# Patient Record
Sex: Female | Born: 1935 | Race: Black or African American | Hispanic: No | Marital: Single | Smoking: Never smoker
Health system: Southern US, Community
[De-identification: ages and names within clinical notes are randomized; demographics above are authoritative.]

## PROBLEM LIST (undated history)

## (undated) DIAGNOSIS — E7800 Pure hypercholesterolemia, unspecified: Secondary | ICD-10-CM

## (undated) DIAGNOSIS — N189 Chronic kidney disease, unspecified: Secondary | ICD-10-CM

## (undated) DIAGNOSIS — E119 Type 2 diabetes mellitus without complications: Secondary | ICD-10-CM

## (undated) DIAGNOSIS — I10 Essential (primary) hypertension: Secondary | ICD-10-CM

## (undated) DIAGNOSIS — M109 Gout, unspecified: Secondary | ICD-10-CM

## (undated) HISTORY — DX: Gout, unspecified: M10.9

## (undated) HISTORY — DX: Chronic kidney disease, unspecified: N18.9

## (undated) HISTORY — PX: TOTAL KNEE ARTHROPLASTY: SHX125

## (undated) HISTORY — PX: KNEE SURGERY: SHX244

---

## 2001-09-02 ENCOUNTER — Ambulatory Visit (HOSPITAL_COMMUNITY): Admission: RE | Admit: 2001-09-02 | Discharge: 2001-09-02 | Payer: Self-pay | Admitting: Ophthalmology

## 2003-10-15 ENCOUNTER — Inpatient Hospital Stay (HOSPITAL_COMMUNITY): Admission: RE | Admit: 2003-10-15 | Discharge: 2003-10-19 | Payer: Self-pay | Admitting: Orthopedic Surgery

## 2003-10-15 IMAGING — CR DG KNEE 1-2V PORT*L*
2 series · 2 of 2 positions shown · non-contrast
Comparison: none

CLINICAL DATA: Osteoarthritis of the left knee. Total knee replacement.

AP AND LATERAL PORTABLE VIEWS OF THE LEFT KNEE [DATE]

[view not recorded (1 of 2)]
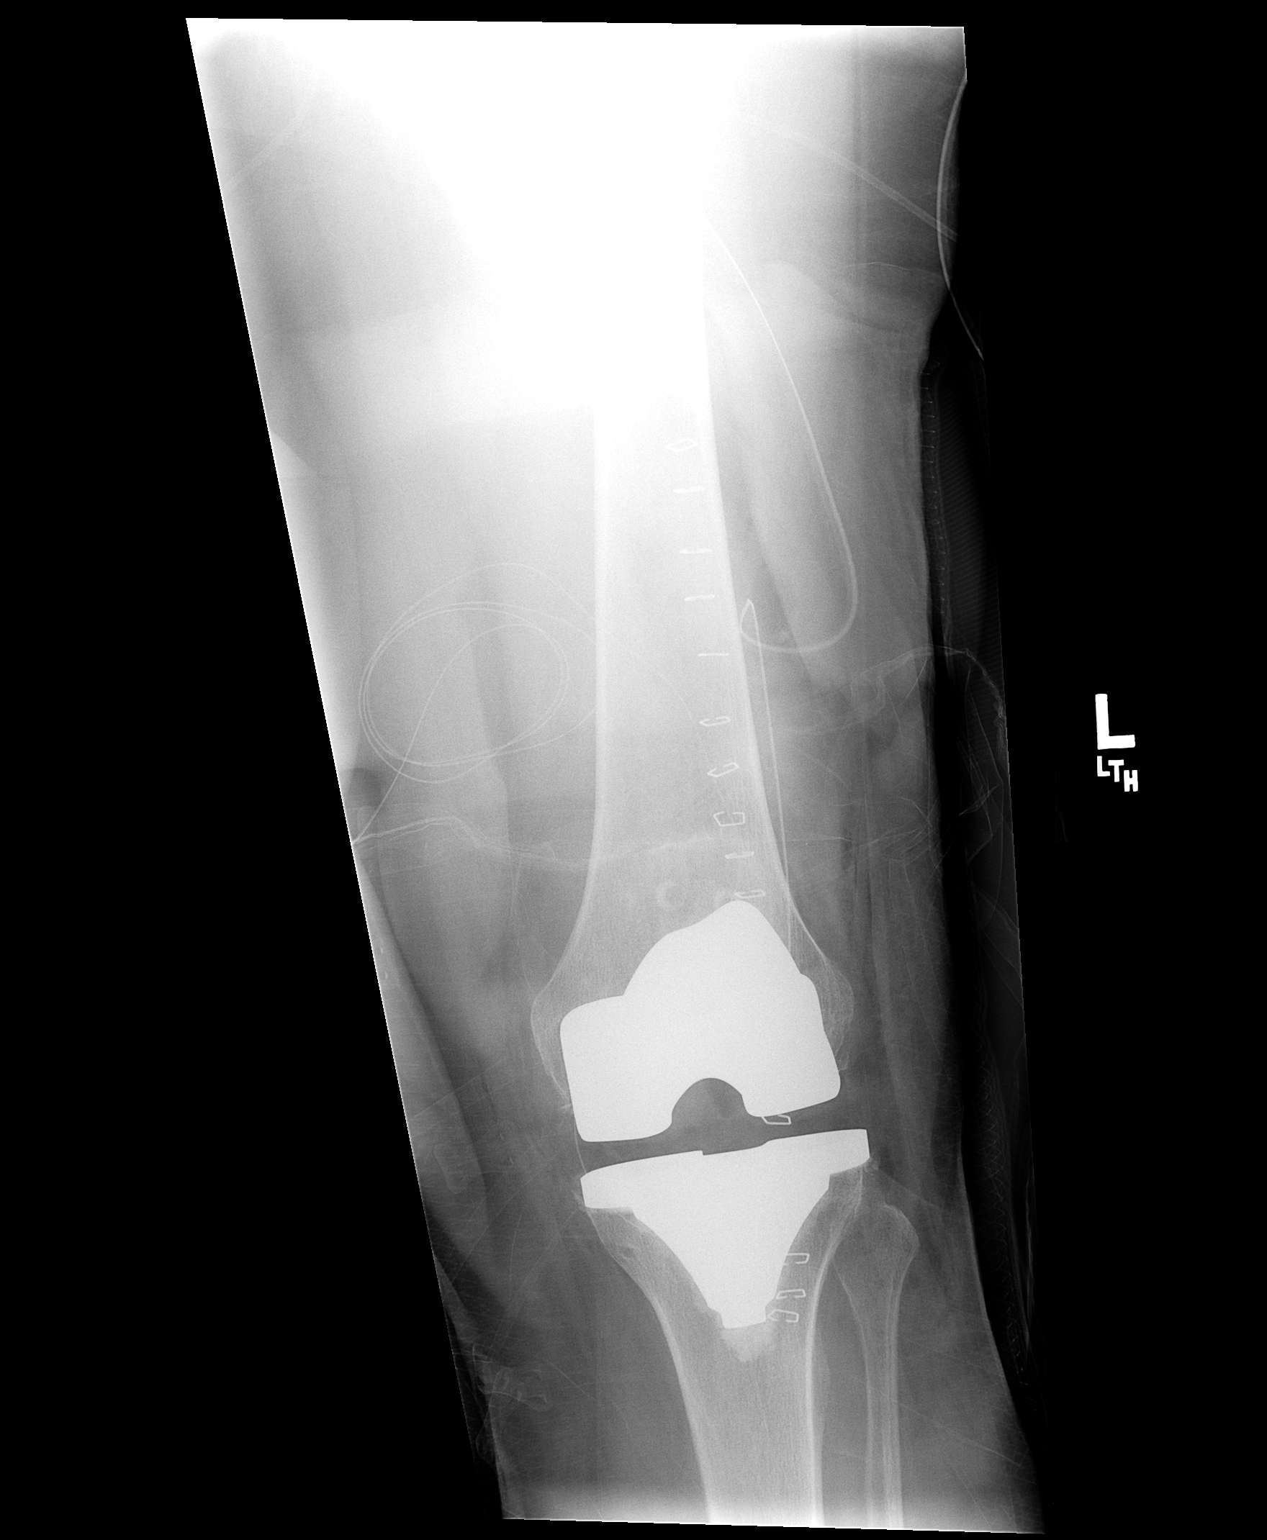

[view not recorded (2 of 2)]
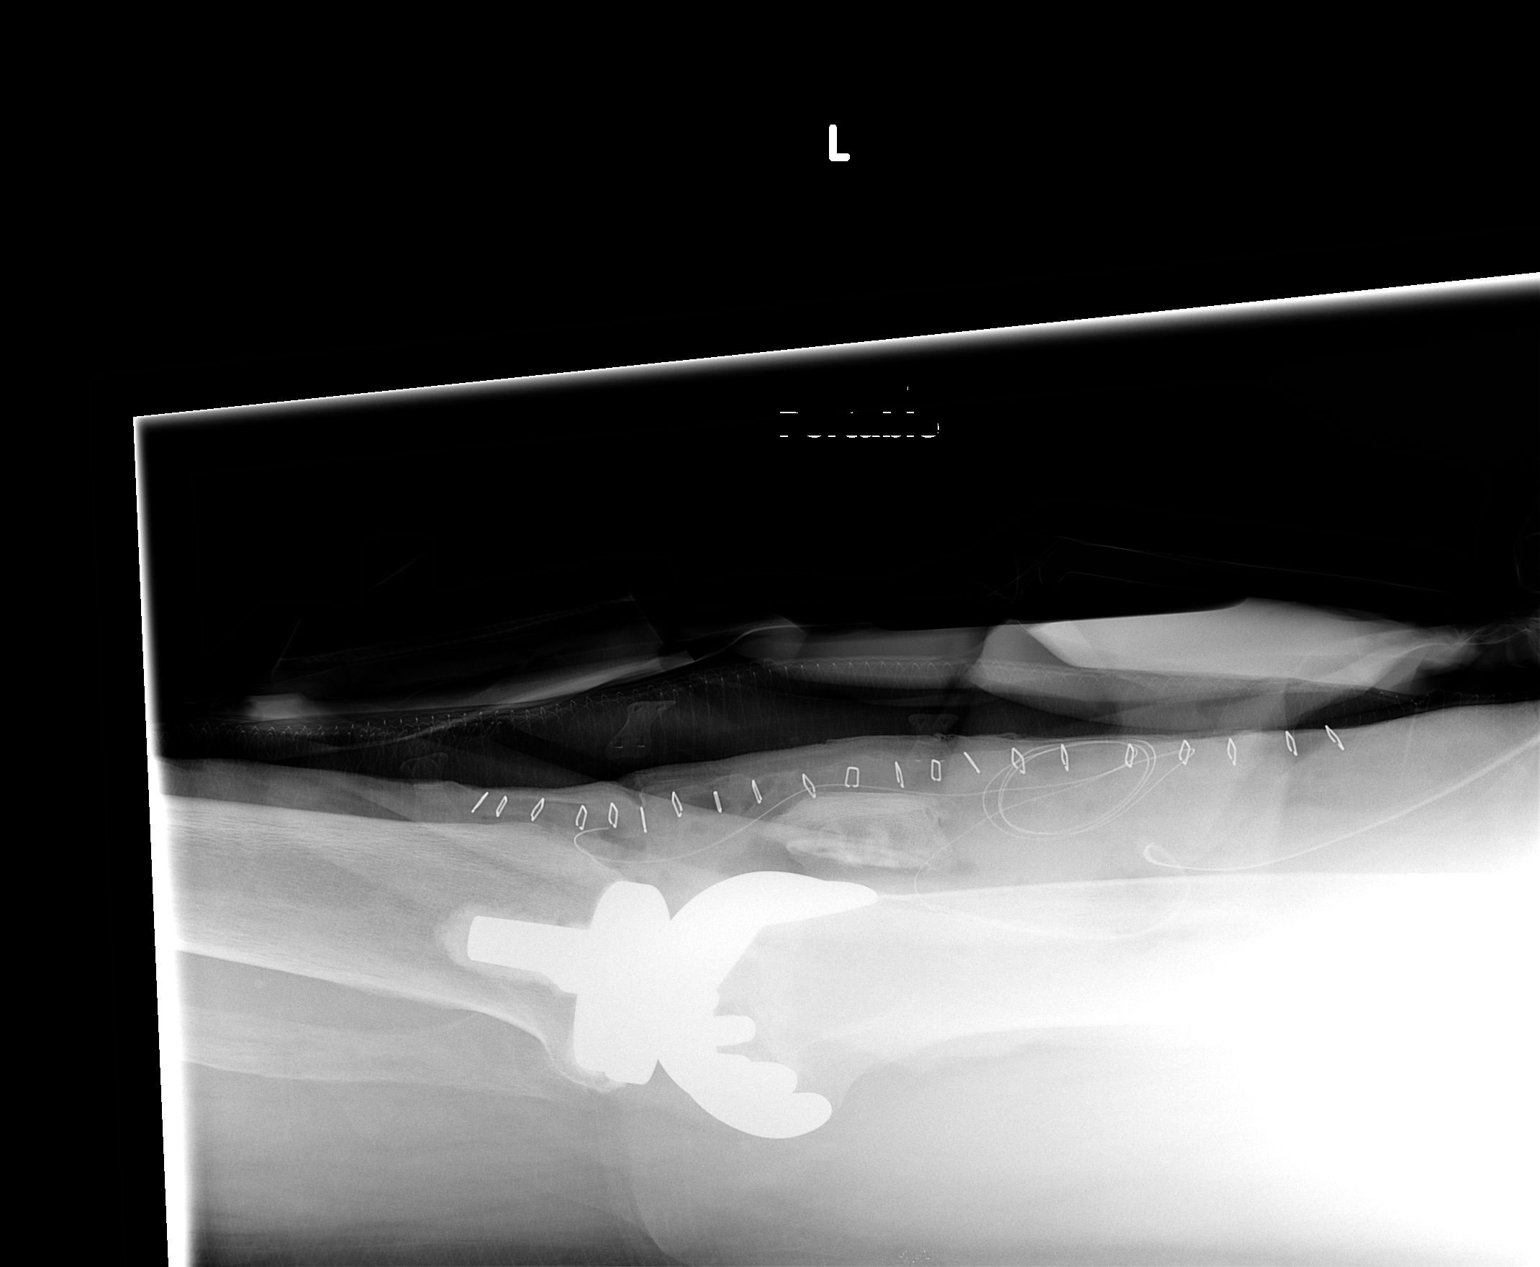

[2 of 2 positions shown; findings below may reference images not displayed]

FINDINGS: The components of the total knee replacement appear in good position with anatomic
alignment. Drains are in place.

IMPRESSION

Satisfactory postoperative appearance of the left knee after total knee replacement.

## 2003-11-25 ENCOUNTER — Ambulatory Visit: Payer: Self-pay | Admitting: Orthopedic Surgery

## 2004-01-06 ENCOUNTER — Ambulatory Visit: Payer: Self-pay | Admitting: Orthopedic Surgery

## 2004-03-30 ENCOUNTER — Ambulatory Visit: Payer: Self-pay | Admitting: Orthopedic Surgery

## 2004-05-29 ENCOUNTER — Ambulatory Visit (HOSPITAL_COMMUNITY): Admission: RE | Admit: 2004-05-29 | Discharge: 2004-05-29 | Payer: Self-pay | Admitting: Internal Medicine

## 2004-09-25 ENCOUNTER — Ambulatory Visit: Payer: Self-pay | Admitting: Orthopedic Surgery

## 2005-06-26 ENCOUNTER — Ambulatory Visit (HOSPITAL_COMMUNITY): Admission: RE | Admit: 2005-06-26 | Discharge: 2005-06-26 | Payer: Self-pay | Admitting: Ophthalmology

## 2014-01-27 DIAGNOSIS — E119 Type 2 diabetes mellitus without complications: Secondary | ICD-10-CM | POA: Diagnosis not present

## 2014-01-27 DIAGNOSIS — Z79899 Other long term (current) drug therapy: Secondary | ICD-10-CM | POA: Diagnosis not present

## 2014-02-02 DIAGNOSIS — H4011X1 Primary open-angle glaucoma, mild stage: Secondary | ICD-10-CM | POA: Diagnosis not present

## 2014-02-03 DIAGNOSIS — I10 Essential (primary) hypertension: Secondary | ICD-10-CM | POA: Diagnosis not present

## 2014-02-03 DIAGNOSIS — N183 Chronic kidney disease, stage 3 (moderate): Secondary | ICD-10-CM | POA: Diagnosis not present

## 2014-02-03 DIAGNOSIS — E1129 Type 2 diabetes mellitus with other diabetic kidney complication: Secondary | ICD-10-CM | POA: Diagnosis not present

## 2014-02-19 DIAGNOSIS — E1142 Type 2 diabetes mellitus with diabetic polyneuropathy: Secondary | ICD-10-CM | POA: Diagnosis not present

## 2014-02-19 DIAGNOSIS — B351 Tinea unguium: Secondary | ICD-10-CM | POA: Diagnosis not present

## 2014-02-19 DIAGNOSIS — L851 Acquired keratosis [keratoderma] palmaris et plantaris: Secondary | ICD-10-CM | POA: Diagnosis not present

## 2014-04-28 DIAGNOSIS — N189 Chronic kidney disease, unspecified: Secondary | ICD-10-CM | POA: Diagnosis not present

## 2014-04-28 DIAGNOSIS — I10 Essential (primary) hypertension: Secondary | ICD-10-CM | POA: Diagnosis not present

## 2014-04-28 DIAGNOSIS — Z79899 Other long term (current) drug therapy: Secondary | ICD-10-CM | POA: Diagnosis not present

## 2014-04-28 DIAGNOSIS — E119 Type 2 diabetes mellitus without complications: Secondary | ICD-10-CM | POA: Diagnosis not present

## 2014-04-28 DIAGNOSIS — M109 Gout, unspecified: Secondary | ICD-10-CM | POA: Diagnosis not present

## 2014-05-04 DIAGNOSIS — H4011X1 Primary open-angle glaucoma, mild stage: Secondary | ICD-10-CM | POA: Diagnosis not present

## 2014-05-07 ENCOUNTER — Other Ambulatory Visit (HOSPITAL_COMMUNITY): Payer: Self-pay | Admitting: Podiatry

## 2014-05-07 DIAGNOSIS — E1142 Type 2 diabetes mellitus with diabetic polyneuropathy: Secondary | ICD-10-CM | POA: Diagnosis not present

## 2014-05-07 DIAGNOSIS — R0989 Other specified symptoms and signs involving the circulatory and respiratory systems: Secondary | ICD-10-CM

## 2014-05-07 DIAGNOSIS — L851 Acquired keratosis [keratoderma] palmaris et plantaris: Secondary | ICD-10-CM | POA: Diagnosis not present

## 2014-05-07 DIAGNOSIS — B351 Tinea unguium: Secondary | ICD-10-CM | POA: Diagnosis not present

## 2014-05-07 DIAGNOSIS — B353 Tinea pedis: Secondary | ICD-10-CM | POA: Diagnosis not present

## 2014-05-11 ENCOUNTER — Ambulatory Visit (HOSPITAL_COMMUNITY)
Admission: RE | Admit: 2014-05-11 | Discharge: 2014-05-11 | Disposition: A | Discharge status: Home / Self Care | Payer: Medicare Other | Source: Ambulatory Visit | Attending: Podiatry | Admitting: Podiatry

## 2014-05-11 DIAGNOSIS — M79672 Pain in left foot: Secondary | ICD-10-CM | POA: Diagnosis not present

## 2014-05-11 DIAGNOSIS — M79604 Pain in right leg: Secondary | ICD-10-CM | POA: Diagnosis not present

## 2014-05-11 DIAGNOSIS — R0989 Other specified symptoms and signs involving the circulatory and respiratory systems: Secondary | ICD-10-CM | POA: Diagnosis not present

## 2014-05-11 DIAGNOSIS — E119 Type 2 diabetes mellitus without complications: Secondary | ICD-10-CM | POA: Diagnosis not present

## 2014-05-11 DIAGNOSIS — M79661 Pain in right lower leg: Secondary | ICD-10-CM | POA: Diagnosis not present

## 2014-05-11 DIAGNOSIS — M79605 Pain in left leg: Secondary | ICD-10-CM | POA: Diagnosis not present

## 2014-05-11 DIAGNOSIS — I10 Essential (primary) hypertension: Secondary | ICD-10-CM | POA: Insufficient documentation

## 2014-05-11 DIAGNOSIS — M79671 Pain in right foot: Secondary | ICD-10-CM | POA: Diagnosis not present

## 2014-05-11 DIAGNOSIS — M25561 Pain in right knee: Secondary | ICD-10-CM | POA: Diagnosis not present

## 2014-05-12 DIAGNOSIS — N183 Chronic kidney disease, stage 3 (moderate): Secondary | ICD-10-CM | POA: Diagnosis not present

## 2014-05-12 DIAGNOSIS — I10 Essential (primary) hypertension: Secondary | ICD-10-CM | POA: Diagnosis not present

## 2014-05-12 DIAGNOSIS — E1129 Type 2 diabetes mellitus with other diabetic kidney complication: Secondary | ICD-10-CM | POA: Diagnosis not present

## 2014-07-16 DIAGNOSIS — L851 Acquired keratosis [keratoderma] palmaris et plantaris: Secondary | ICD-10-CM | POA: Diagnosis not present

## 2014-07-16 DIAGNOSIS — B351 Tinea unguium: Secondary | ICD-10-CM | POA: Diagnosis not present

## 2014-07-16 DIAGNOSIS — E1142 Type 2 diabetes mellitus with diabetic polyneuropathy: Secondary | ICD-10-CM | POA: Diagnosis not present

## 2014-08-03 DIAGNOSIS — H4011X1 Primary open-angle glaucoma, mild stage: Secondary | ICD-10-CM | POA: Diagnosis not present

## 2014-08-03 DIAGNOSIS — E119 Type 2 diabetes mellitus without complications: Secondary | ICD-10-CM | POA: Diagnosis not present

## 2014-08-11 DIAGNOSIS — E119 Type 2 diabetes mellitus without complications: Secondary | ICD-10-CM | POA: Diagnosis not present

## 2014-08-11 DIAGNOSIS — E785 Hyperlipidemia, unspecified: Secondary | ICD-10-CM | POA: Diagnosis not present

## 2014-08-11 DIAGNOSIS — I10 Essential (primary) hypertension: Secondary | ICD-10-CM | POA: Diagnosis not present

## 2014-08-11 DIAGNOSIS — N183 Chronic kidney disease, stage 3 (moderate): Secondary | ICD-10-CM | POA: Diagnosis not present

## 2014-08-18 DIAGNOSIS — N183 Chronic kidney disease, stage 3 (moderate): Secondary | ICD-10-CM | POA: Diagnosis not present

## 2014-08-18 DIAGNOSIS — I10 Essential (primary) hypertension: Secondary | ICD-10-CM | POA: Diagnosis not present

## 2014-08-18 DIAGNOSIS — Z6830 Body mass index (BMI) 30.0-30.9, adult: Secondary | ICD-10-CM | POA: Diagnosis not present

## 2014-08-18 DIAGNOSIS — E119 Type 2 diabetes mellitus without complications: Secondary | ICD-10-CM | POA: Diagnosis not present

## 2014-09-24 DIAGNOSIS — L851 Acquired keratosis [keratoderma] palmaris et plantaris: Secondary | ICD-10-CM | POA: Diagnosis not present

## 2014-09-24 DIAGNOSIS — E1142 Type 2 diabetes mellitus with diabetic polyneuropathy: Secondary | ICD-10-CM | POA: Diagnosis not present

## 2014-09-24 DIAGNOSIS — B351 Tinea unguium: Secondary | ICD-10-CM | POA: Diagnosis not present

## 2014-11-02 DIAGNOSIS — H401131 Primary open-angle glaucoma, bilateral, mild stage: Secondary | ICD-10-CM | POA: Diagnosis not present

## 2014-11-17 DIAGNOSIS — Z79899 Other long term (current) drug therapy: Secondary | ICD-10-CM | POA: Diagnosis not present

## 2014-11-17 DIAGNOSIS — M109 Gout, unspecified: Secondary | ICD-10-CM | POA: Diagnosis not present

## 2014-11-17 DIAGNOSIS — E119 Type 2 diabetes mellitus without complications: Secondary | ICD-10-CM | POA: Diagnosis not present

## 2014-11-24 DIAGNOSIS — N183 Chronic kidney disease, stage 3 (moderate): Secondary | ICD-10-CM | POA: Diagnosis not present

## 2014-11-24 DIAGNOSIS — E1129 Type 2 diabetes mellitus with other diabetic kidney complication: Secondary | ICD-10-CM | POA: Diagnosis not present

## 2014-11-24 DIAGNOSIS — M1000 Idiopathic gout, unspecified site: Secondary | ICD-10-CM | POA: Diagnosis not present

## 2014-11-24 DIAGNOSIS — Z6832 Body mass index (BMI) 32.0-32.9, adult: Secondary | ICD-10-CM | POA: Diagnosis not present

## 2014-12-14 DIAGNOSIS — L851 Acquired keratosis [keratoderma] palmaris et plantaris: Secondary | ICD-10-CM | POA: Diagnosis not present

## 2014-12-14 DIAGNOSIS — E1142 Type 2 diabetes mellitus with diabetic polyneuropathy: Secondary | ICD-10-CM | POA: Diagnosis not present

## 2014-12-14 DIAGNOSIS — B351 Tinea unguium: Secondary | ICD-10-CM | POA: Diagnosis not present

## 2015-02-08 DIAGNOSIS — H401131 Primary open-angle glaucoma, bilateral, mild stage: Secondary | ICD-10-CM | POA: Diagnosis not present

## 2015-02-21 DIAGNOSIS — E119 Type 2 diabetes mellitus without complications: Secondary | ICD-10-CM | POA: Diagnosis not present

## 2015-02-21 DIAGNOSIS — Z79899 Other long term (current) drug therapy: Secondary | ICD-10-CM | POA: Diagnosis not present

## 2015-03-01 DIAGNOSIS — N183 Chronic kidney disease, stage 3 (moderate): Secondary | ICD-10-CM | POA: Diagnosis not present

## 2015-03-01 DIAGNOSIS — E119 Type 2 diabetes mellitus without complications: Secondary | ICD-10-CM | POA: Diagnosis not present

## 2015-03-01 DIAGNOSIS — I10 Essential (primary) hypertension: Secondary | ICD-10-CM | POA: Diagnosis not present

## 2015-03-04 DIAGNOSIS — L851 Acquired keratosis [keratoderma] palmaris et plantaris: Secondary | ICD-10-CM | POA: Diagnosis not present

## 2015-03-04 DIAGNOSIS — E1142 Type 2 diabetes mellitus with diabetic polyneuropathy: Secondary | ICD-10-CM | POA: Diagnosis not present

## 2015-03-04 DIAGNOSIS — B351 Tinea unguium: Secondary | ICD-10-CM | POA: Diagnosis not present

## 2015-04-27 ENCOUNTER — Emergency Department (HOSPITAL_COMMUNITY)
Admission: EM | Admit: 2015-04-27 | Discharge: 2015-04-27 | Disposition: A | Discharge status: Home / Self Care | Payer: Medicare Other | Attending: Emergency Medicine | Admitting: Emergency Medicine

## 2015-04-27 ENCOUNTER — Encounter (HOSPITAL_COMMUNITY): Payer: Self-pay | Admitting: Emergency Medicine

## 2015-04-27 ENCOUNTER — Emergency Department (HOSPITAL_COMMUNITY): Payer: Medicare Other

## 2015-04-27 DIAGNOSIS — Z7982 Long term (current) use of aspirin: Secondary | ICD-10-CM | POA: Insufficient documentation

## 2015-04-27 DIAGNOSIS — Z7984 Long term (current) use of oral hypoglycemic drugs: Secondary | ICD-10-CM | POA: Insufficient documentation

## 2015-04-27 DIAGNOSIS — Z79899 Other long term (current) drug therapy: Secondary | ICD-10-CM | POA: Diagnosis not present

## 2015-04-27 DIAGNOSIS — R531 Weakness: Secondary | ICD-10-CM | POA: Diagnosis not present

## 2015-04-27 DIAGNOSIS — E119 Type 2 diabetes mellitus without complications: Secondary | ICD-10-CM | POA: Insufficient documentation

## 2015-04-27 DIAGNOSIS — N289 Disorder of kidney and ureter, unspecified: Secondary | ICD-10-CM | POA: Insufficient documentation

## 2015-04-27 HISTORY — DX: Type 2 diabetes mellitus without complications: E11.9

## 2015-04-27 LAB — URINE MICROSCOPIC-ADD ON: RBC / HPF: NONE SEEN RBC/hpf (ref 0–5)

## 2015-04-27 LAB — BASIC METABOLIC PANEL
Anion gap: 10 (ref 5–15)
BUN: 24 mg/dL — AB (ref 6–20)
CHLORIDE: 102 mmol/L (ref 101–111)
CO2: 25 mmol/L (ref 22–32)
CREATININE: 2.34 mg/dL — AB (ref 0.44–1.00)
Calcium: 8 mg/dL — ABNORMAL LOW (ref 8.9–10.3)
GFR calc Af Amer: 21 mL/min — ABNORMAL LOW (ref >60)
GFR calc non Af Amer: 18 mL/min — ABNORMAL LOW (ref >60)
GLUCOSE: 112 mg/dL — AB (ref 65–99)
Potassium: 3.7 mmol/L (ref 3.5–5.1)
SODIUM: 137 mmol/L (ref 135–145)

## 2015-04-27 LAB — CBC
HEMATOCRIT: 31.2 % — AB (ref 36.0–46.0)
Hemoglobin: 10.4 g/dL — ABNORMAL LOW (ref 12.0–15.0)
MCH: 29.7 pg (ref 26.0–34.0)
MCHC: 33.3 g/dL (ref 30.0–36.0)
MCV: 89.1 fL (ref 78.0–100.0)
PLATELETS: 382 10*3/uL (ref 150–400)
RBC: 3.5 MIL/uL — ABNORMAL LOW (ref 3.87–5.11)
RDW: 14 % (ref 11.5–15.5)
WBC: 7 10*3/uL (ref 4.0–10.5)

## 2015-04-27 LAB — URINALYSIS, ROUTINE W REFLEX MICROSCOPIC
BILIRUBIN URINE: NEGATIVE
GLUCOSE, UA: NEGATIVE mg/dL
HGB URINE DIPSTICK: NEGATIVE
KETONES UR: NEGATIVE mg/dL
Nitrite: NEGATIVE
PH: 7 (ref 5.0–8.0)
PROTEIN: NEGATIVE mg/dL
Specific Gravity, Urine: <1.005 — ABNORMAL LOW (ref 1.005–1.030)

## 2015-04-27 LAB — CBG MONITORING, ED: Glucose-Capillary: 96 mg/dL (ref 65–99)

## 2015-04-27 IMAGING — DX DG CHEST 2V
2 series · 2 of 2 positions shown · non-contrast
Comparison: None.

CLINICAL DATA: Weakness, chills, and hot spells

EXAM:
CHEST  2 VIEW

[chest pa]
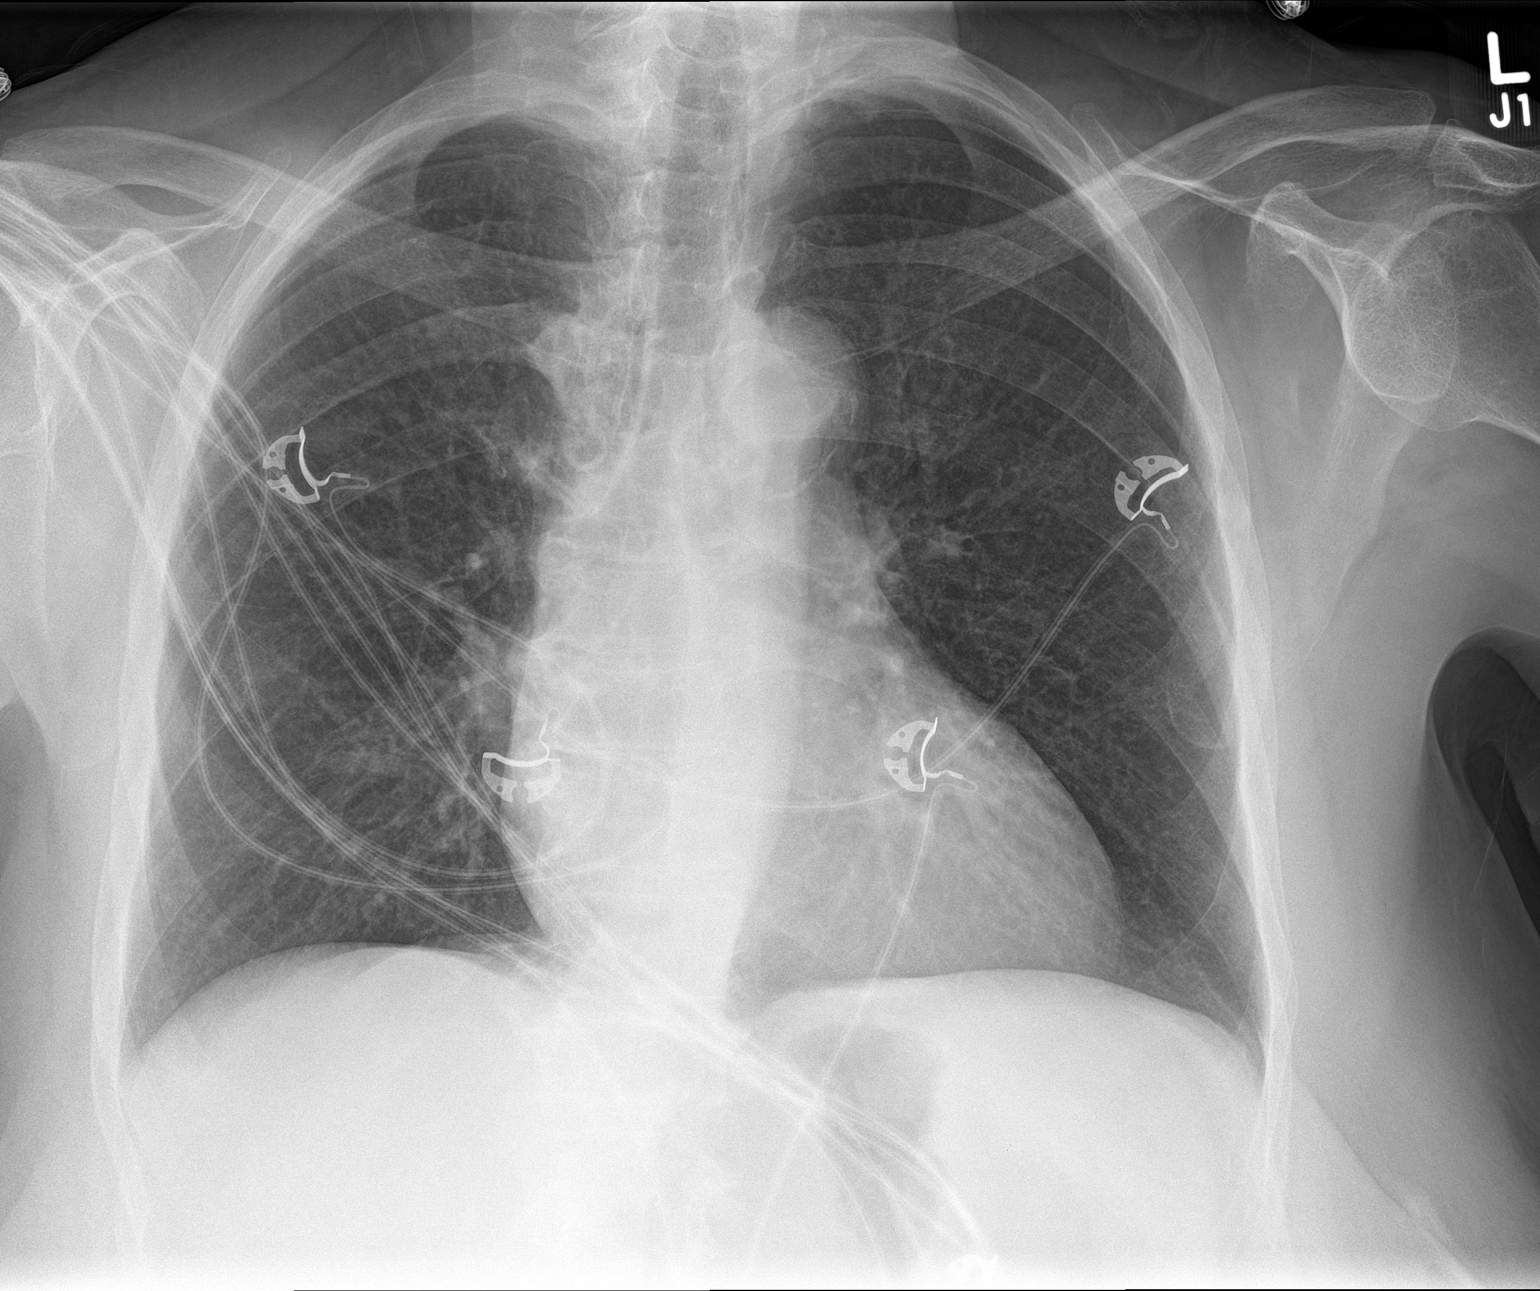

[chest lat]
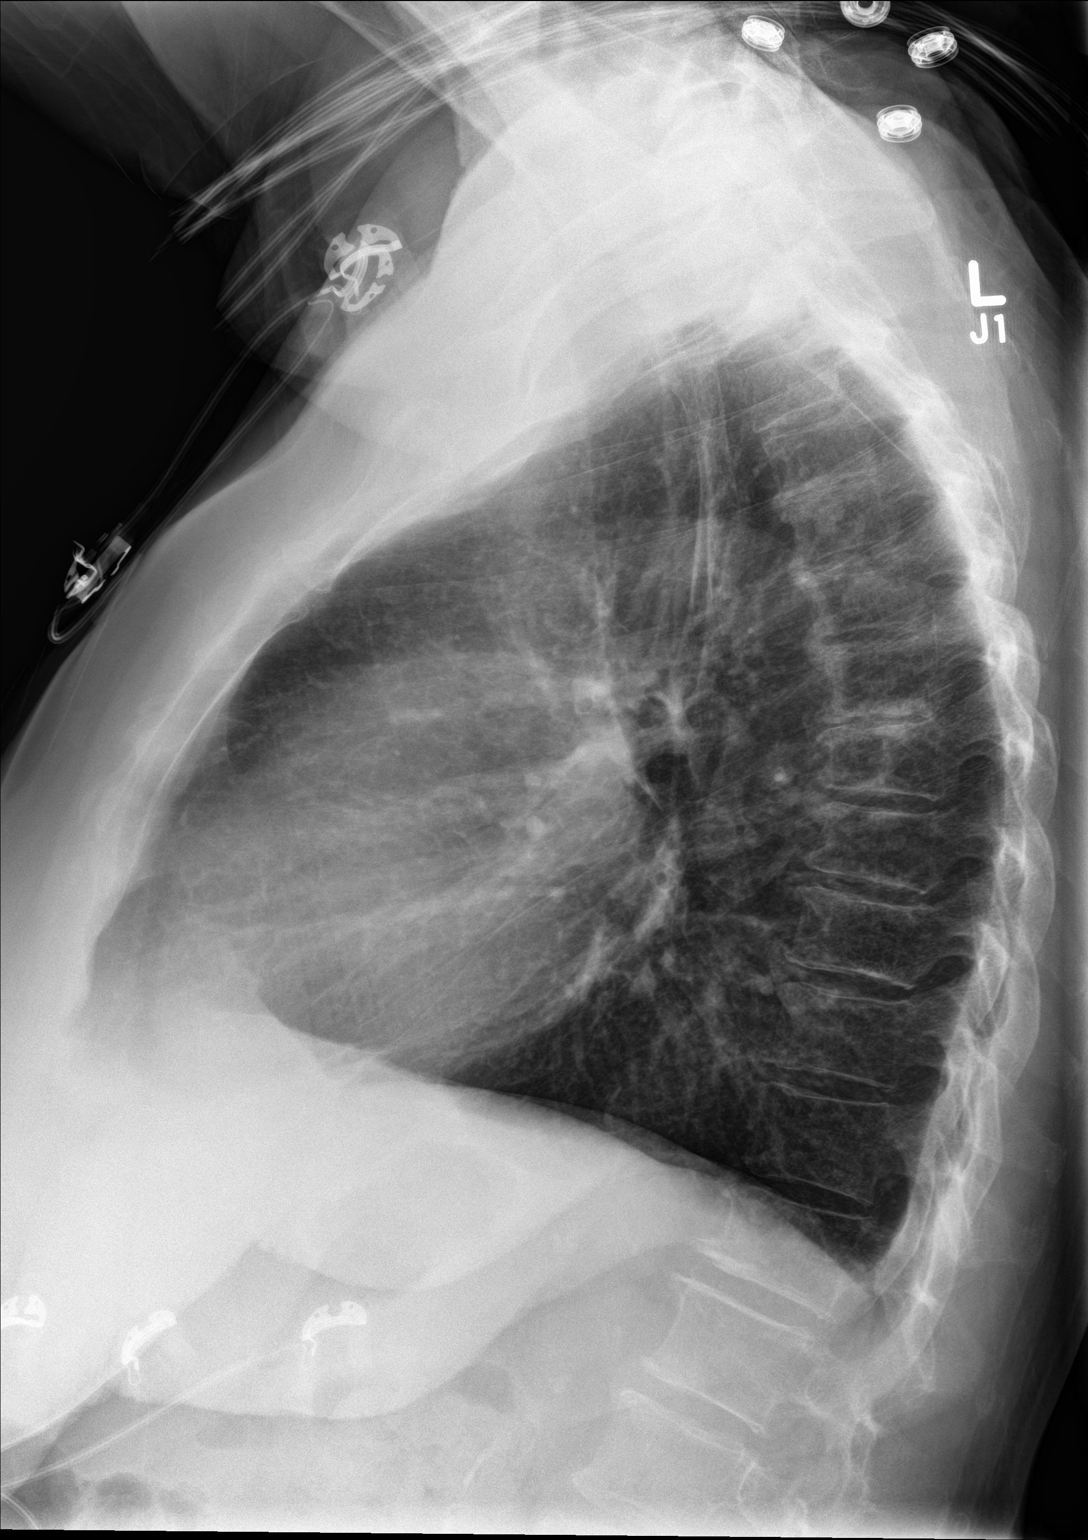

[2 of 2 positions shown; findings below may reference images not displayed]

FINDINGS: Normal heart size and mediastinal contours. No acute infiltrate or
edema. No effusion or pneumothorax. No acute osseous findings.
IMPRESSION: No active cardiopulmonary disease.

## 2015-04-27 NOTE — Discharge Instructions (Signed)
Labs show evidence of some renal insufficiency, kidneys not working as well as they used to. Recommend follow-up with your primary care Dr. for recheck of kidney function. Rest of workup negative. Return for any new or worse symptoms. Make an appointment to follow-up with Dr. Willey Blade.

## 2015-04-27 NOTE — ED Notes (Signed)
Pt c/o weakness, chills and "feeling hot" x 2 weeks. Pt denies chest pain or SOB. Pt reports dizziness when she gets up to walk.

## 2015-04-27 NOTE — ED Provider Notes (Signed)
CSN: [id number]     Arrival date & time 04/27/15  1049 History   First MD Initiated Contact with Patient 04/27/15 1202     Chief Complaint  Patient presents with  . Weakness     (Consider location/radiation/quality/duration/timing/severity/associated sxs/prior Treatment) Patient is a 82 y.o. female presenting with weakness. The history is provided by the patient.  Weakness Pertinent negatives include no chest pain, no abdominal pain, no headaches and no shortness of breath.   patient with a two-week history of feeling chills and hot at times generalized weakness no chest pain no shortness of breath. Some mild dizziness at times when she gets up to walk. No severe dizziness no vertigo. Denies headache.  Past Medical History  Diagnosis Date  . Diabetes mellitus without complication ([place])    History reviewed. No pertinent past surgical history. Family History  Problem Relation Age of Onset  . Diabetes Mother   . Hypertension Father    Social History  Substance Use Topics  . Smoking status: Never Smoker   . Smokeless tobacco: Never Used  . Alcohol Use: No   OB History    Gravida Para Term Preterm AB TAB SAB Ectopic Multiple Living   5 5 5             Review of Systems  Constitutional: Positive for fever and chills.  HENT: Negative for congestion.   Eyes: Negative for visual disturbance.  Respiratory: Negative for shortness of breath.   Cardiovascular: Negative for chest pain.  Gastrointestinal: Negative for nausea, vomiting, abdominal pain and diarrhea.  Genitourinary: Negative for dysuria.  Musculoskeletal: Negative for back pain.  Skin: Negative for rash.  Neurological: Positive for dizziness and weakness. Negative for headaches.  Hematological: Does not bruise/bleed easily.  Psychiatric/Behavioral: Negative for confusion.      Allergies  Other  Home Medications   Prior to Admission medications   Medication Sig Start Date End Date Taking? Authorizing Provider   amLODipine (NORVASC) 10 MG tablet Take 10 mg by mouth daily.   Yes Historical Provider, MD  aspirin EC 81 MG tablet Take 81 mg by mouth daily.   Yes Historical Provider, MD  atorvastatin (LIPITOR) 80 MG tablet Take 80 mg by mouth daily at 6 PM.   Yes Historical Provider, MD  labetalol (NORMODYNE) 100 MG tablet Take 100 mg by mouth 2 (two) times daily.   Yes Historical Provider, MD  losartan-hydrochlorothiazide (HYZAAR) 100-25 MG tablet Take 1 tablet by mouth daily.   Yes Historical Provider, MD  metFORMIN (GLUCOPHAGE-XR) 500 MG 24 hr tablet Take 500 mg by mouth 2 (two) times daily.   Yes Historical Provider, MD   BP 127/51 mmHg  Pulse 75  Temp(Src) 99.1 F (37.3 C) (Oral)  Resp 16  Ht 5\' 6"  (1.676 m)  Wt 88.905 kg  BMI 31.65 kg/m2  SpO2 100% Physical Exam  Constitutional: She is oriented to person, place, and time. She appears well-developed and well-nourished. No distress.  HENT:  Head: Normocephalic and atraumatic.  Mouth/Throat: Oropharynx is clear and moist.  Eyes: Conjunctivae and EOM are normal. Pupils are equal, round, and reactive to light.  Neck: Normal range of motion. Neck supple.  Cardiovascular: Normal rate, regular rhythm and normal heart sounds.   No murmur heard. Pulmonary/Chest: Effort normal and breath sounds normal. No respiratory distress. She has no wheezes. She has no rales.  Abdominal: Soft. Bowel sounds are normal. There is no tenderness.  Musculoskeletal: Normal range of motion. She exhibits no edema.  Neurological: She  is alert and oriented to person, place, and time. No cranial nerve deficit. She exhibits normal muscle tone. Coordination normal.  Coordination intact negative pronator drift. Patient without any obvious focal neuro deficit on exam.  Skin: Skin is warm.  Nursing note and vitals reviewed.   ED Course  Procedures (including critical care time) Labs Review Labs Reviewed  BASIC METABOLIC PANEL - Abnormal; Notable for the following:     Glucose, Bld 112 (*)    BUN 24 (*)    Creatinine, Ser 2.34 (*)    Calcium 8.0 (*)    GFR calc non Af Amer 18 (*)    GFR calc Af Amer 21 (*)    All other components within normal limits  CBC - Abnormal; Notable for the following:    RBC 3.50 (*)    Hemoglobin 10.4 (*)    HCT 31.2 (*)    All other components within normal limits  URINALYSIS, ROUTINE W REFLEX MICROSCOPIC (NOT AT [facility]) - Abnormal; Notable for the following:    Specific Gravity, Urine <1.005 (*)    Leukocytes, UA SMALL (*)    All other components within normal limits  URINE MICROSCOPIC-ADD ON - Abnormal; Notable for the following:    Squamous Epithelial / LPF 0-5 (*)    Bacteria, UA FEW (*)    All other components within normal limits  CBG MONITORING, ED   Results for orders placed or performed during the hospital encounter of [id number]  Basic metabolic panel  Result Value Ref Range   Sodium 137 135 - 145 mmol/L   Potassium 3.7 3.5 - 5.1 mmol/L   Chloride 102 101 - 111 mmol/L   CO2 25 22 - 32 mmol/L   Glucose, Bld 112 (H) 65 - 99 mg/dL   BUN 24 (H) 6 - 20 mg/dL   Creatinine, Ser 2.34 (H) 0.44 - 1.00 mg/dL   Calcium 8.0 (L) 8.9 - 10.3 mg/dL   GFR calc non Af Amer 18 (L) >60 mL/min   GFR calc Af Amer 21 (L) >60 mL/min   Anion gap 10 5 - 15  CBC  Result Value Ref Range   WBC 7.0 4.0 - 10.5 K/uL   RBC 3.50 (L) 3.87 - 5.11 MIL/uL   Hemoglobin 10.4 (L) 12.0 - 15.0 g/dL   HCT 31.2 (L) 36.0 - 46.0 %   MCV 89.1 78.0 - 100.0 fL   MCH 29.7 26.0 - 34.0 pg   MCHC 33.3 30.0 - 36.0 g/dL   RDW 14.0 11.5 - 15.5 %   Platelets 382 150 - 400 K/uL  Urinalysis, Routine w reflex microscopic (not at [facility])  Result Value Ref Range   Color, Urine YELLOW YELLOW   APPearance CLEAR CLEAR   Specific Gravity, Urine <1.005 (L) 1.005 - 1.030   pH 7.0 5.0 - 8.0   Glucose, UA NEGATIVE NEGATIVE mg/dL   Hgb urine dipstick NEGATIVE NEGATIVE   Bilirubin Urine NEGATIVE NEGATIVE   Ketones, ur NEGATIVE NEGATIVE mg/dL   Protein, ur NEGATIVE  NEGATIVE mg/dL   Nitrite NEGATIVE NEGATIVE   Leukocytes, UA SMALL (A) NEGATIVE  Urine microscopic-add on  Result Value Ref Range   Squamous Epithelial / LPF 0-5 (A) NONE SEEN   WBC, UA 0-5 0 - 5 WBC/hpf   RBC / HPF NONE SEEN 0 - 5 RBC/hpf   Bacteria, UA FEW (A) NONE SEEN  CBG monitoring, ED  Result Value Ref Range   Glucose-Capillary 96 65 - 99 mg/dL     Imaging  Review Dg Chest 2 View  04/27/2015  CLINICAL DATA:  Weakness, chills, and hot spells EXAM: CHEST  2 VIEW COMPARISON:  None. FINDINGS: Normal heart size and mediastinal contours. No acute infiltrate or edema. No effusion or pneumothorax. No acute osseous findings. IMPRESSION: No active cardiopulmonary disease. Electronically Signed   By: Monte Fantasia M.D.   On: 04/27/2015 12:26   I have personally reviewed and evaluated these images and lab results as part of my medical decision-making.   EKG Interpretation   Date/Time:  Wednesday April 27 2015 10:58:45 EDT Ventricular Rate:  72 PR Interval:  214 QRS Duration: 113 QT Interval:  392 QTC Calculation: 429 R Axis:   -50 Text Interpretation:  Sinus or ectopic atrial rhythm Borderline prolonged  PR interval LAD, consider left anterior fascicular block LVH with  secondary repolarization abnormality Anterior Q waves, possibly due to LVH  No previous ECGs available Confirmed by Calleen Alvis  MD, Jdyn Parkerson ([id number]) on  04/27/2015 11:09:51 AM      MDM   Final diagnoses:  Weakness  Renal insufficiency    Patient symptoms are without specifics. Generalized weakness feeling of chills sometimes hot. Urinalysis negative for urinary tract infection. No leukocytosis no significant anemia. Electrolytes do show evidence of renal insufficiency but potassium is normal. Chest x-rays also negative for pneumonia pneumothorax or pulmonary edema. Recommend close follow-up with primary care doctor. No indications for admission. Patient's neuro exam without any vocal deficits. Patient without any  vertigo. Dizziness she describes seems to be mild and intermittent..  Patient stable for discharge home.    Fredia Sorrow, MD 04/27/15 1348

## 2015-05-13 DIAGNOSIS — L851 Acquired keratosis [keratoderma] palmaris et plantaris: Secondary | ICD-10-CM | POA: Diagnosis not present

## 2015-05-13 DIAGNOSIS — E1142 Type 2 diabetes mellitus with diabetic polyneuropathy: Secondary | ICD-10-CM | POA: Diagnosis not present

## 2015-05-13 DIAGNOSIS — B351 Tinea unguium: Secondary | ICD-10-CM | POA: Diagnosis not present

## 2015-05-17 DIAGNOSIS — H401131 Primary open-angle glaucoma, bilateral, mild stage: Secondary | ICD-10-CM | POA: Diagnosis not present

## 2015-05-27 DIAGNOSIS — E785 Hyperlipidemia, unspecified: Secondary | ICD-10-CM | POA: Diagnosis not present

## 2015-05-27 DIAGNOSIS — N189 Chronic kidney disease, unspecified: Secondary | ICD-10-CM | POA: Diagnosis not present

## 2015-05-27 DIAGNOSIS — M109 Gout, unspecified: Secondary | ICD-10-CM | POA: Diagnosis not present

## 2015-05-27 DIAGNOSIS — E119 Type 2 diabetes mellitus without complications: Secondary | ICD-10-CM | POA: Diagnosis not present

## 2015-05-27 DIAGNOSIS — I10 Essential (primary) hypertension: Secondary | ICD-10-CM | POA: Diagnosis not present

## 2015-06-03 DIAGNOSIS — N183 Chronic kidney disease, stage 3 (moderate): Secondary | ICD-10-CM | POA: Diagnosis not present

## 2015-06-03 DIAGNOSIS — E119 Type 2 diabetes mellitus without complications: Secondary | ICD-10-CM | POA: Diagnosis not present

## 2015-06-03 DIAGNOSIS — E785 Hyperlipidemia, unspecified: Secondary | ICD-10-CM | POA: Diagnosis not present

## 2015-07-22 DIAGNOSIS — E1142 Type 2 diabetes mellitus with diabetic polyneuropathy: Secondary | ICD-10-CM | POA: Diagnosis not present

## 2015-07-22 DIAGNOSIS — L851 Acquired keratosis [keratoderma] palmaris et plantaris: Secondary | ICD-10-CM | POA: Diagnosis not present

## 2015-07-22 DIAGNOSIS — B351 Tinea unguium: Secondary | ICD-10-CM | POA: Diagnosis not present

## 2015-08-30 DIAGNOSIS — Z961 Presence of intraocular lens: Secondary | ICD-10-CM | POA: Diagnosis not present

## 2015-08-30 DIAGNOSIS — H401131 Primary open-angle glaucoma, bilateral, mild stage: Secondary | ICD-10-CM | POA: Diagnosis not present

## 2015-08-30 DIAGNOSIS — E119 Type 2 diabetes mellitus without complications: Secondary | ICD-10-CM | POA: Diagnosis not present

## 2015-08-31 DIAGNOSIS — E119 Type 2 diabetes mellitus without complications: Secondary | ICD-10-CM | POA: Diagnosis not present

## 2015-08-31 DIAGNOSIS — Z79899 Other long term (current) drug therapy: Secondary | ICD-10-CM | POA: Diagnosis not present

## 2015-09-21 DIAGNOSIS — E119 Type 2 diabetes mellitus without complications: Secondary | ICD-10-CM | POA: Diagnosis not present

## 2015-09-21 DIAGNOSIS — N183 Chronic kidney disease, stage 3 (moderate): Secondary | ICD-10-CM | POA: Diagnosis not present

## 2015-09-21 DIAGNOSIS — I10 Essential (primary) hypertension: Secondary | ICD-10-CM | POA: Diagnosis not present

## 2015-09-28 DIAGNOSIS — M79642 Pain in left hand: Secondary | ICD-10-CM | POA: Diagnosis not present

## 2015-09-28 DIAGNOSIS — G5602 Carpal tunnel syndrome, left upper limb: Secondary | ICD-10-CM | POA: Diagnosis not present

## 2015-09-28 DIAGNOSIS — G5601 Carpal tunnel syndrome, right upper limb: Secondary | ICD-10-CM | POA: Diagnosis not present

## 2015-09-28 DIAGNOSIS — M79641 Pain in right hand: Secondary | ICD-10-CM | POA: Diagnosis not present

## 2015-09-30 DIAGNOSIS — B351 Tinea unguium: Secondary | ICD-10-CM | POA: Diagnosis not present

## 2015-09-30 DIAGNOSIS — E1142 Type 2 diabetes mellitus with diabetic polyneuropathy: Secondary | ICD-10-CM | POA: Diagnosis not present

## 2015-10-12 DIAGNOSIS — M19041 Primary osteoarthritis, right hand: Secondary | ICD-10-CM | POA: Diagnosis not present

## 2015-10-12 DIAGNOSIS — M19042 Primary osteoarthritis, left hand: Secondary | ICD-10-CM | POA: Diagnosis not present

## 2015-12-06 DIAGNOSIS — H401131 Primary open-angle glaucoma, bilateral, mild stage: Secondary | ICD-10-CM | POA: Diagnosis not present

## 2015-12-14 DIAGNOSIS — N189 Chronic kidney disease, unspecified: Secondary | ICD-10-CM | POA: Diagnosis not present

## 2015-12-14 DIAGNOSIS — E119 Type 2 diabetes mellitus without complications: Secondary | ICD-10-CM | POA: Diagnosis not present

## 2015-12-14 DIAGNOSIS — M109 Gout, unspecified: Secondary | ICD-10-CM | POA: Diagnosis not present

## 2015-12-14 DIAGNOSIS — Z79899 Other long term (current) drug therapy: Secondary | ICD-10-CM | POA: Diagnosis not present

## 2015-12-14 DIAGNOSIS — I10 Essential (primary) hypertension: Secondary | ICD-10-CM | POA: Diagnosis not present

## 2015-12-16 DIAGNOSIS — B351 Tinea unguium: Secondary | ICD-10-CM | POA: Diagnosis not present

## 2015-12-16 DIAGNOSIS — E1142 Type 2 diabetes mellitus with diabetic polyneuropathy: Secondary | ICD-10-CM | POA: Diagnosis not present

## 2015-12-21 ENCOUNTER — Ambulatory Visit (INDEPENDENT_AMBULATORY_CARE_PROVIDER_SITE_OTHER): Payer: Medicare Other | Admitting: Orthopaedic Surgery

## 2015-12-21 ENCOUNTER — Encounter (INDEPENDENT_AMBULATORY_CARE_PROVIDER_SITE_OTHER): Payer: Self-pay | Admitting: Orthopaedic Surgery

## 2015-12-21 ENCOUNTER — Ambulatory Visit (INDEPENDENT_AMBULATORY_CARE_PROVIDER_SITE_OTHER): Payer: Self-pay | Admitting: Orthopaedic Surgery

## 2015-12-21 VITALS — BP 122/85 | HR 71 | Ht 66.0 in | Wt 189.0 lb

## 2015-12-21 DIAGNOSIS — E1122 Type 2 diabetes mellitus with diabetic chronic kidney disease: Secondary | ICD-10-CM | POA: Diagnosis not present

## 2015-12-21 DIAGNOSIS — M79641 Pain in right hand: Secondary | ICD-10-CM | POA: Diagnosis not present

## 2015-12-21 DIAGNOSIS — N183 Chronic kidney disease, stage 3 (moderate): Secondary | ICD-10-CM | POA: Diagnosis not present

## 2015-12-21 DIAGNOSIS — I10 Essential (primary) hypertension: Secondary | ICD-10-CM | POA: Diagnosis not present

## 2015-12-21 NOTE — Progress Notes (Signed)
   Office Visit Note   Patient: Debra [name]           Date of Birth: 1930-09-15           MRN: [id number] Visit Date: 12/21/2015              Requested by: Asencion Noble, MD [address] [place],  [address] PCP: Asencion Noble, MD   Assessment & Plan: Visit Diagnoses: Bilateral carpal tunnel syndrome right greater than left 1. Pain in right hand     Plan:volar splint right wrist. I will order EMGs and nerve conduction studies of both upper extremities and see her back thereafter  Follow-Up Instructions: Return for after EMG's.   Orders:  Orders Placed This Encounter  Procedures  . Ambulatory referral to Physical Medicine Rehab   No orders of the defined types were placed in this encounter.     Procedures: No procedures performed   Clinical Data: No additional findings.   Subjective: No chief complaint on file.   Ptr complaining of Right wrist pain  Debra [name] had an episode of acute onset of numbness and tingling still several weeks ago which resolved. When she was last here there was a suggestion that she had carpal tunnel syndrome.  Review of Systems   Objective: Vital Signs: BP 122/85   Pulse 71   Ht 5\' 6"  (1.676 m)   Wt 189 lb (85.7 kg)   BMI 30.51 kg/m   Physical Exam  Ortho Exam exam of right hand reveals no swelling of the digits. Neurovascular exam is intact. Good capillary refill to all fingers. No swelling. Good grip and release. Positive Tinel's over the median nerve. Some pain at base of thumb with a positive grind test  Specialty Comments:  No specialty comments available.  Imaging: No results found.   PMFS History: There are no active problems to display for this patient.  Past Medical History:  Diagnosis Date  . Diabetes mellitus without complication ([place])     Family History  Problem Relation Age of Onset  . Diabetes Mother   . Hypertension Father     No past surgical history on file. Social History    Occupational History  . Not on file.   Social History Main Topics  . Smoking status: Never Smoker  . Smokeless tobacco: Never Used  . Alcohol use No  . Drug use: No  . Sexual activity: Not on file

## 2016-01-06 ENCOUNTER — Encounter (INDEPENDENT_AMBULATORY_CARE_PROVIDER_SITE_OTHER): Payer: Self-pay | Admitting: Physical Medicine and Rehabilitation

## 2016-01-06 ENCOUNTER — Ambulatory Visit (INDEPENDENT_AMBULATORY_CARE_PROVIDER_SITE_OTHER): Payer: Medicare Other | Admitting: Physical Medicine and Rehabilitation

## 2016-01-06 DIAGNOSIS — R202 Paresthesia of skin: Secondary | ICD-10-CM | POA: Diagnosis not present

## 2016-01-06 NOTE — Progress Notes (Signed)
Debra [name] - 82 y.o. female MRN [id number]  Date of birth: [birth year]  Office Visit Note: Visit Date: 01/06/2016 PCP: Asencion Noble, MD Referred by: Asencion Noble, MD  Subjective: Chief Complaint  Patient presents with  . Right Hand - Numbness  . Left Hand - Numbness   HPI: Debra [name] is an 82 year old female who is diabetic and is right-hand dominant who reports chronic worsening severe bilateral hand numbness and tingling and soreness. This is right more than left. She does get a lot of cramping in her hands. She's had some difficulty holding objects. She does get nocturnal symptoms worse. She does have a positive flick sign where she does shake her hands when they're numb. She also has trouble opening jars and has a lot of pain in the thumbs bilaterally. Her symptoms are somewhat nondermatomal. It's hard to get a specific distribution of the numbness. She has not had prior electrodiagnostic studies. She does get some burning sensations in both feet. She does not carry a diagnosis of peripheral neuropathy. She denies any specific injury or frank radicular pain.    Right hand dominant Bilateral hand numbness and soreness right worse than left. Feel cramping pain in hands. Difficulty holding things.  Review of Systems  Constitutional: Negative for chills, fever, malaise/fatigue and weight loss.  HENT: Negative for hearing loss and sinus pain.   Eyes: Negative for blurred vision, double vision and photophobia.  Respiratory: Negative for cough and shortness of breath.   Cardiovascular: Negative for chest pain, palpitations and leg swelling.  Gastrointestinal: Negative for abdominal pain, nausea and vomiting.  Genitourinary: Negative for flank pain.  Musculoskeletal: Positive for back pain and joint pain. Negative for myalgias.  Skin: Negative for itching and rash.  Neurological: Positive for tingling and sensory change. Negative for tremors, focal weakness and weakness.    Endo/Heme/Allergies: Negative.   Psychiatric/Behavioral: Negative for depression.  All other systems reviewed and are negative.  Otherwise per HPI.  Assessment & Plan: Visit Diagnoses:  1. Paresthesia of skin     Plan: Findings:  Impression: The above electrodiagnostic study is ABNORMAL and reveals evidence of: 1.  A moderate to severe right median nerve entrapment at the wrist (carpal tunnel syndrome) affecting sensory and motor components.   2. A moderate left median nerve entrapment at the wrist (carpal tunnel syndrome) affecting sensory and motor components.   3. 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   4.  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   *This test is difficult to interpret due to the concomitant peripheral neuropathy, however, the nerve conductions do drop across the elbow more than showing a distal slowing only which would go along more with entrapment syndromes versus just polyneuropathy.  Recommendations: 1.  Follow-up with referring physician. 2.  Continue current management of symptoms. 3.  Suggest surgical evaluation. Clinical correlation with her symptoms is paramount     Meds & Orders: No orders of the defined types were placed in this encounter.   Orders Placed This Encounter  Procedures  . NCV with EMG (electromyography)    Follow-up: No Follow-up on file.   Procedures: No procedures performed  EMG & NCV Findings: Evaluation of the left median motor and the right median motor nerves showed prolonged distal onset latency (L6.0, R6.3 ms) and decreased conduction velocity (Elbow-Wrist, L41, R43 m/s).  The left ulnar motor and the right ulnar motor nerves showed decreased  conduction velocity (A Elbow-B Elbow, L38, R36 m/s).  The left median (across palm) sensory nerve showed prolonged distal peak latency (Wrist, 6.1  ms), reduced amplitude (4.4 V), and prolonged distal peak latency (Palm, 3.2 ms).  The right median (across palm) sensory nerve showed no response (Palm), prolonged distal peak latency (5.2 ms), and reduced amplitude (4.1 V).  The left ulnar sensory and the right ulnar sensory nerves showed prolonged distal peak latency (L6.8, R3.8 ms), reduced amplitude (L6.3, R6.5 V), and decreased conduction velocity (Wrist-5th Digit, L21, R37 m/s).  All remaining nerves (as indicated in the following tables) were within normal limits.  Left vs. Right side comparison data for the ulnar motor nerve indicates abnormal L-R amplitude difference (39.1 %).  The ulnar sensory nerve indicates abnormal L-R latency difference (3.0 ms).  All remaining left vs. right side differences were within normal limits.    Needle evaluation of the right abductor pollicis brevis muscle showed increased insertional activity and slightly increased spontaneous activity.  All remaining muscles (as indicated in the following table) showed no evidence of electrical instability.    Impression: The above electrodiagnostic study is ABNORMAL and reveals evidence of: 1.  A moderate to severe right median nerve entrapment at the wrist (carpal tunnel syndrome) affecting sensory and motor components.   2. A moderate left median nerve entrapment at the wrist (carpal tunnel syndrome) affecting sensory and motor components.   3. 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   4.  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   *This test is difficult to interpret due to the concomitant peripheral neuropathy, however, the nerve conductions do drop across the elbow more than showing a distal slowing only which would go along more with entrapment syndromes versus just polyneuropathy.  Recommendations: 1.  Follow-up with referring  physician. 2.  Continue current management of symptoms. 3.  Suggest surgical evaluation. Clinical correlation with her symptoms is paramount   Nerve Conduction Studies Anti Sensory Summary Table   Stim Site NR Peak (ms) Norm Peak (ms) P-T Amp (V) Norm P-T Amp Site1 Site2 Delta-P (ms) Dist (cm) Vel (m/s) Norm Vel (m/s)  Left Median Acr Palm Anti Sensory (2nd Digit)  33.3C  Wrist    *6.1 <3.6 *4.4 >10 Wrist Palm 2.9 0.0    Palm    *3.2 <2.0 4.1         Right Median Acr Palm Anti Sensory (2nd Digit)  33.4C  Wrist    *5.2 <3.6 *4.1 >10 Wrist Palm  0.0    Palm *NR  <2.0          Left Radial Anti Sensory (Base 1st Digit)  33.1C  Wrist    2.3 <3.1 17.7  Wrist Base 1st Digit 2.3 0.0    Right Radial Anti Sensory (Base 1st Digit)  32.7C  Wrist    2.2 <3.1 8.5  Wrist Base 1st Digit 2.2 0.0    Left Ulnar Anti Sensory (5th Digit)  33.4C  Wrist    *6.8 <3.7 *6.3 >15.0 Wrist 5th Digit 6.8 14.0 *21 >38  Right Ulnar Anti Sensory (5th Digit)  33.3C  Wrist    *3.8 <3.7 *6.5 >15.0 Wrist 5th Digit 3.8 14.0 *37 >38   Motor Summary Table   Stim Site NR Onset (ms) Norm Onset (ms) O-P Amp (mV) Norm O-P Amp Site1 Site2 Delta-0 (ms) Dist (cm) Vel (m/s) Norm Vel (m/s)  Left Median  Motor (Abd Poll Brev)  33.2C  Wrist    *6.0 <4.2 7.4 >5 Elbow Wrist 5.6 23.0 *41 >50  Elbow    11.6  6.7         Right Median Motor (Abd Poll Brev)  32.8C  Wrist    *6.3 <4.2 10.0 >5 Elbow Wrist 5.3 23.0 *43 >50  Elbow    11.6  10.1         Left Ulnar Motor (Abd Dig Min)  33C  Wrist    3.3 <4.2 4.2 >3 B Elbow Wrist 4.1 22.0 54 >53  B Elbow    7.4  4.1  A Elbow B Elbow 2.6 10.0 *38 >53  A Elbow    10.0  3.4         Right Ulnar Motor (Abd Dig Min)  32.7C  Wrist    3.1 <4.2 6.9 >3 B Elbow Wrist 4.9 26.0 53 >53  B Elbow    8.0  4.3  A Elbow B Elbow 2.9 10.5 *36 >53  A Elbow    10.9  4.2          EMG   Side Muscle Nerve Root Ins Act Fibs Psw Amp Dur Poly Recrt Int Fraser Din Comment  Right Abd Poll Brev Median C8-T1 *Incr  *1+ *1+ Nml Nml 0 Nml Nml   Right 1stDorInt Ulnar C8-T1 Nml Nml Nml Nml Nml 0 Nml Nml   Left Abd Poll Brev Median C8-T1 Nml Nml Nml Nml Nml 0 Nml Nml   Left 1stDorInt Ulnar C8-T1 Nml Nml Nml Nml Nml 0 Nml Nml     Nerve Conduction Studies Anti Sensory Left/Right Comparison   Stim Site L Lat (ms) R Lat (ms) L-R Lat (ms) L Amp (V) R Amp (V) L-R Amp (%) Site1 Site2 L Vel (m/s) R Vel (m/s) L-R Vel (m/s)  Median Acr Palm Anti Sensory (2nd Digit)  33.3C  Wrist *6.1 *5.2 0.9 *4.4 *4.1 6.8 Wrist Palm     Palm *3.2   4.1         Radial Anti Sensory (Base 1st Digit)  33.1C  Wrist 2.3 2.2 0.1 17.7 8.5 52.0 Wrist Base 1st Digit     Ulnar Anti Sensory (5th Digit)  33.4C  Wrist *6.8 *3.8 *3.0 *6.3 *6.5 3.1 Wrist 5th Digit *21 *37 16   Motor Left/Right Comparison   Stim Site L Lat (ms) R Lat (ms) L-R Lat (ms) L Amp (mV) R Amp (mV) L-R Amp (%) Site1 Site2 L Vel (m/s) R Vel (m/s) L-R Vel (m/s)  Median Motor (Abd Poll Brev)  33.2C  Wrist *6.0 *6.3 0.3 7.4 10.0 26.0 Elbow Wrist *41 *43 2  Elbow 11.6 11.6 0.0 6.7 10.1 33.7       Ulnar Motor (Abd Dig Min)  33C  Wrist 3.3 3.1 0.2 4.2 6.9 *39.1 B Elbow Wrist 54 53 1  B Elbow 7.4 8.0 0.6 4.1 4.3 4.7 A Elbow B Elbow *38 *36 2  A Elbow 10.0 10.9 0.9 3.4 4.2 19.0             Clinical History: No specialty comments available.  She reports that she has never smoked. She has never used smokeless tobacco. No results for input(s): HGBA1C, LABURIC in the last 8760 hours.  Objective:  VS:  HT:    WT:   BMI:     BP:   HR: bpm  TEMP: ( )  RESP:  Physical Exam  Constitutional: She is oriented to person, place,  and time. She appears well-developed and well-nourished.  Eyes: Conjunctivae and EOM are normal. Pupils are equal, round, and reactive to light.  Cardiovascular: Normal rate and intact distal pulses.   Pulmonary/Chest: Effort normal.  Musculoskeletal:  Examination of the hand shows CMC joint arthritis without synovitis. There is no edema no  allodynia. She has fairly good strength with APB and FDI. She has good strength with wrist extension and long finger flexion. It is difficult to get her to exactly say with sensation testing but appears to be intact bilaterally symmetrically. She has an equivocal Tinel's at the wrist on the right versus left. She has no Tinel's over the elbows.  Neurological: She is alert and oriented to person, place, and time. A sensory deficit is present. She exhibits normal muscle tone. Coordination normal.  Skin: Skin is warm and dry. No rash noted. No erythema.  Psychiatric: She has a normal mood and affect. Her behavior is normal.  Nursing note and vitals reviewed.   Ortho Exam Imaging: No results found.  Past Medical/Family/Surgical/Social History: Medications & Allergies reviewed per EMR There are no active problems to display for this patient.  Past Medical History:  Diagnosis Date  . Diabetes mellitus without complication ([place])    Family History  Problem Relation Age of Onset  . Diabetes Mother   . Hypertension Father    History reviewed. No pertinent surgical history. Social History   Occupational History  . Not on file.   Social History Main Topics  . Smoking status: Never Smoker  . Smokeless tobacco: Never Used  . Alcohol use No  . Drug use: No  . Sexual activity: Not on file

## 2016-01-11 ENCOUNTER — Encounter (INDEPENDENT_AMBULATORY_CARE_PROVIDER_SITE_OTHER): Payer: Self-pay | Admitting: Physical Medicine and Rehabilitation

## 2016-01-11 NOTE — Procedures (Signed)
EMG & NCV Findings: Evaluation of the left median motor and the right median motor nerves showed prolonged distal onset latency (L6.0, R6.3 ms) and decreased conduction velocity (Elbow-Wrist, L41, R43 m/s).  The left ulnar motor and the right ulnar motor nerves showed decreased conduction velocity (A Elbow-B Elbow, L38, R36 m/s).  The left median (across palm) sensory nerve showed prolonged distal peak latency (Wrist, 6.1 ms), reduced amplitude (4.4 V), and prolonged distal peak latency (Palm, 3.2 ms).  The right median (across palm) sensory nerve showed no response (Palm), prolonged distal peak latency (5.2 ms), and reduced amplitude (4.1 V).  The left ulnar sensory and the right ulnar sensory nerves showed prolonged distal peak latency (L6.8, R3.8 ms), reduced amplitude (L6.3, R6.5 V), and decreased conduction velocity (Wrist-5th Digit, L21, R37 m/s).  All remaining nerves (as indicated in the following tables) were within normal limits.  Left vs. Right side comparison data for the ulnar motor nerve indicates abnormal L-R amplitude difference (39.1 %).  The ulnar sensory nerve indicates abnormal L-R latency difference (3.0 ms).  All remaining left vs. right side differences were within normal limits.    Needle evaluation of the right abductor pollicis brevis muscle showed increased insertional activity and slightly increased spontaneous activity.  All remaining muscles (as indicated in the following table) showed no evidence of electrical instability.    Impression: The above electrodiagnostic study is ABNORMAL and reveals evidence of: 1.  A moderate to severe right median nerve entrapment at the wrist (carpal tunnel syndrome) affecting sensory and motor components.   2. A moderate left median nerve entrapment at the wrist (carpal tunnel syndrome) affecting sensory and motor components.   3. 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   4.  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   *This test is difficult to interpret due to the concomitant peripheral neuropathy, however, the nerve conductions do drop across the elbow more than showing a distal slowing only which would go along more with entrapment syndromes versus just polyneuropathy.  Recommendations: 1.  Follow-up with referring physician. 2.  Continue current management of symptoms. 3.  Suggest surgical evaluation. Clinical correlation with her symptoms is paramount   Nerve Conduction Studies Anti Sensory Summary Table   Stim Site NR Peak (ms) Norm Peak (ms) P-T Amp (V) Norm P-T Amp Site1 Site2 Delta-P (ms) Dist (cm) Vel (m/s) Norm Vel (m/s)  Left Median Acr Palm Anti Sensory (2nd Digit)  33.3C  Wrist    *6.1 <3.6 *4.4 >10 Wrist Palm 2.9 0.0    Palm    *3.2 <2.0 4.1         Right Median Acr Palm Anti Sensory (2nd Digit)  33.4C  Wrist    *5.2 <3.6 *4.1 >10 Wrist Palm  0.0    Palm *NR  <2.0          Left Radial Anti Sensory (Base 1st Digit)  33.1C  Wrist    2.3 <3.1 17.7  Wrist Base 1st Digit 2.3 0.0    Right Radial Anti Sensory (Base 1st Digit)  32.7C  Wrist    2.2 <3.1 8.5  Wrist Base 1st Digit 2.2 0.0    Left Ulnar Anti Sensory (5th Digit)  33.4C  Wrist    *6.8 <3.7 *6.3 >15.0 Wrist 5th Digit 6.8 14.0 *21 >38  Right Ulnar Anti Sensory (5th Digit)  33.3C  Wrist    *3.8 <3.7 *  6.5 >15.0 Wrist 5th Digit 3.8 14.0 *37 >38   Motor Summary Table   Stim Site NR Onset (ms) Norm Onset (ms) O-P Amp (mV) Norm O-P Amp Site1 Site2 Delta-0 (ms) Dist (cm) Vel (m/s) Norm Vel (m/s)  Left Median Motor (Abd Poll Brev)  33.2C  Wrist    *6.0 <4.2 7.4 >5 Elbow Wrist 5.6 23.0 *41 >50  Elbow    11.6  6.7         Right Median Motor (Abd Poll Brev)  32.8C  Wrist    *6.3 <4.2 10.0 >5 Elbow Wrist 5.3 23.0 *43 >50  Elbow    11.6  10.1         Left Ulnar Motor (Abd Dig Min)  33C  Wrist    3.3 <4.2 4.2 >3 B  Elbow Wrist 4.1 22.0 54 >53  B Elbow    7.4  4.1  A Elbow B Elbow 2.6 10.0 *38 >53  A Elbow    10.0  3.4         Right Ulnar Motor (Abd Dig Min)  32.7C  Wrist    3.1 <4.2 6.9 >3 B Elbow Wrist 4.9 26.0 53 >53  B Elbow    8.0  4.3  A Elbow B Elbow 2.9 10.5 *36 >53  A Elbow    10.9  4.2          EMG   Side Muscle Nerve Root Ins Act Fibs Psw Amp Dur Poly Recrt Int Fraser Din Comment  Right Abd Poll Brev Median C8-T1 *Incr *1+ *1+ Nml Nml 0 Nml Nml   Right 1stDorInt Ulnar C8-T1 Nml Nml Nml Nml Nml 0 Nml Nml   Left Abd Poll Brev Median C8-T1 Nml Nml Nml Nml Nml 0 Nml Nml   Left 1stDorInt Ulnar C8-T1 Nml Nml Nml Nml Nml 0 Nml Nml     Nerve Conduction Studies Anti Sensory Left/Right Comparison   Stim Site L Lat (ms) R Lat (ms) L-R Lat (ms) L Amp (V) R Amp (V) L-R Amp (%) Site1 Site2 L Vel (m/s) R Vel (m/s) L-R Vel (m/s)  Median Acr Palm Anti Sensory (2nd Digit)  33.3C  Wrist *6.1 *5.2 0.9 *4.4 *4.1 6.8 Wrist Palm     Palm *3.2   4.1         Radial Anti Sensory (Base 1st Digit)  33.1C  Wrist 2.3 2.2 0.1 17.7 8.5 52.0 Wrist Base 1st Digit     Ulnar Anti Sensory (5th Digit)  33.4C  Wrist *6.8 *3.8 *3.0 *6.3 *6.5 3.1 Wrist 5th Digit *21 *37 16   Motor Left/Right Comparison   Stim Site L Lat (ms) R Lat (ms) L-R Lat (ms) L Amp (mV) R Amp (mV) L-R Amp (%) Site1 Site2 L Vel (m/s) R Vel (m/s) L-R Vel (m/s)  Median Motor (Abd Poll Brev)  33.2C  Wrist *6.0 *6.3 0.3 7.4 10.0 26.0 Elbow Wrist *41 *43 2  Elbow 11.6 11.6 0.0 6.7 10.1 33.7       Ulnar Motor (Abd Dig Min)  33C  Wrist 3.3 3.1 0.2 4.2 6.9 *39.1 B Elbow Wrist 54 53 1  B Elbow 7.4 8.0 0.6 4.1 4.3 4.7 A Elbow B Elbow *38 *36 2  A Elbow 10.0 10.9 0.9 3.4 4.2 19.0

## 2016-01-18 ENCOUNTER — Encounter (INDEPENDENT_AMBULATORY_CARE_PROVIDER_SITE_OTHER): Payer: Self-pay | Admitting: Orthopaedic Surgery

## 2016-01-18 ENCOUNTER — Ambulatory Visit (INDEPENDENT_AMBULATORY_CARE_PROVIDER_SITE_OTHER): Payer: Medicare Other | Admitting: Orthopaedic Surgery

## 2016-01-18 VITALS — BP 133/53 | HR 83 | Resp 14 | Ht 66.0 in | Wt 195.0 lb

## 2016-01-18 DIAGNOSIS — G5601 Carpal tunnel syndrome, right upper limb: Secondary | ICD-10-CM | POA: Diagnosis not present

## 2016-01-18 DIAGNOSIS — G5602 Carpal tunnel syndrome, left upper limb: Secondary | ICD-10-CM

## 2016-01-18 NOTE — Progress Notes (Signed)
   Office Visit Note   Patient: Debra [name]           Date of Birth: 04-22-1930           MRN: [id number] Visit Date: 01/18/2016              Requested by: Asencion Noble, MD [address] [place], [place] [address] PCP: Asencion Noble, MD   Assessment & Plan: Visit Diagnoses: bilateral carpal tunnel syndrome and possible compression ulna nerves at elbow bilaterally Plan: Bilateral volar wrist splints and follow-up when necessary. Long discussion with family regarding the diagnosis. Debra [name] is significantly better and wishes to continue with nonoperative treatment.  Follow-Up Instructions: No Follow-up on file.   Orders:  No orders of the defined types were placed in this encounter.  No orders of the defined types were placed in this encounter.     Procedures: No procedures performed   Clinical Data: No additional findings.   Subjective: No chief complaint on file.   Pt states her hands are doing a little better. Been wearing brace at night on Right hand, makes it feel better  Having considerably less pain and more motion of his hand wearing the volar wrist splint on the right  Review of Systems   Objective: Vital Signs: There were no vitals taken for this visit.  Physical Exam  Ortho Exam good grip and release both hands. No swelling of fingers. No related joint complaints of either hand. Minimal Tinel's over both median nerves at the wrists. No pain with compression or tapping of ulnar nerves at both elbows. Neurovascular  exam is intact  Specialty Comments:  No specialty comments available.  Imaging: No results found.   PMFS History: There are no active problems to display for this patient.  Past Medical History:  Diagnosis Date  . Diabetes mellitus without complication ([place])     Family History  Problem Relation Age of Onset  . Diabetes Mother   . Hypertension Father     No past surgical history on file. Social History   Occupational  History  . Not on file.   Social History Main Topics  . Smoking status: Never Smoker  . Smokeless tobacco: Never Used  . Alcohol use No  . Drug use: No  . Sexual activity: Not on file

## 2016-02-24 DIAGNOSIS — E1142 Type 2 diabetes mellitus with diabetic polyneuropathy: Secondary | ICD-10-CM | POA: Diagnosis not present

## 2016-02-24 DIAGNOSIS — B351 Tinea unguium: Secondary | ICD-10-CM | POA: Diagnosis not present

## 2016-03-06 DIAGNOSIS — H401131 Primary open-angle glaucoma, bilateral, mild stage: Secondary | ICD-10-CM | POA: Diagnosis not present

## 2016-03-21 DIAGNOSIS — E119 Type 2 diabetes mellitus without complications: Secondary | ICD-10-CM | POA: Diagnosis not present

## 2016-03-21 DIAGNOSIS — N183 Chronic kidney disease, stage 3 (moderate): Secondary | ICD-10-CM | POA: Diagnosis not present

## 2016-03-21 DIAGNOSIS — I10 Essential (primary) hypertension: Secondary | ICD-10-CM | POA: Diagnosis not present

## 2016-03-21 DIAGNOSIS — Z79899 Other long term (current) drug therapy: Secondary | ICD-10-CM | POA: Diagnosis not present

## 2016-03-21 DIAGNOSIS — E785 Hyperlipidemia, unspecified: Secondary | ICD-10-CM | POA: Diagnosis not present

## 2016-03-28 DIAGNOSIS — N183 Chronic kidney disease, stage 3 (moderate): Secondary | ICD-10-CM | POA: Diagnosis not present

## 2016-03-28 DIAGNOSIS — E1122 Type 2 diabetes mellitus with diabetic chronic kidney disease: Secondary | ICD-10-CM | POA: Diagnosis not present

## 2016-03-28 DIAGNOSIS — M1000 Idiopathic gout, unspecified site: Secondary | ICD-10-CM | POA: Diagnosis not present

## 2016-05-08 DIAGNOSIS — B351 Tinea unguium: Secondary | ICD-10-CM | POA: Diagnosis not present

## 2016-05-08 DIAGNOSIS — E1142 Type 2 diabetes mellitus with diabetic polyneuropathy: Secondary | ICD-10-CM | POA: Diagnosis not present

## 2016-06-05 DIAGNOSIS — H401131 Primary open-angle glaucoma, bilateral, mild stage: Secondary | ICD-10-CM | POA: Diagnosis not present

## 2016-06-26 DIAGNOSIS — N183 Chronic kidney disease, stage 3 (moderate): Secondary | ICD-10-CM | POA: Diagnosis not present

## 2016-06-26 DIAGNOSIS — I10 Essential (primary) hypertension: Secondary | ICD-10-CM | POA: Diagnosis not present

## 2016-06-26 DIAGNOSIS — E785 Hyperlipidemia, unspecified: Secondary | ICD-10-CM | POA: Diagnosis not present

## 2016-06-26 DIAGNOSIS — E1129 Type 2 diabetes mellitus with other diabetic kidney complication: Secondary | ICD-10-CM | POA: Diagnosis not present

## 2016-06-26 DIAGNOSIS — M1000 Idiopathic gout, unspecified site: Secondary | ICD-10-CM | POA: Diagnosis not present

## 2016-07-04 DIAGNOSIS — E785 Hyperlipidemia, unspecified: Secondary | ICD-10-CM | POA: Diagnosis not present

## 2016-07-04 DIAGNOSIS — I10 Essential (primary) hypertension: Secondary | ICD-10-CM | POA: Diagnosis not present

## 2016-07-04 DIAGNOSIS — N183 Chronic kidney disease, stage 3 (moderate): Secondary | ICD-10-CM | POA: Diagnosis not present

## 2016-07-04 DIAGNOSIS — E1122 Type 2 diabetes mellitus with diabetic chronic kidney disease: Secondary | ICD-10-CM | POA: Diagnosis not present

## 2016-07-24 DIAGNOSIS — E1142 Type 2 diabetes mellitus with diabetic polyneuropathy: Secondary | ICD-10-CM | POA: Diagnosis not present

## 2016-07-24 DIAGNOSIS — B351 Tinea unguium: Secondary | ICD-10-CM | POA: Diagnosis not present

## 2016-09-04 DIAGNOSIS — H401131 Primary open-angle glaucoma, bilateral, mild stage: Secondary | ICD-10-CM | POA: Diagnosis not present

## 2016-09-04 DIAGNOSIS — E119 Type 2 diabetes mellitus without complications: Secondary | ICD-10-CM | POA: Diagnosis not present

## 2016-09-04 DIAGNOSIS — Z961 Presence of intraocular lens: Secondary | ICD-10-CM | POA: Diagnosis not present

## 2016-10-04 DIAGNOSIS — E785 Hyperlipidemia, unspecified: Secondary | ICD-10-CM | POA: Diagnosis not present

## 2016-10-04 DIAGNOSIS — Z79899 Other long term (current) drug therapy: Secondary | ICD-10-CM | POA: Diagnosis not present

## 2016-10-04 DIAGNOSIS — I10 Essential (primary) hypertension: Secondary | ICD-10-CM | POA: Diagnosis not present

## 2016-10-09 DIAGNOSIS — E1142 Type 2 diabetes mellitus with diabetic polyneuropathy: Secondary | ICD-10-CM | POA: Diagnosis not present

## 2016-10-09 DIAGNOSIS — B351 Tinea unguium: Secondary | ICD-10-CM | POA: Diagnosis not present

## 2016-10-09 DIAGNOSIS — L851 Acquired keratosis [keratoderma] palmaris et plantaris: Secondary | ICD-10-CM | POA: Diagnosis not present

## 2016-10-11 DIAGNOSIS — N183 Chronic kidney disease, stage 3 (moderate): Secondary | ICD-10-CM | POA: Diagnosis not present

## 2016-10-11 DIAGNOSIS — I10 Essential (primary) hypertension: Secondary | ICD-10-CM | POA: Diagnosis not present

## 2016-10-11 DIAGNOSIS — E1122 Type 2 diabetes mellitus with diabetic chronic kidney disease: Secondary | ICD-10-CM | POA: Diagnosis not present

## 2016-12-11 DIAGNOSIS — H401131 Primary open-angle glaucoma, bilateral, mild stage: Secondary | ICD-10-CM | POA: Diagnosis not present

## 2016-12-18 DIAGNOSIS — E1142 Type 2 diabetes mellitus with diabetic polyneuropathy: Secondary | ICD-10-CM | POA: Diagnosis not present

## 2016-12-18 DIAGNOSIS — L851 Acquired keratosis [keratoderma] palmaris et plantaris: Secondary | ICD-10-CM | POA: Diagnosis not present

## 2016-12-18 DIAGNOSIS — B351 Tinea unguium: Secondary | ICD-10-CM | POA: Diagnosis not present

## 2017-01-18 DIAGNOSIS — E1129 Type 2 diabetes mellitus with other diabetic kidney complication: Secondary | ICD-10-CM | POA: Diagnosis not present

## 2017-01-18 DIAGNOSIS — I10 Essential (primary) hypertension: Secondary | ICD-10-CM | POA: Diagnosis not present

## 2017-01-18 DIAGNOSIS — N183 Chronic kidney disease, stage 3 (moderate): Secondary | ICD-10-CM | POA: Diagnosis not present

## 2017-01-25 DIAGNOSIS — N183 Chronic kidney disease, stage 3 (moderate): Secondary | ICD-10-CM | POA: Diagnosis not present

## 2017-01-25 DIAGNOSIS — I10 Essential (primary) hypertension: Secondary | ICD-10-CM | POA: Diagnosis not present

## 2017-01-25 DIAGNOSIS — E1122 Type 2 diabetes mellitus with diabetic chronic kidney disease: Secondary | ICD-10-CM | POA: Diagnosis not present

## 2017-01-28 ENCOUNTER — Emergency Department (HOSPITAL_COMMUNITY): Payer: Medicare Other

## 2017-01-28 ENCOUNTER — Other Ambulatory Visit: Payer: Self-pay

## 2017-01-28 ENCOUNTER — Encounter (HOSPITAL_COMMUNITY): Payer: Self-pay | Admitting: Emergency Medicine

## 2017-01-28 ENCOUNTER — Emergency Department (HOSPITAL_COMMUNITY)
Admission: EM | Admit: 2017-01-28 | Discharge: 2017-01-28 | Disposition: A | Discharge status: Home / Self Care | Payer: Medicare Other | Attending: Emergency Medicine | Admitting: Emergency Medicine

## 2017-01-28 DIAGNOSIS — Z79899 Other long term (current) drug therapy: Secondary | ICD-10-CM | POA: Diagnosis not present

## 2017-01-28 DIAGNOSIS — Z7982 Long term (current) use of aspirin: Secondary | ICD-10-CM | POA: Insufficient documentation

## 2017-01-28 DIAGNOSIS — Z7984 Long term (current) use of oral hypoglycemic drugs: Secondary | ICD-10-CM | POA: Diagnosis not present

## 2017-01-28 DIAGNOSIS — J705 Respiratory conditions due to smoke inhalation: Secondary | ICD-10-CM | POA: Insufficient documentation

## 2017-01-28 DIAGNOSIS — T59811A Toxic effect of smoke, accidental (unintentional), initial encounter: Secondary | ICD-10-CM

## 2017-01-28 DIAGNOSIS — I10 Essential (primary) hypertension: Secondary | ICD-10-CM | POA: Diagnosis not present

## 2017-01-28 DIAGNOSIS — E119 Type 2 diabetes mellitus without complications: Secondary | ICD-10-CM | POA: Diagnosis not present

## 2017-01-28 DIAGNOSIS — R05 Cough: Secondary | ICD-10-CM | POA: Diagnosis not present

## 2017-01-28 HISTORY — DX: Pure hypercholesterolemia, unspecified: E78.00

## 2017-01-28 HISTORY — DX: Essential (primary) hypertension: I10

## 2017-01-28 IMAGING — DX DG CHEST 2V
2 series · 2 of 2 positions shown · non-contrast
Comparison: [DATE] radiograph

CLINICAL DATA: Cough

EXAM:
CHEST  2 VIEW

[chest pa]
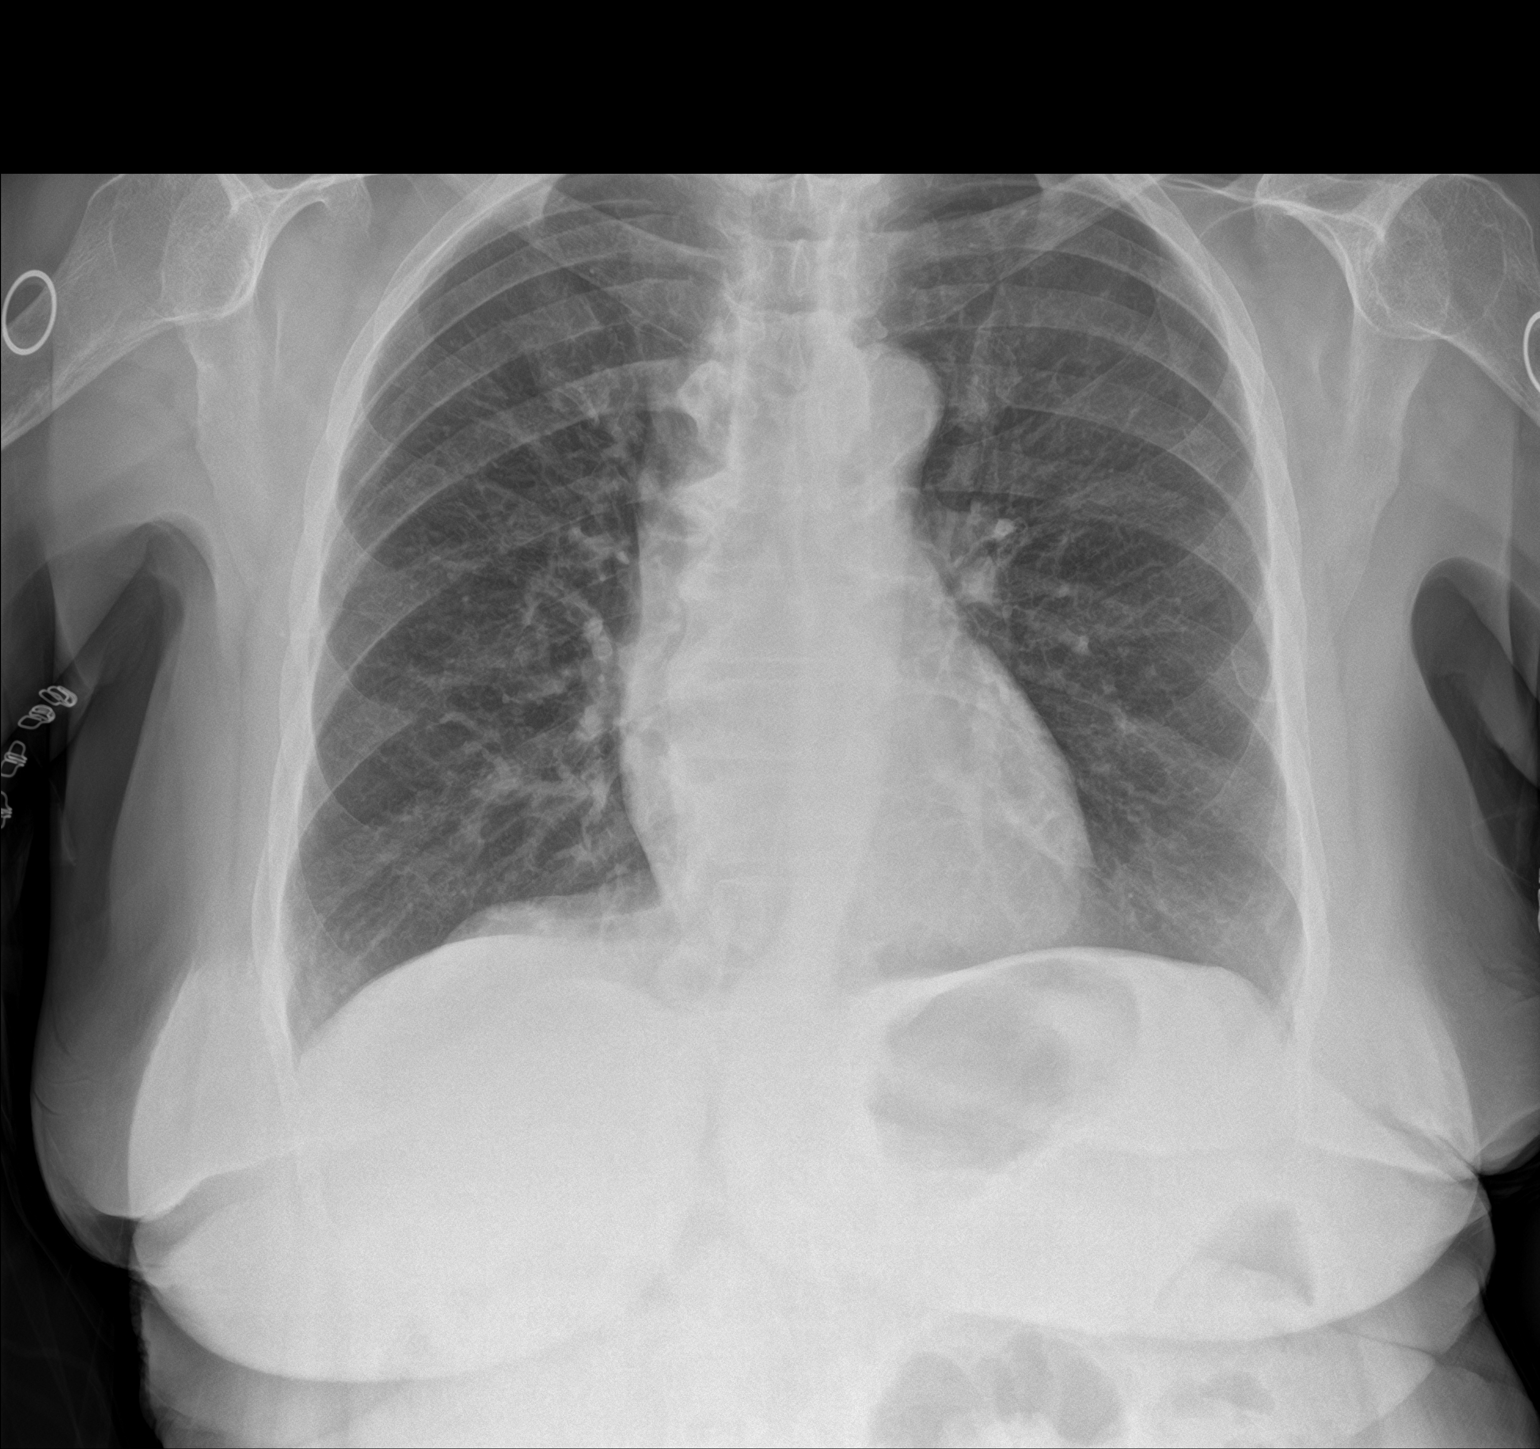

[chest lat]
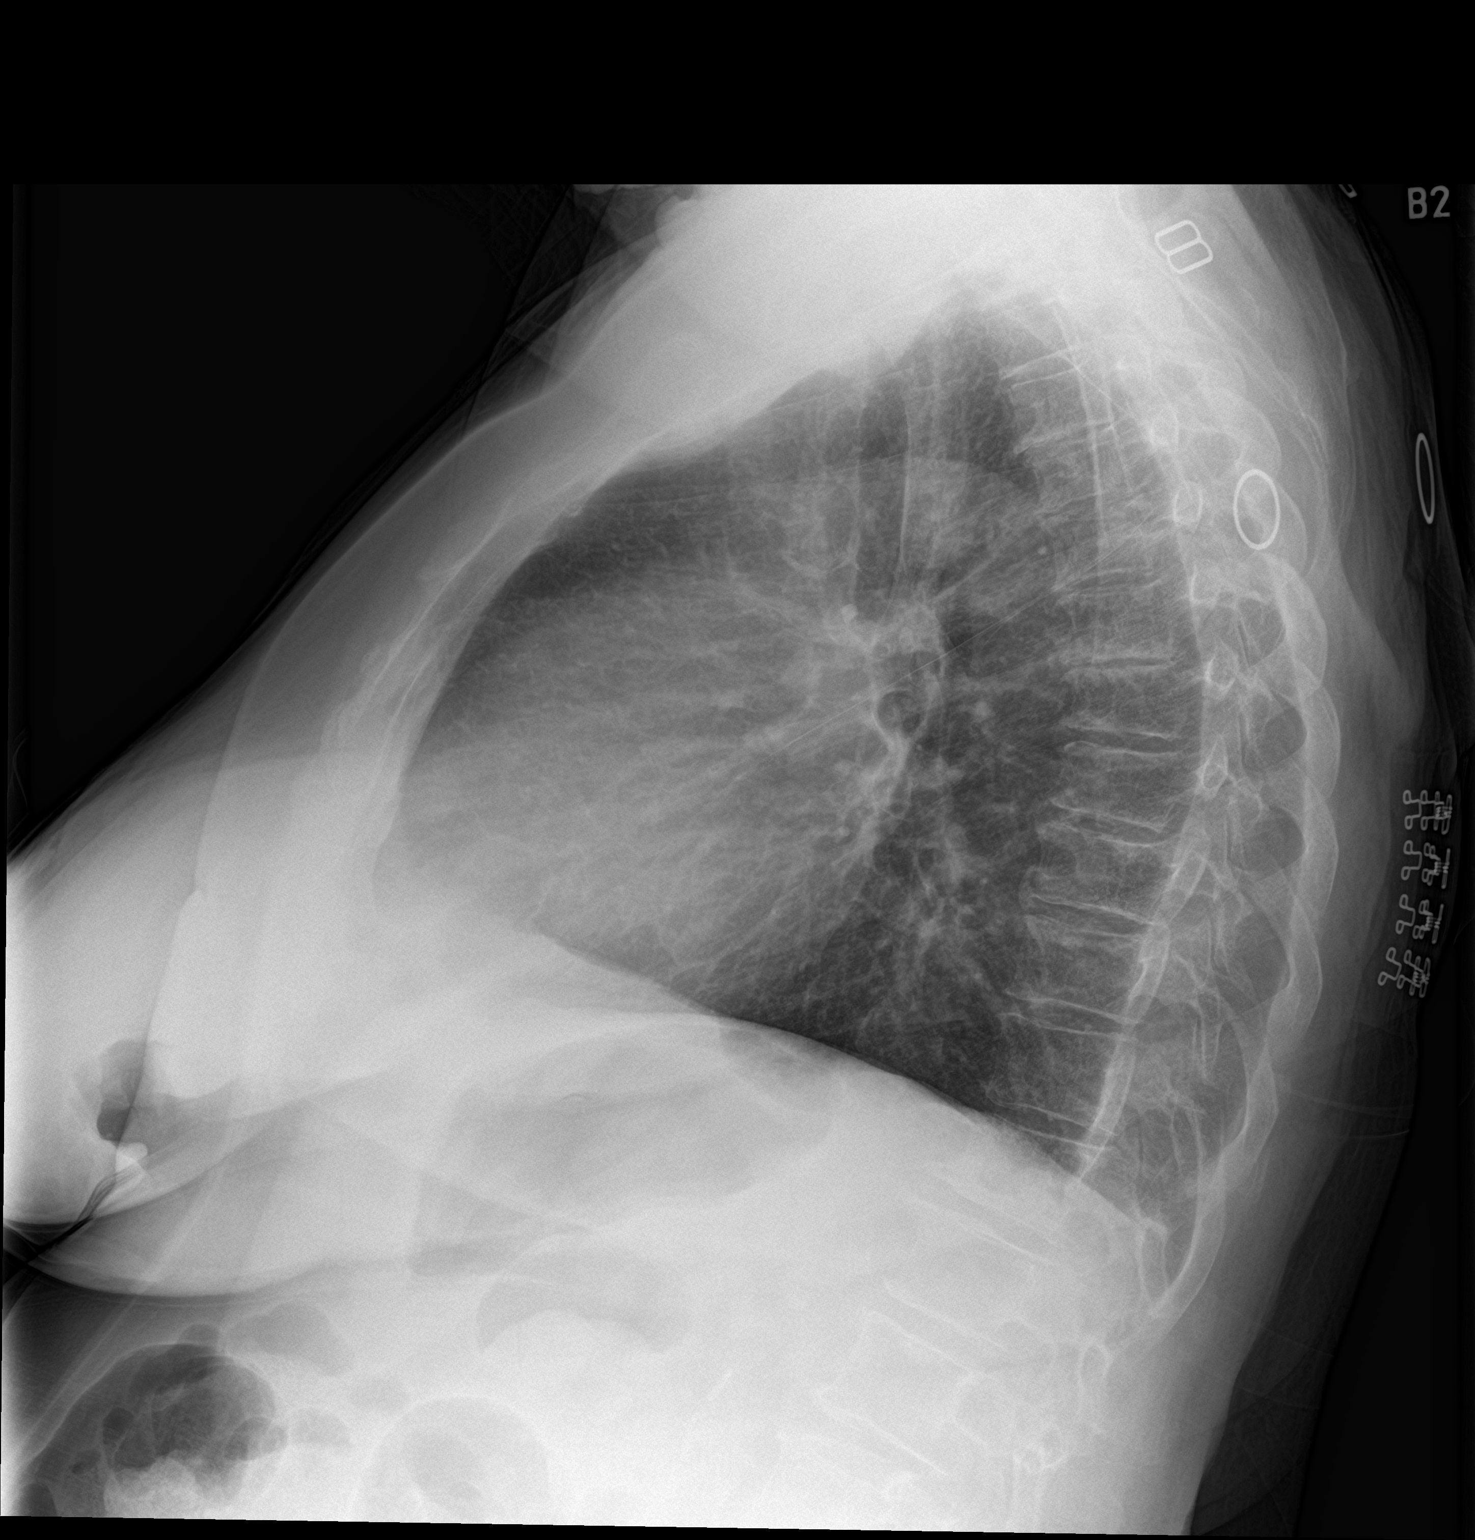

[2 of 2 positions shown; findings below may reference images not displayed]

FINDINGS: Mild bronchitic changes. No consolidation or effusion. Normal heart
size. Aortic atherosclerosis. No pneumothorax. Mild degenerative
changes of the spine.
IMPRESSION: No active cardiopulmonary disease.  Mild bronchitic changes.
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
## 2017-01-29 ENCOUNTER — Other Ambulatory Visit: Payer: Self-pay

## 2017-02-26 DIAGNOSIS — L851 Acquired keratosis [keratoderma] palmaris et plantaris: Secondary | ICD-10-CM | POA: Diagnosis not present

## 2017-02-26 DIAGNOSIS — E1142 Type 2 diabetes mellitus with diabetic polyneuropathy: Secondary | ICD-10-CM | POA: Diagnosis not present

## 2017-02-26 DIAGNOSIS — B351 Tinea unguium: Secondary | ICD-10-CM | POA: Diagnosis not present

## 2017-03-19 DIAGNOSIS — H401131 Primary open-angle glaucoma, bilateral, mild stage: Secondary | ICD-10-CM | POA: Diagnosis not present

## 2017-05-07 DIAGNOSIS — E1142 Type 2 diabetes mellitus with diabetic polyneuropathy: Secondary | ICD-10-CM | POA: Diagnosis not present

## 2017-05-07 DIAGNOSIS — B351 Tinea unguium: Secondary | ICD-10-CM | POA: Diagnosis not present

## 2017-05-07 DIAGNOSIS — L851 Acquired keratosis [keratoderma] palmaris et plantaris: Secondary | ICD-10-CM | POA: Diagnosis not present

## 2017-05-15 DIAGNOSIS — I700 Atherosclerosis of aorta: Secondary | ICD-10-CM | POA: Diagnosis not present

## 2017-05-15 DIAGNOSIS — N183 Chronic kidney disease, stage 3 (moderate): Secondary | ICD-10-CM | POA: Diagnosis not present

## 2017-05-15 DIAGNOSIS — I10 Essential (primary) hypertension: Secondary | ICD-10-CM | POA: Diagnosis not present

## 2017-05-15 DIAGNOSIS — E1122 Type 2 diabetes mellitus with diabetic chronic kidney disease: Secondary | ICD-10-CM | POA: Diagnosis not present

## 2017-07-16 DIAGNOSIS — E1142 Type 2 diabetes mellitus with diabetic polyneuropathy: Secondary | ICD-10-CM | POA: Diagnosis not present

## 2017-07-16 DIAGNOSIS — L851 Acquired keratosis [keratoderma] palmaris et plantaris: Secondary | ICD-10-CM | POA: Diagnosis not present

## 2017-07-16 DIAGNOSIS — B351 Tinea unguium: Secondary | ICD-10-CM | POA: Diagnosis not present

## 2017-08-08 DIAGNOSIS — E1129 Type 2 diabetes mellitus with other diabetic kidney complication: Secondary | ICD-10-CM | POA: Diagnosis not present

## 2017-08-08 DIAGNOSIS — N183 Chronic kidney disease, stage 3 (moderate): Secondary | ICD-10-CM | POA: Diagnosis not present

## 2017-08-08 DIAGNOSIS — E785 Hyperlipidemia, unspecified: Secondary | ICD-10-CM | POA: Diagnosis not present

## 2017-08-08 DIAGNOSIS — Z79899 Other long term (current) drug therapy: Secondary | ICD-10-CM | POA: Diagnosis not present

## 2017-08-08 DIAGNOSIS — I10 Essential (primary) hypertension: Secondary | ICD-10-CM | POA: Diagnosis not present

## 2017-08-15 DIAGNOSIS — I10 Essential (primary) hypertension: Secondary | ICD-10-CM | POA: Diagnosis not present

## 2017-08-15 DIAGNOSIS — E1122 Type 2 diabetes mellitus with diabetic chronic kidney disease: Secondary | ICD-10-CM | POA: Diagnosis not present

## 2017-08-15 DIAGNOSIS — I700 Atherosclerosis of aorta: Secondary | ICD-10-CM | POA: Diagnosis not present

## 2017-08-15 DIAGNOSIS — N183 Chronic kidney disease, stage 3 (moderate): Secondary | ICD-10-CM | POA: Diagnosis not present

## 2017-10-08 DIAGNOSIS — L851 Acquired keratosis [keratoderma] palmaris et plantaris: Secondary | ICD-10-CM | POA: Diagnosis not present

## 2017-10-08 DIAGNOSIS — B351 Tinea unguium: Secondary | ICD-10-CM | POA: Diagnosis not present

## 2017-10-08 DIAGNOSIS — E1142 Type 2 diabetes mellitus with diabetic polyneuropathy: Secondary | ICD-10-CM | POA: Diagnosis not present

## 2017-11-11 DIAGNOSIS — H401131 Primary open-angle glaucoma, bilateral, mild stage: Secondary | ICD-10-CM | POA: Diagnosis not present

## 2017-11-11 DIAGNOSIS — Z961 Presence of intraocular lens: Secondary | ICD-10-CM | POA: Diagnosis not present

## 2017-11-11 DIAGNOSIS — Z7984 Long term (current) use of oral hypoglycemic drugs: Secondary | ICD-10-CM | POA: Diagnosis not present

## 2017-11-11 DIAGNOSIS — E119 Type 2 diabetes mellitus without complications: Secondary | ICD-10-CM | POA: Diagnosis not present

## 2017-11-20 DIAGNOSIS — N183 Chronic kidney disease, stage 3 (moderate): Secondary | ICD-10-CM | POA: Diagnosis not present

## 2017-11-20 DIAGNOSIS — E1129 Type 2 diabetes mellitus with other diabetic kidney complication: Secondary | ICD-10-CM | POA: Diagnosis not present

## 2017-11-27 DIAGNOSIS — N183 Chronic kidney disease, stage 3 (moderate): Secondary | ICD-10-CM | POA: Diagnosis not present

## 2017-11-27 DIAGNOSIS — I10 Essential (primary) hypertension: Secondary | ICD-10-CM | POA: Diagnosis not present

## 2017-11-27 DIAGNOSIS — E1122 Type 2 diabetes mellitus with diabetic chronic kidney disease: Secondary | ICD-10-CM | POA: Diagnosis not present

## 2017-12-17 DIAGNOSIS — E1142 Type 2 diabetes mellitus with diabetic polyneuropathy: Secondary | ICD-10-CM | POA: Diagnosis not present

## 2017-12-17 DIAGNOSIS — L851 Acquired keratosis [keratoderma] palmaris et plantaris: Secondary | ICD-10-CM | POA: Diagnosis not present

## 2017-12-17 DIAGNOSIS — B351 Tinea unguium: Secondary | ICD-10-CM | POA: Diagnosis not present

## 2018-02-20 DIAGNOSIS — Z79899 Other long term (current) drug therapy: Secondary | ICD-10-CM | POA: Diagnosis not present

## 2018-02-20 DIAGNOSIS — E1129 Type 2 diabetes mellitus with other diabetic kidney complication: Secondary | ICD-10-CM | POA: Diagnosis not present

## 2018-02-20 DIAGNOSIS — N183 Chronic kidney disease, stage 3 (moderate): Secondary | ICD-10-CM | POA: Diagnosis not present

## 2018-02-27 DIAGNOSIS — E1122 Type 2 diabetes mellitus with diabetic chronic kidney disease: Secondary | ICD-10-CM | POA: Diagnosis not present

## 2018-02-27 DIAGNOSIS — N183 Chronic kidney disease, stage 3 (moderate): Secondary | ICD-10-CM | POA: Diagnosis not present

## 2018-02-27 DIAGNOSIS — I10 Essential (primary) hypertension: Secondary | ICD-10-CM | POA: Diagnosis not present

## 2018-06-04 DIAGNOSIS — I10 Essential (primary) hypertension: Secondary | ICD-10-CM | POA: Diagnosis not present

## 2018-06-04 DIAGNOSIS — E1129 Type 2 diabetes mellitus with other diabetic kidney complication: Secondary | ICD-10-CM | POA: Diagnosis not present

## 2018-07-08 DIAGNOSIS — H401131 Primary open-angle glaucoma, bilateral, mild stage: Secondary | ICD-10-CM | POA: Diagnosis not present

## 2018-07-28 DIAGNOSIS — E1140 Type 2 diabetes mellitus with diabetic neuropathy, unspecified: Secondary | ICD-10-CM | POA: Diagnosis not present

## 2018-07-28 DIAGNOSIS — M79672 Pain in left foot: Secondary | ICD-10-CM | POA: Diagnosis not present

## 2018-07-28 DIAGNOSIS — M79671 Pain in right foot: Secondary | ICD-10-CM | POA: Diagnosis not present

## 2018-07-28 DIAGNOSIS — L110 Acquired keratosis follicularis: Secondary | ICD-10-CM | POA: Diagnosis not present

## 2018-07-28 DIAGNOSIS — I739 Peripheral vascular disease, unspecified: Secondary | ICD-10-CM | POA: Diagnosis not present

## 2018-08-26 DIAGNOSIS — E1129 Type 2 diabetes mellitus with other diabetic kidney complication: Secondary | ICD-10-CM | POA: Diagnosis not present

## 2018-08-26 DIAGNOSIS — I10 Essential (primary) hypertension: Secondary | ICD-10-CM | POA: Diagnosis not present

## 2018-08-26 DIAGNOSIS — M109 Gout, unspecified: Secondary | ICD-10-CM | POA: Diagnosis not present

## 2018-08-26 DIAGNOSIS — E785 Hyperlipidemia, unspecified: Secondary | ICD-10-CM | POA: Diagnosis not present

## 2018-08-26 DIAGNOSIS — Z79899 Other long term (current) drug therapy: Secondary | ICD-10-CM | POA: Diagnosis not present

## 2018-09-17 DIAGNOSIS — M1000 Idiopathic gout, unspecified site: Secondary | ICD-10-CM | POA: Diagnosis not present

## 2018-09-17 DIAGNOSIS — N183 Chronic kidney disease, stage 3 (moderate): Secondary | ICD-10-CM | POA: Diagnosis not present

## 2018-09-17 DIAGNOSIS — E1122 Type 2 diabetes mellitus with diabetic chronic kidney disease: Secondary | ICD-10-CM | POA: Diagnosis not present

## 2018-09-17 DIAGNOSIS — E785 Hyperlipidemia, unspecified: Secondary | ICD-10-CM | POA: Diagnosis not present

## 2018-10-20 DIAGNOSIS — M79671 Pain in right foot: Secondary | ICD-10-CM | POA: Diagnosis not present

## 2018-10-20 DIAGNOSIS — L110 Acquired keratosis follicularis: Secondary | ICD-10-CM | POA: Diagnosis not present

## 2018-10-20 DIAGNOSIS — E1140 Type 2 diabetes mellitus with diabetic neuropathy, unspecified: Secondary | ICD-10-CM | POA: Diagnosis not present

## 2018-10-20 DIAGNOSIS — I739 Peripheral vascular disease, unspecified: Secondary | ICD-10-CM | POA: Diagnosis not present

## 2018-10-20 DIAGNOSIS — M79672 Pain in left foot: Secondary | ICD-10-CM | POA: Diagnosis not present

## 2019-01-05 DIAGNOSIS — E1129 Type 2 diabetes mellitus with other diabetic kidney complication: Secondary | ICD-10-CM | POA: Diagnosis not present

## 2019-01-05 DIAGNOSIS — M109 Gout, unspecified: Secondary | ICD-10-CM | POA: Diagnosis not present

## 2019-01-12 DIAGNOSIS — Z719 Counseling, unspecified: Secondary | ICD-10-CM | POA: Diagnosis not present

## 2019-01-26 DIAGNOSIS — M79672 Pain in left foot: Secondary | ICD-10-CM | POA: Diagnosis not present

## 2019-01-26 DIAGNOSIS — E1140 Type 2 diabetes mellitus with diabetic neuropathy, unspecified: Secondary | ICD-10-CM | POA: Diagnosis not present

## 2019-01-26 DIAGNOSIS — L110 Acquired keratosis follicularis: Secondary | ICD-10-CM | POA: Diagnosis not present

## 2019-01-26 DIAGNOSIS — M79671 Pain in right foot: Secondary | ICD-10-CM | POA: Diagnosis not present

## 2019-01-26 DIAGNOSIS — I739 Peripheral vascular disease, unspecified: Secondary | ICD-10-CM | POA: Diagnosis not present

## 2019-02-02 DIAGNOSIS — M25579 Pain in unspecified ankle and joints of unspecified foot: Secondary | ICD-10-CM | POA: Diagnosis not present

## 2019-02-02 DIAGNOSIS — M79671 Pain in right foot: Secondary | ICD-10-CM | POA: Diagnosis not present

## 2019-02-02 DIAGNOSIS — M79672 Pain in left foot: Secondary | ICD-10-CM | POA: Diagnosis not present

## 2019-02-13 ENCOUNTER — Other Ambulatory Visit: Payer: Self-pay | Admitting: Internal Medicine

## 2019-02-13 DIAGNOSIS — E1129 Type 2 diabetes mellitus with other diabetic kidney complication: Secondary | ICD-10-CM | POA: Diagnosis not present

## 2019-02-13 DIAGNOSIS — R410 Disorientation, unspecified: Secondary | ICD-10-CM

## 2019-02-13 DIAGNOSIS — I10 Essential (primary) hypertension: Secondary | ICD-10-CM | POA: Diagnosis not present

## 2019-02-13 DIAGNOSIS — R413 Other amnesia: Secondary | ICD-10-CM

## 2019-02-18 ENCOUNTER — Encounter: Payer: Self-pay | Admitting: Gastroenterology

## 2019-02-18 DIAGNOSIS — I10 Essential (primary) hypertension: Secondary | ICD-10-CM | POA: Diagnosis not present

## 2019-02-18 DIAGNOSIS — E1129 Type 2 diabetes mellitus with other diabetic kidney complication: Secondary | ICD-10-CM | POA: Diagnosis not present

## 2019-02-18 DIAGNOSIS — R413 Other amnesia: Secondary | ICD-10-CM | POA: Diagnosis not present

## 2019-02-18 DIAGNOSIS — Z79899 Other long term (current) drug therapy: Secondary | ICD-10-CM | POA: Diagnosis not present

## 2019-02-23 DIAGNOSIS — M79672 Pain in left foot: Secondary | ICD-10-CM | POA: Diagnosis not present

## 2019-02-23 DIAGNOSIS — M79671 Pain in right foot: Secondary | ICD-10-CM | POA: Diagnosis not present

## 2019-02-23 DIAGNOSIS — M25579 Pain in unspecified ankle and joints of unspecified foot: Secondary | ICD-10-CM | POA: Diagnosis not present

## 2019-02-27 ENCOUNTER — Ambulatory Visit (HOSPITAL_COMMUNITY)
Admission: RE | Admit: 2019-02-27 | Discharge: 2019-02-27 | Disposition: A | Discharge status: Home / Self Care | Payer: Medicare Other | Source: Ambulatory Visit | Attending: Internal Medicine | Admitting: Internal Medicine

## 2019-02-27 ENCOUNTER — Other Ambulatory Visit: Payer: Self-pay

## 2019-02-27 DIAGNOSIS — R413 Other amnesia: Secondary | ICD-10-CM | POA: Diagnosis not present

## 2019-02-27 DIAGNOSIS — R410 Disorientation, unspecified: Secondary | ICD-10-CM | POA: Insufficient documentation

## 2019-02-27 DIAGNOSIS — G238 Other specified degenerative diseases of basal ganglia: Secondary | ICD-10-CM | POA: Diagnosis not present

## 2019-02-27 DIAGNOSIS — R9089 Other abnormal findings on diagnostic imaging of central nervous system: Secondary | ICD-10-CM | POA: Diagnosis not present

## 2019-02-27 IMAGING — CT CT HEAD W/O CM
3 series · 16 of 45 positions shown, 19 images · non-contrast
Comparison: None.

CLINICAL DATA: Confusion, memory loss over last year

EXAM:
CT HEAD WITHOUT CONTRAST
TECHNIQUE: Contiguous axial images were obtained from the base of the skull
through the vertex without intravenous contrast.

[Series 2: head w o · axial · 0.45mm/px · z∈[+67,+182]mm · 10 of 28 slices shown, 13 images]
[im 3/28  brain]
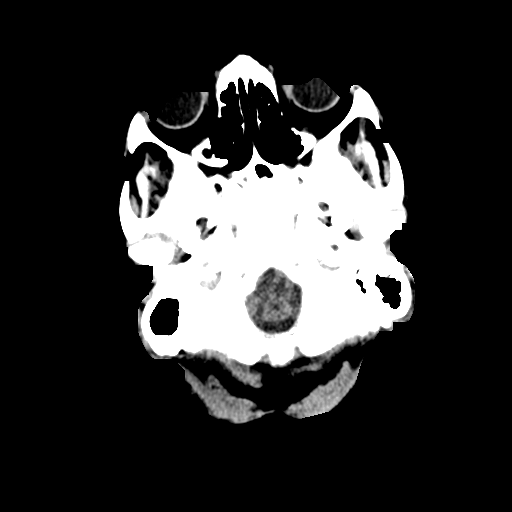
[im 3/28  bone]
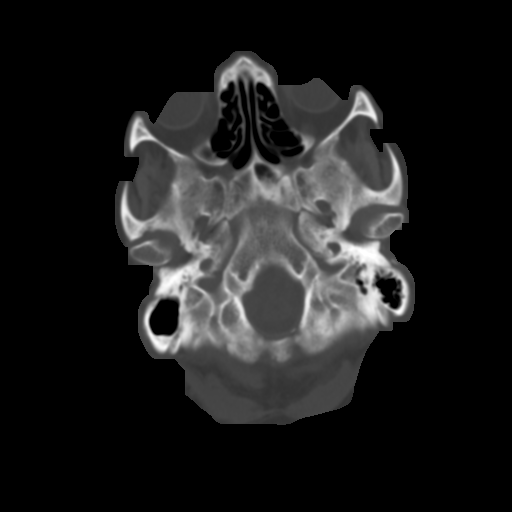
[im 5/28  brain]
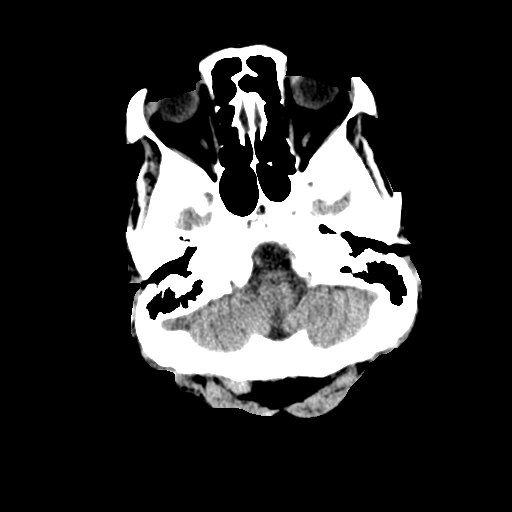
[im 8/28  brain]
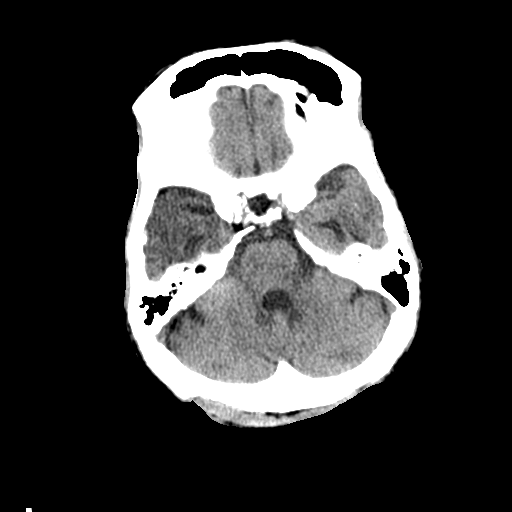
[im 11/28  brain]
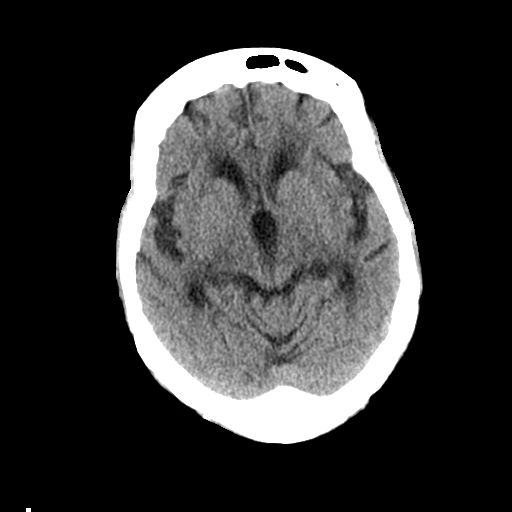
[im 13/28  brain]
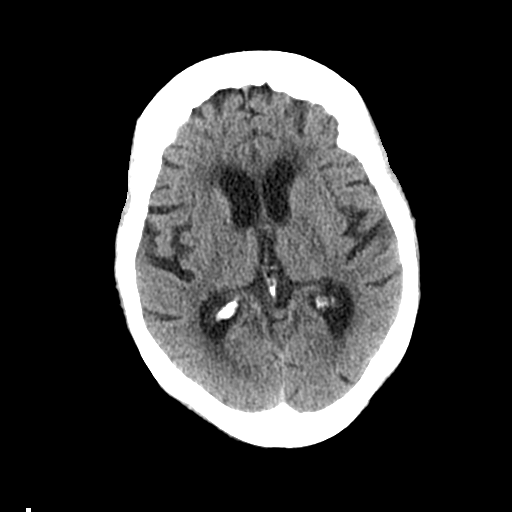
[im 13/28  bone]
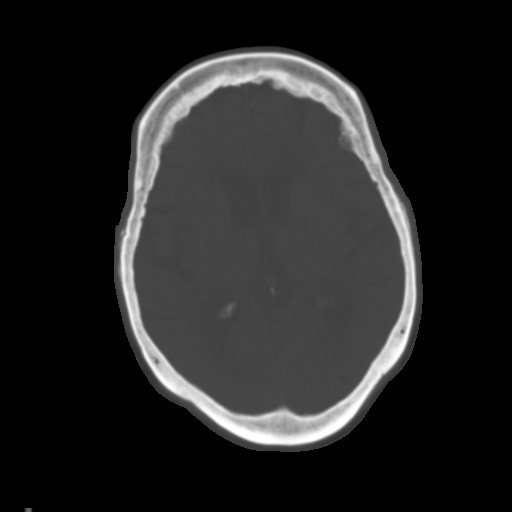
[im 16/28  brain]
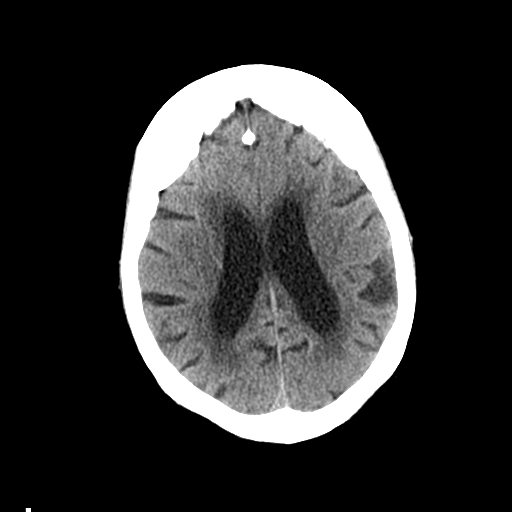
[im 18/28  brain]
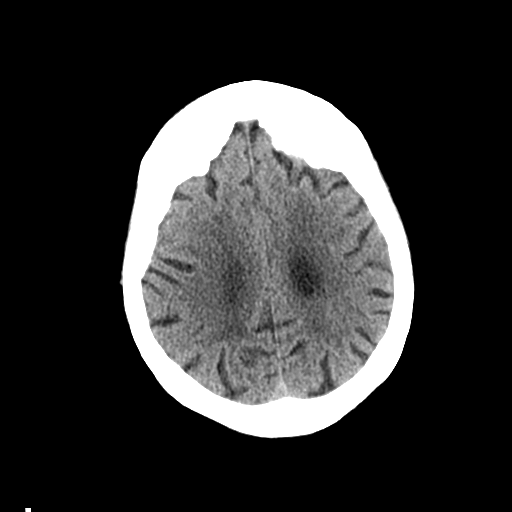
[im 21/28  brain]
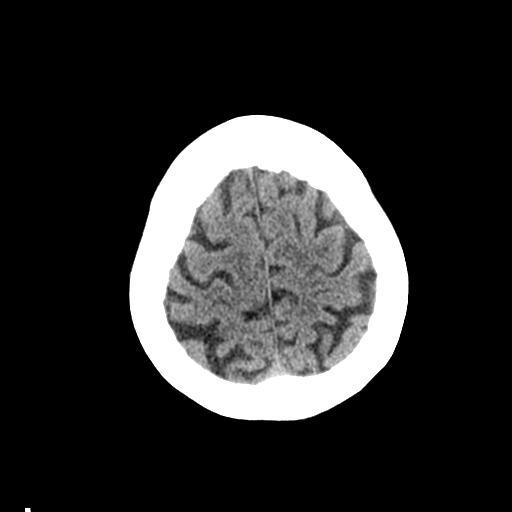
[im 24/28  brain]
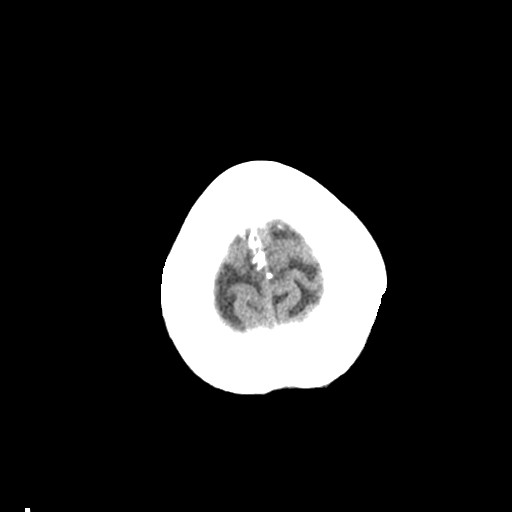
[im 24/28  bone]
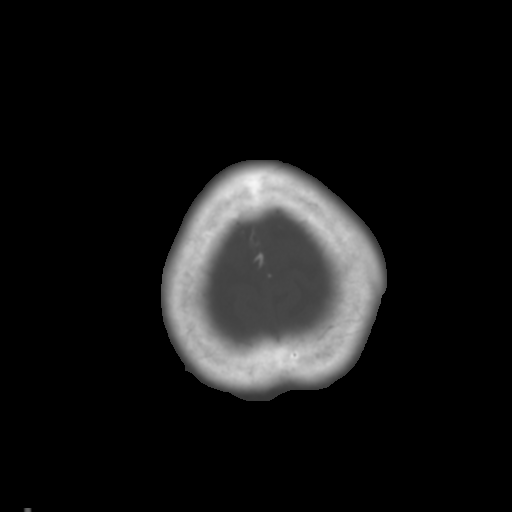
[im 26/28  brain]
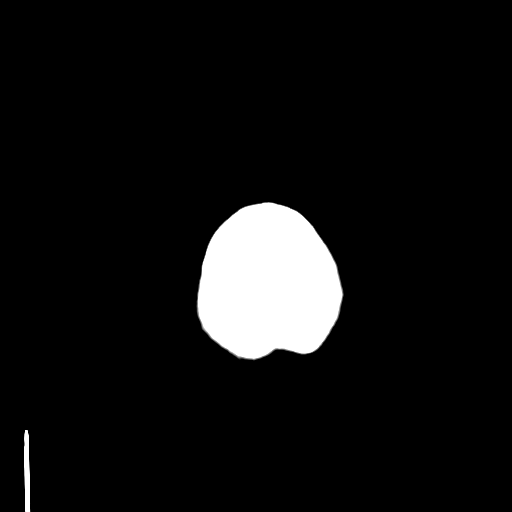

[Series 4: coronal soft · coronal · 0.33mm/px · 3 of 69 slices shown]
[im 23/69  brain]
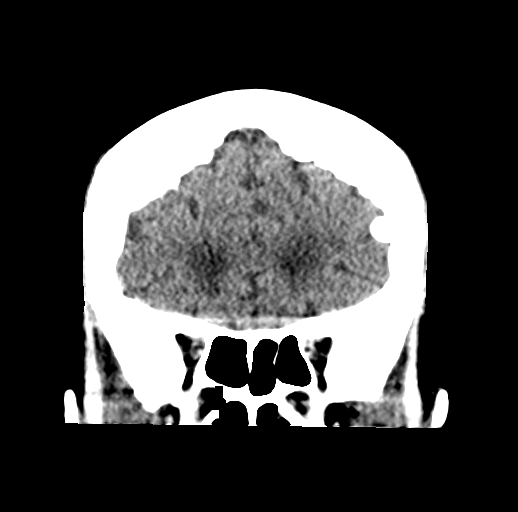
[im 31/69  brain]
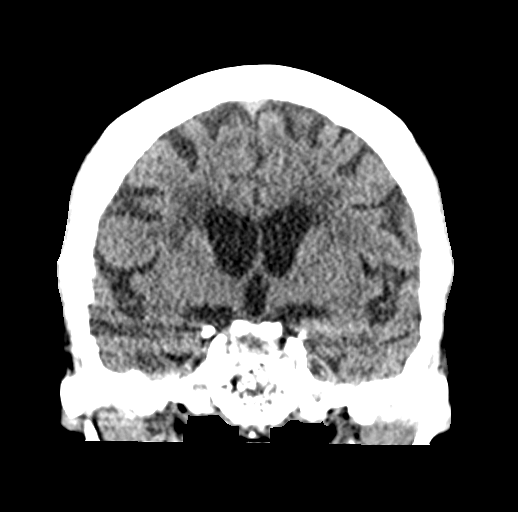
[im 38/69  brain]
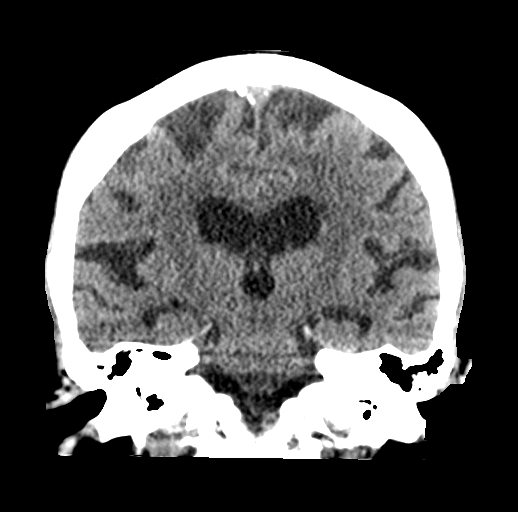

[Series 5: sagittal soft · sagittal · 0.31mm/px · 3 of 53 slices shown]
[im 18/53  brain]
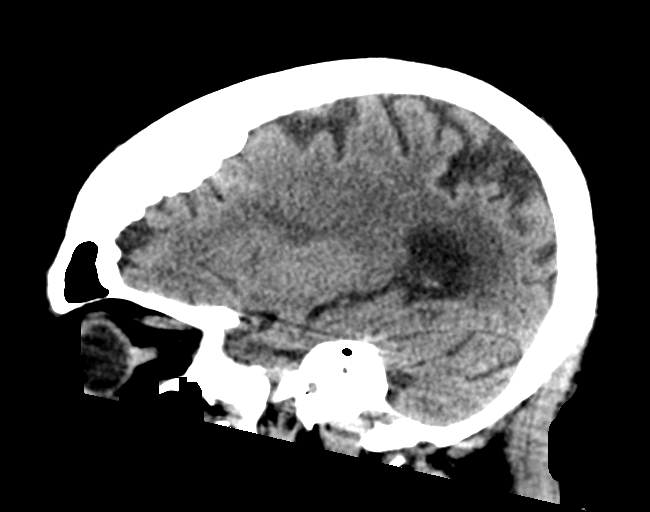
[im 27/53  brain]
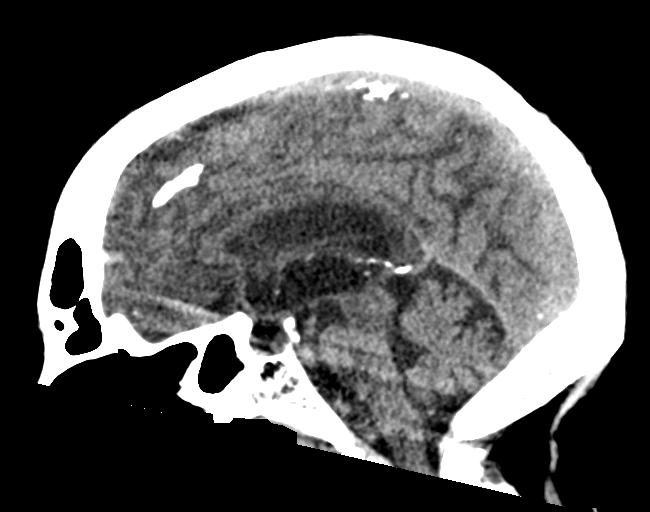
[im 35/53  brain]
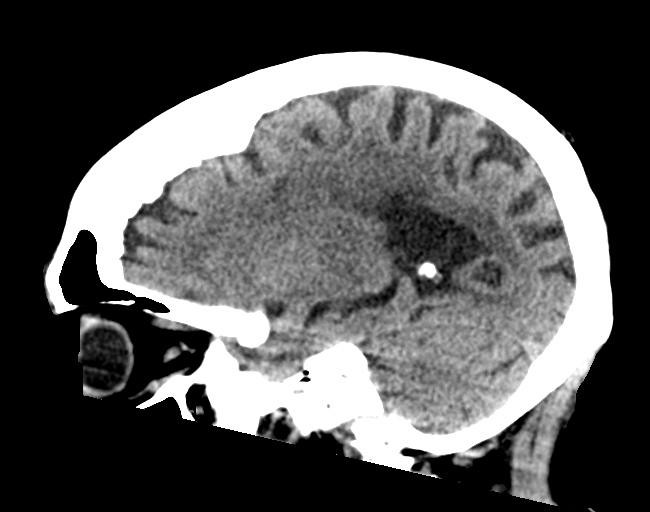

[16 of 45 positions shown; findings below may reference images not displayed]

FINDINGS: Brain: Unenhanced images of the brain demonstrate hypodensities in
the bilateral periventricular white matter and basal ganglia, likely
reflecting chronic small vessel ischemic changes. The lateral
ventricles and midline structures are otherwise unremarkable. No
acute extra-axial fluid collections. No mass effect.

Vascular: No hyperdense vessel or unexpected calcification.

Skull: Normal. Negative for fracture or focal lesion.

Sinuses/Orbits: No acute finding.

Other: None
IMPRESSION: 1. Likely chronic small vessel ischemic changes throughout the white
matter and basal ganglia. No acute intracranial process.

## 2019-03-09 ENCOUNTER — Other Ambulatory Visit (HOSPITAL_COMMUNITY): Payer: Self-pay | Admitting: Internal Medicine

## 2019-03-09 ENCOUNTER — Other Ambulatory Visit: Payer: Self-pay | Admitting: Internal Medicine

## 2019-03-09 DIAGNOSIS — R634 Abnormal weight loss: Secondary | ICD-10-CM

## 2019-03-20 ENCOUNTER — Other Ambulatory Visit: Payer: Self-pay

## 2019-03-20 ENCOUNTER — Ambulatory Visit (HOSPITAL_COMMUNITY)
Admission: RE | Admit: 2019-03-20 | Discharge: 2019-03-20 | Disposition: A | Discharge status: Home / Self Care | Payer: Medicare Other | Source: Ambulatory Visit | Attending: Internal Medicine | Admitting: Internal Medicine

## 2019-03-20 DIAGNOSIS — R634 Abnormal weight loss: Secondary | ICD-10-CM | POA: Insufficient documentation

## 2019-03-20 DIAGNOSIS — K8020 Calculus of gallbladder without cholecystitis without obstruction: Secondary | ICD-10-CM | POA: Diagnosis not present

## 2019-03-20 IMAGING — CT CT ABDOMEN W/O CM
2 of 4 series · 15 of 46 positions shown, 17 images · non-contrast
Comparison: None.

CLINICAL DATA: Unintentional 50 lb weight loss in last year.

EXAM:
CT ABDOMEN WITHOUT CONTRAST
TECHNIQUE: Multidetector CT imaging of the abdomen was performed following the
standard protocol without IV contrast.

[Series 2: axial st · axial · 0.82mm/px · z∈[+1228,+1464]mm · 12 of 57 slices shown, 14 images]
[im 5/57  soft-tissue]
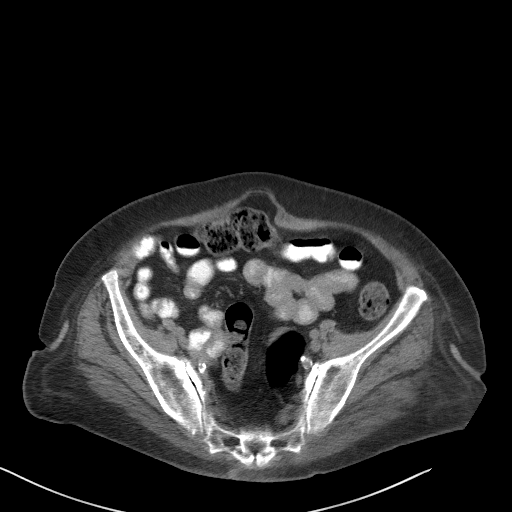
[im 5/57  bone]
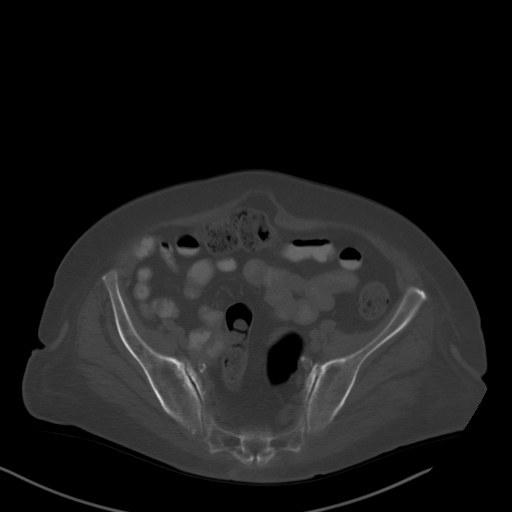
[im 9/57  soft-tissue]
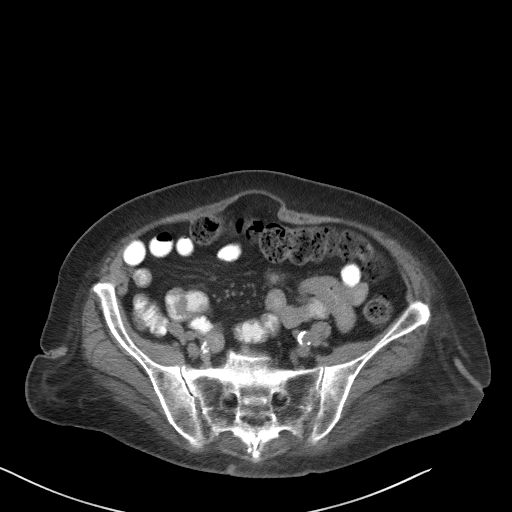
[im 13/57  soft-tissue]
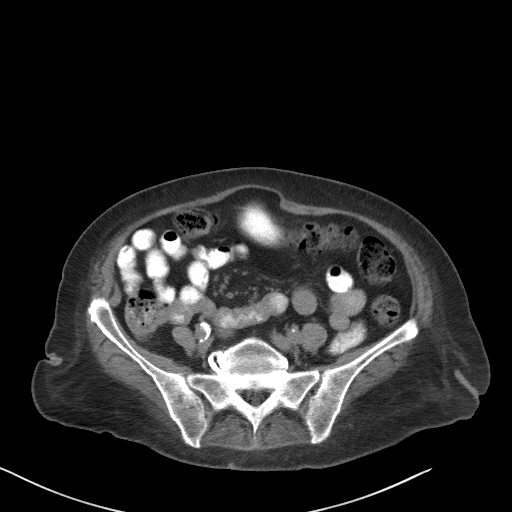
[im 18/57  soft-tissue]
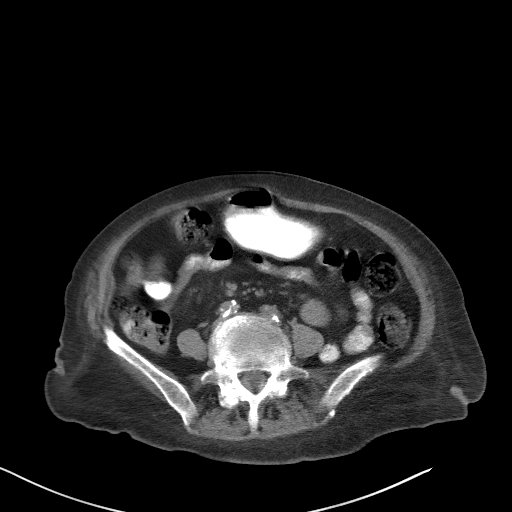
[im 22/57  soft-tissue]
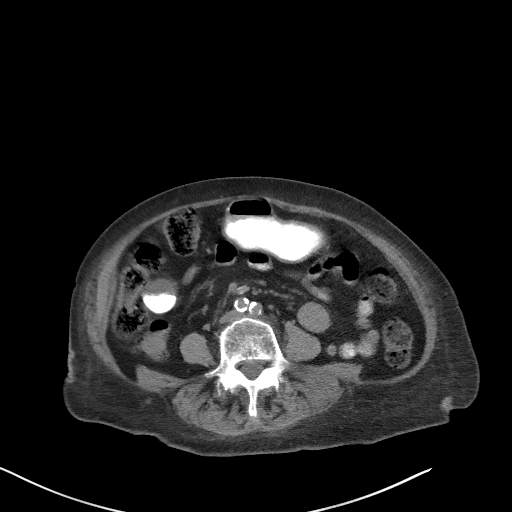
[im 26/57  soft-tissue]
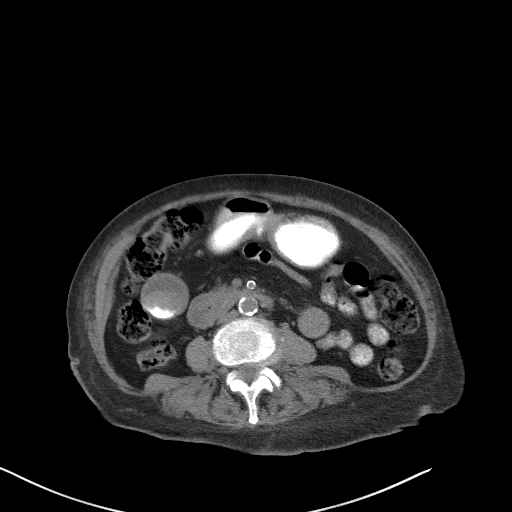
[im 31/57  soft-tissue]
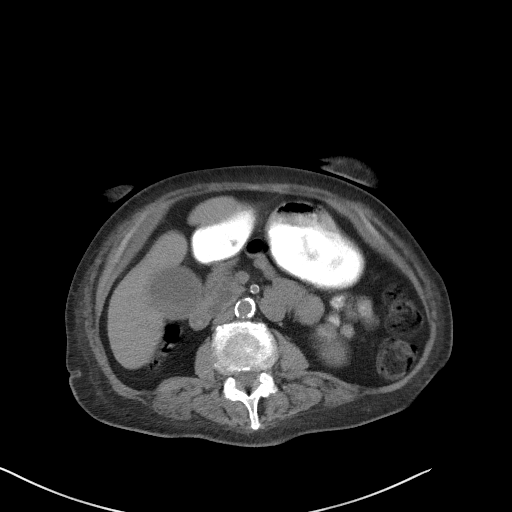
[im 35/57  soft-tissue]
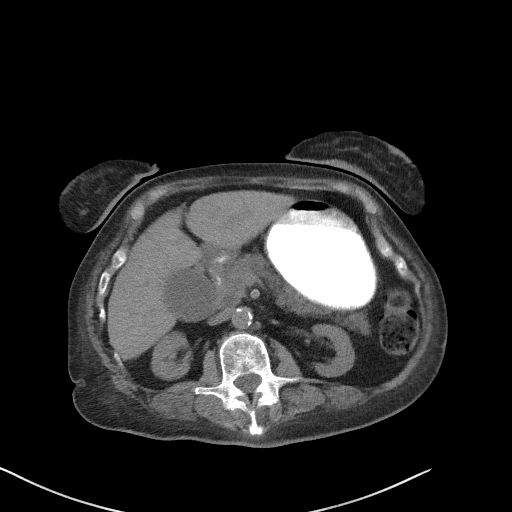
[im 39/57  soft-tissue]
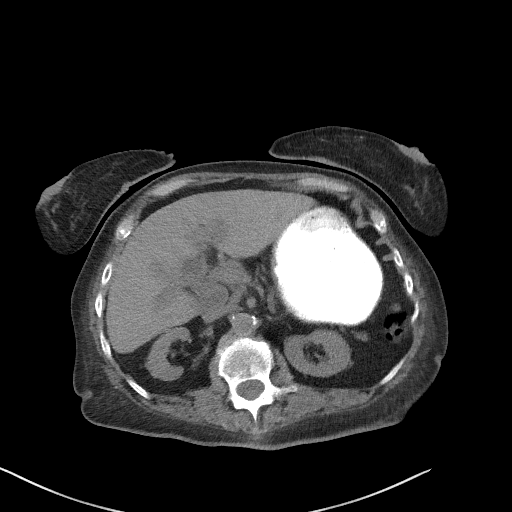
[im 39/57  bone]
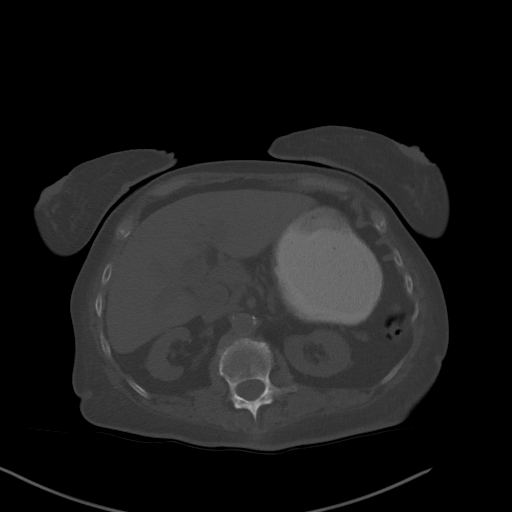
[im 44/57  soft-tissue]
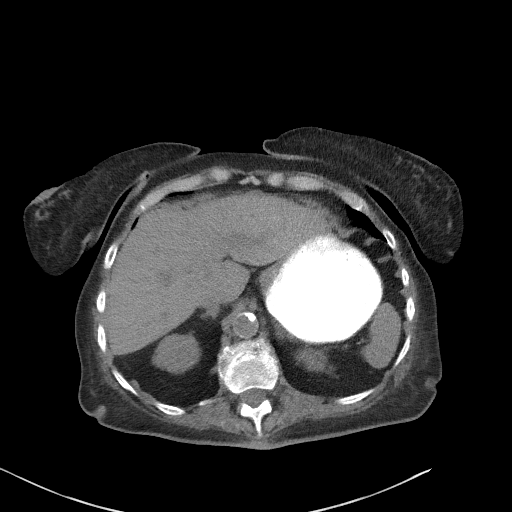
[im 48/57  soft-tissue]
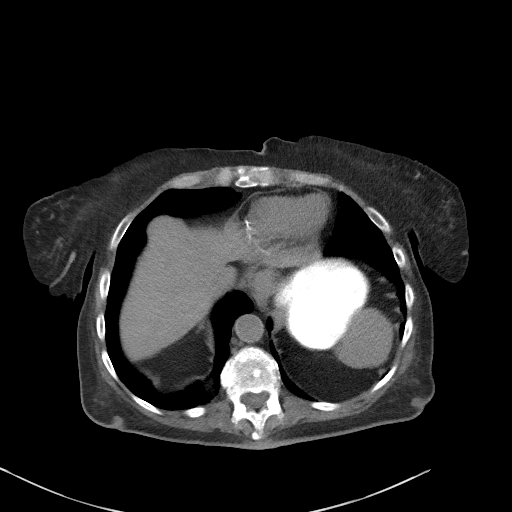
[im 52/57  soft-tissue]
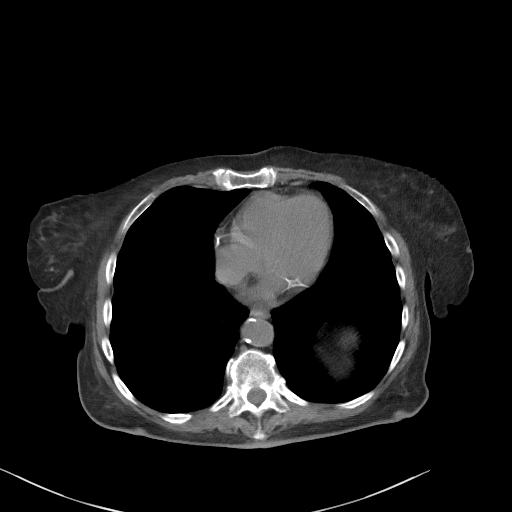

[Series 5: coronal st · coronal · 0.57mm/px · 3 of 97 slices shown]
[im 33/97  soft-tissue]
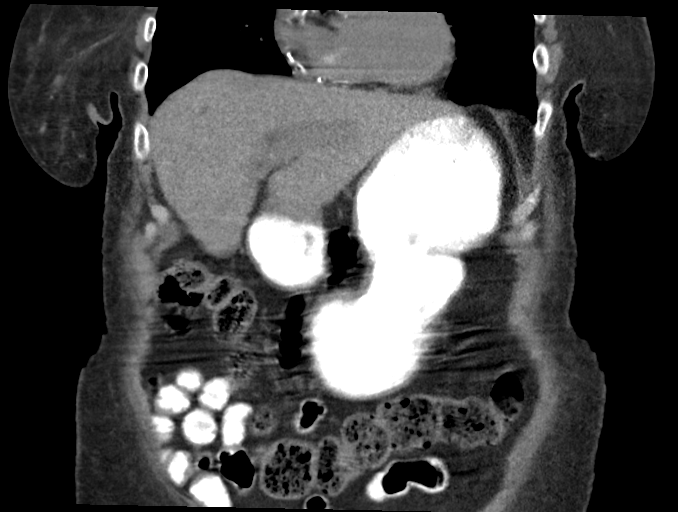
[im 43/97  soft-tissue]
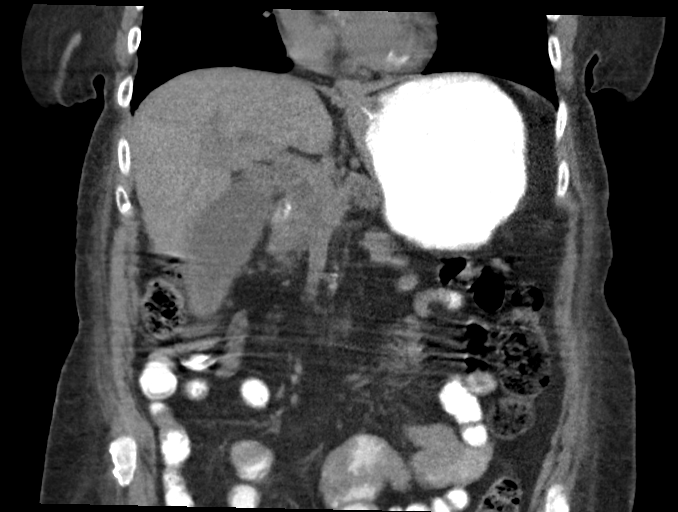
[im 54/97  soft-tissue]
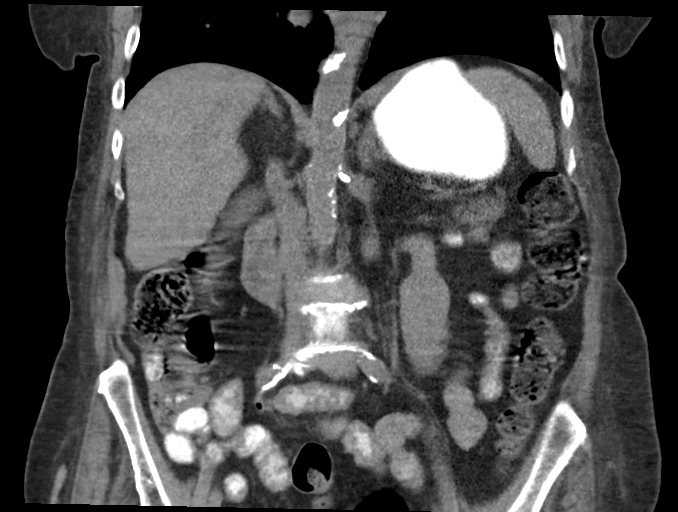

[15 of 46 positions shown; findings below may reference images not displayed]

FINDINGS: Lower chest: No acute findings.

Hepatobiliary: No masses visualized on this unenhanced exam.
Gallbladder is dilated and contains numerous tiny calcified
gallstones as well as gallbladder sludge. Mild gallbladder wall
thickening is seen, without evidence of acute pericholecystic
inflammatory changes or fluid collections. Diffuse biliary ductal
dilatation is seen, with common bile duct measuring 14 mm in
diameter.

Pancreas: No mass or inflammatory process visualized on this
unenhanced exam.

Spleen:  Within normal limits in size.

Adrenals/Urinary tract: Unremarkable. No evidence of nephrolithiasis
or hydronephrosis.

Stomach/Bowel: Visualized portion unremarkable.

Vascular/Lymphatic: No pathologically enlarged lymph nodes
identified. No evidence of abdominal aortic aneurysm. Aortic
atherosclerosis incidentally noted.

Other:  None.

Musculoskeletal:  No suspicious bone lesions identified.
IMPRESSION: 1. Dilated gallbladder with cholelithiasis and mild wall thickening.
This is suspicious for cholecystitis. Recommend clinical
correlation, and consider nuclear medicine hepatobiliary scan for
further evaluation.
2. Diffuse biliary ductal dilatation, with common bile duct
measuring 14 mm in diameter. Recommend correlation with liver
function tests, and consider MRCP or ERCP for further evaluation.

## 2019-03-23 DIAGNOSIS — K8080 Other cholelithiasis without obstruction: Secondary | ICD-10-CM | POA: Diagnosis not present

## 2019-03-23 DIAGNOSIS — M79672 Pain in left foot: Secondary | ICD-10-CM | POA: Diagnosis not present

## 2019-03-23 DIAGNOSIS — E1129 Type 2 diabetes mellitus with other diabetic kidney complication: Secondary | ICD-10-CM | POA: Diagnosis not present

## 2019-03-23 DIAGNOSIS — M79671 Pain in right foot: Secondary | ICD-10-CM | POA: Diagnosis not present

## 2019-03-23 DIAGNOSIS — M25579 Pain in unspecified ankle and joints of unspecified foot: Secondary | ICD-10-CM | POA: Diagnosis not present

## 2019-03-23 DIAGNOSIS — R413 Other amnesia: Secondary | ICD-10-CM | POA: Diagnosis not present

## 2019-03-26 ENCOUNTER — Encounter: Payer: Self-pay | Admitting: Internal Medicine

## 2019-04-02 DIAGNOSIS — N183 Chronic kidney disease, stage 3 unspecified: Secondary | ICD-10-CM | POA: Diagnosis not present

## 2019-04-02 DIAGNOSIS — E039 Hypothyroidism, unspecified: Secondary | ICD-10-CM | POA: Diagnosis not present

## 2019-04-02 DIAGNOSIS — Z79899 Other long term (current) drug therapy: Secondary | ICD-10-CM | POA: Diagnosis not present

## 2019-04-02 DIAGNOSIS — E1129 Type 2 diabetes mellitus with other diabetic kidney complication: Secondary | ICD-10-CM | POA: Diagnosis not present

## 2019-04-09 DIAGNOSIS — R413 Other amnesia: Secondary | ICD-10-CM | POA: Diagnosis not present

## 2019-04-09 DIAGNOSIS — K8080 Other cholelithiasis without obstruction: Secondary | ICD-10-CM | POA: Diagnosis not present

## 2019-04-13 ENCOUNTER — Other Ambulatory Visit: Payer: Self-pay

## 2019-04-13 ENCOUNTER — Encounter: Payer: Self-pay | Admitting: Gastroenterology

## 2019-04-13 ENCOUNTER — Ambulatory Visit (INDEPENDENT_AMBULATORY_CARE_PROVIDER_SITE_OTHER): Payer: Medicare Other | Admitting: Gastroenterology

## 2019-04-13 DIAGNOSIS — R630 Anorexia: Secondary | ICD-10-CM | POA: Diagnosis not present

## 2019-04-13 DIAGNOSIS — K838 Other specified diseases of biliary tract: Secondary | ICD-10-CM | POA: Insufficient documentation

## 2019-04-13 DIAGNOSIS — R932 Abnormal findings on diagnostic imaging of liver and biliary tract: Secondary | ICD-10-CM | POA: Diagnosis not present

## 2019-04-13 DIAGNOSIS — R634 Abnormal weight loss: Secondary | ICD-10-CM | POA: Diagnosis not present

## 2019-04-13 NOTE — Assessment & Plan Note (Signed)
Pleasant 86 y/o female presenting for further evaluation of poor appetite, early satiety, unintentional weight loss. She has also had some memory disturbances as outlined.   Work up showed bump in alk phos, AST, mild anemia back in 02/2019. CT Abd without contrast: dilated gallbladder with stones and mild wall thickening, diffuse biliary dilatation, with CBD 14 mm. Repeat labs 04/02/19 with essentially normal LFTs, normal Hgb, improved eGFR of 46.   Patient's family is apprehensive about her being able to cooperate/tolerate MRCP given her memory issues. I will discuss further with radiology/Dr. Gala Romney for next best test. We may be able to get more information with contrast CT to rule out underlying malignancy although CBD stones can be missed on CT. Further recommendations to follow. Patient and family aware to notify us if she develops abdominal pain, fever, vomiting.

## 2019-04-13 NOTE — Progress Notes (Signed)
Primary Care Physician:  Asencion Noble, MD  Primary Gastroenterologist:  Garfield Cornea, MD   Chief Complaint  Patient presents with  . dilated gallbladder    HPI:  Debra [name] is a 86 y.o. female here at the request of Dr. Willey Blade for further evaluation of the gallbladder, cholelithiasis, biliary dilatation.  Presents with daughter. Patient has CT abdomen without contrast dated on March 20, 2019 showing dilated gallbladder with cholelithiasis and mild wall thickening suspicious for cholecystitis.  Diffuse biliary ductal dilatation with common bile duct measuring 14 mm.  Patient has memory issues.  Possible evolving dementia.  Being worked up by PCP. She knows her name and DOB. She knows what town she lives in. Does not know year or president. Provides limited history today. Daughter helps provide history. Decline in oral intake over several months. Complains of early satiety. No vomiting, dysphagia, abdominal pain. Some constipation issues but manages with stool softener and MOM. No melena, brbpr.   Several years ago she weight in the 240 range. They can't remember how long ago. Slow decline in weight over the years. She has lost 55 pounds in the past 3 years as documented. No prior colonoscopy.   Wt Readings from Last 3 Encounters:  04/13/19 140 lb 9.6 oz (63.8 kg)  01/18/16 195 lb (88.5 kg)  12/21/15 189 lb (85.7 kg)    Spoke with patient's son over the phone. He states they Dr. Willey Blade and he discussed possible MRCP but did not feel like patient would be able to cooperate/tolerate the test.    Current Outpatient Medications  Medication Sig Dispense Refill  . amLODipine (NORVASC) 10 MG tablet Take 10 mg by mouth daily.    Marland Kitchen aspirin EC 81 MG tablet Take 81 mg by mouth daily.    Marland Kitchen atorvastatin (LIPITOR) 80 MG tablet Take 80 mg by mouth daily at 6 PM.    . colchicine 0.6 MG tablet Take 0.6 mg by mouth daily.    Marland Kitchen latanoprost (XALATAN) 0.005 % ophthalmic solution 1 drop at bedtime.     Marland Kitchen losartan-hydrochlorothiazide (HYZAAR) 100-25 MG tablet Take 1 tablet by mouth daily.    . meclizine (ANTIVERT) 25 MG tablet Take 25 mg by mouth 3 (three) times daily as needed for dizziness.    . metFORMIN (GLUCOPHAGE-XR) 500 MG 24 hr tablet Take 500 mg by mouth 2 (two) times daily.    Marland Kitchen labetalol (NORMODYNE) 100 MG tablet Take 100 mg by mouth 2 (two) times daily.     No current facility-administered medications for this visit.    Allergies as of 04/13/2019 - Review Complete 04/13/2019  Allergen Reaction Noted  . Other Nausea And Vomiting 04/27/2015    Past Medical History:  Diagnosis Date  . CKD (chronic kidney disease)   . Diabetes mellitus without complication ([place])   . Gout   . Hypercholesteremia   . Hypertension     Past Surgical History:  Procedure Laterality Date  . TOTAL KNEE ARTHROPLASTY Left     Family History  Problem Relation Age of Onset  . Diabetes Mother   . Hypertension Father   . Breast cancer Niece   . Colon cancer Neg Hx     Social History   Socioeconomic History  . Marital status: Single    Spouse name: Not on file  . Number of children: Not on file  . Years of education: Not on file  . Highest education level: Not on file  Occupational History  . Not on file  Tobacco Use  . Smoking status: Never Smoker  . Smokeless tobacco: Never Used  Substance and Sexual Activity  . Alcohol use: No  . Drug use: No  . Sexual activity: Not on file  Other Topics Concern  . Not on file  Social History Narrative  . Not on file   Social Determinants of Health   Financial Resource Strain:   . Difficulty of Paying Living Expenses:   Food Insecurity:   . Worried About Charity fundraiser in the Last Year:   . Arboriculturist in the Last Year:   Transportation Needs:   . Film/video editor (Medical):   Marland Kitchen Lack of Transportation (Non-Medical):   Physical Activity:   . Days of Exercise per Week:   . Minutes of Exercise per Session:   Stress:   .  Feeling of Stress :   Social Connections:   . Frequency of Communication with Friends and Family:   . Frequency of Social Gatherings with Friends and Family:   . Attends Religious Services:   . Active Member of Clubs or Organizations:   . Attends Archivist Meetings:   Marland Kitchen Marital Status:   Intimate Partner Violence:   . Fear of Current or Ex-Partner:   . Emotionally Abused:   Marland Kitchen Physically Abused:   . Sexually Abused:       ROS: unreliable. Patient denies. States "I feel good"  General: Negative for anorexia, weight loss, fever, chills, fatigue, weakness. Eyes: Negative for vision changes.  ENT: Negative for hoarseness, difficulty swallowing , nasal congestion. CV: Negative for chest pain, angina, palpitations, dyspnea on exertion, peripheral edema.  Respiratory: Negative for dyspnea at rest, dyspnea on exertion, cough, sputum, wheezing.  GI: See history of present illness. GU:  Negative for dysuria, hematuria, urinary incontinence, urinary frequency, nocturnal urination.  MS: Negative for joint pain, low back pain.  Derm: Negative for rash or itching.  Neuro: Negative for weakness, abnormal sensation, seizure, frequent headaches, memory loss, confusion.  Psych: Negative for anxiety, depression, suicidal ideation, hallucinations.  Endo: Negative for unusual weight change.  Heme: Negative for bruising or bleeding. Allergy: Negative for rash or hives.    Physical Examination:  BP (!) 88/55   Pulse 92   Temp (!) 97.1 F (36.2 C) (Oral)   Wt 140 lb 9.6 oz (63.8 kg)   BMI 22.69 kg/m    General: Well-nourished, well-developed in no acute distress. Accompanied by daughter Head: Normocephalic, atraumatic.   Eyes: Conjunctiva pink, no icterus. Mouth: masked Neck: Supple without thyromegaly, masses, or lymphadenopathy.  Lungs: Clear to auscultation bilaterally.  Heart: Regular rate and rhythm, no murmurs rubs or gallops.  Abdomen: Bowel sounds are normal, nontender,  nondistended, no hepatosplenomegaly or masses, no abdominal bruits or    hernia , no rebound or guarding.   Rectal: not performed Extremities: No lower extremity edema. No clubbing or deformities.  Neuro: Alert and oriented x 4 , grossly normal neurologically.  Skin: Warm and dry, no rash or jaundice.   Psych: Alert and cooperative, normal mood and affect.  Labs: Labs from 02/18/2019: Glucose 99, creatinine 1.56, BUN 21, albumin 2.9, total bilirubin 0.7, alkaline phosphatase 292, AST 58, ALT 39, white blood cell count 7800.,  Hemoglobin 10.8, MCV 91, A1c 5.8, TSH 4.830, B12 1347  Labs from April 02, 2019: Creatinine 1.23, albumin 3.1, total bilirubin 0.4, alkaline phosphatase 128, AST 23, ALT 12, white blood cell count 7600, hemoglobin 11.6 normal, platelets 496,000.  Estimated GFR  of 46  Imaging Studies: CT ABDOMEN WO CONTRAST  Result Date: 03/21/2019 CLINICAL DATA:  Unintentional 50 lb weight loss in last year. EXAM: CT ABDOMEN WITHOUT CONTRAST TECHNIQUE: Multidetector CT imaging of the abdomen was performed following the standard protocol without IV contrast. COMPARISON:  None. FINDINGS: Lower chest: No acute findings. Hepatobiliary: No masses visualized on this unenhanced exam. Gallbladder is dilated and contains numerous tiny calcified gallstones as well as gallbladder sludge. Mild gallbladder wall thickening is seen, without evidence of acute pericholecystic inflammatory changes or fluid collections. Diffuse biliary ductal dilatation is seen, with common bile duct measuring 14 mm in diameter. Pancreas: No mass or inflammatory process visualized on this unenhanced exam. Spleen:  Within normal limits in size. Adrenals/Urinary tract: Unremarkable. No evidence of nephrolithiasis or hydronephrosis. Stomach/Bowel: Visualized portion unremarkable. Vascular/Lymphatic: No pathologically enlarged lymph nodes identified. No evidence of abdominal aortic aneurysm. Aortic atherosclerosis incidentally noted.  Other:  None. Musculoskeletal:  No suspicious bone lesions identified. IMPRESSION: 1. Dilated gallbladder with cholelithiasis and mild wall thickening. This is suspicious for cholecystitis. Recommend clinical correlation, and consider nuclear medicine hepatobiliary scan for further evaluation. 2. Diffuse biliary ductal dilatation, with common bile duct measuring 14 mm in diameter. Recommend correlation with liver function tests, and consider MRCP or ERCP for further evaluation. Electronically Signed   By: Marlaine Hind M.D.   On: 03/21/2019 13:57

## 2019-04-13 NOTE — Patient Instructions (Signed)
1. Monitor for signs of abdominal pain, vomiting, fever. If you notice anything concerning, please call or take to ER. 2. We will be in touch in a couple of days with next appropriate imaging test, after I discuss with radiology and Dr. Gala Romney.

## 2019-04-13 NOTE — Progress Notes (Signed)
Cc'ed to pcp °

## 2019-04-20 DIAGNOSIS — E1140 Type 2 diabetes mellitus with diabetic neuropathy, unspecified: Secondary | ICD-10-CM | POA: Diagnosis not present

## 2019-04-20 DIAGNOSIS — I739 Peripheral vascular disease, unspecified: Secondary | ICD-10-CM | POA: Diagnosis not present

## 2019-04-20 DIAGNOSIS — M79672 Pain in left foot: Secondary | ICD-10-CM | POA: Diagnosis not present

## 2019-04-20 DIAGNOSIS — L110 Acquired keratosis follicularis: Secondary | ICD-10-CM | POA: Diagnosis not present

## 2019-04-20 DIAGNOSIS — M79671 Pain in right foot: Secondary | ICD-10-CM | POA: Diagnosis not present

## 2019-05-16 DIAGNOSIS — C801 Malignant (primary) neoplasm, unspecified: Secondary | ICD-10-CM

## 2019-05-16 HISTORY — DX: Malignant (primary) neoplasm, unspecified: C80.1

## 2019-05-18 ENCOUNTER — Emergency Department (HOSPITAL_COMMUNITY): Payer: Medicare Other

## 2019-05-18 ENCOUNTER — Inpatient Hospital Stay (HOSPITAL_COMMUNITY)
Admission: EM | Admit: 2019-05-18 | Discharge: 2019-05-23 | DRG: 435 | Disposition: A | Discharge status: Home Health | Payer: Medicare Other | Attending: Internal Medicine | Admitting: Internal Medicine

## 2019-05-18 ENCOUNTER — Encounter (HOSPITAL_COMMUNITY): Payer: Self-pay | Admitting: Emergency Medicine

## 2019-05-18 ENCOUNTER — Other Ambulatory Visit: Payer: Self-pay

## 2019-05-18 DIAGNOSIS — Z6822 Body mass index (BMI) 22.0-22.9, adult: Secondary | ICD-10-CM

## 2019-05-18 DIAGNOSIS — M109 Gout, unspecified: Secondary | ICD-10-CM | POA: Diagnosis not present

## 2019-05-18 DIAGNOSIS — E1169 Type 2 diabetes mellitus with other specified complication: Secondary | ICD-10-CM | POA: Diagnosis not present

## 2019-05-18 DIAGNOSIS — C259 Malignant neoplasm of pancreas, unspecified: Secondary | ICD-10-CM | POA: Diagnosis present

## 2019-05-18 DIAGNOSIS — N179 Acute kidney failure, unspecified: Secondary | ICD-10-CM | POA: Diagnosis not present

## 2019-05-18 DIAGNOSIS — E1165 Type 2 diabetes mellitus with hyperglycemia: Secondary | ICD-10-CM | POA: Diagnosis present

## 2019-05-18 DIAGNOSIS — Z7984 Long term (current) use of oral hypoglycemic drugs: Secondary | ICD-10-CM

## 2019-05-18 DIAGNOSIS — C241 Malignant neoplasm of ampulla of Vater: Secondary | ICD-10-CM | POA: Diagnosis not present

## 2019-05-18 DIAGNOSIS — E871 Hypo-osmolality and hyponatremia: Secondary | ICD-10-CM | POA: Diagnosis present

## 2019-05-18 DIAGNOSIS — K8689 Other specified diseases of pancreas: Secondary | ICD-10-CM | POA: Diagnosis present

## 2019-05-18 DIAGNOSIS — I10 Essential (primary) hypertension: Secondary | ICD-10-CM | POA: Diagnosis not present

## 2019-05-18 DIAGNOSIS — D631 Anemia in chronic kidney disease: Secondary | ICD-10-CM | POA: Diagnosis present

## 2019-05-18 DIAGNOSIS — E872 Acidosis, unspecified: Secondary | ICD-10-CM | POA: Diagnosis present

## 2019-05-18 DIAGNOSIS — R17 Unspecified jaundice: Principal | ICD-10-CM

## 2019-05-18 DIAGNOSIS — Z79899 Other long term (current) drug therapy: Secondary | ICD-10-CM

## 2019-05-18 DIAGNOSIS — I959 Hypotension, unspecified: Secondary | ICD-10-CM | POA: Diagnosis present

## 2019-05-18 DIAGNOSIS — E43 Unspecified severe protein-calorie malnutrition: Secondary | ICD-10-CM | POA: Diagnosis not present

## 2019-05-18 DIAGNOSIS — K5900 Constipation, unspecified: Secondary | ICD-10-CM | POA: Diagnosis present

## 2019-05-18 DIAGNOSIS — R29810 Facial weakness: Secondary | ICD-10-CM | POA: Diagnosis not present

## 2019-05-18 DIAGNOSIS — K08109 Complete loss of teeth, unspecified cause, unspecified class: Secondary | ICD-10-CM | POA: Diagnosis present

## 2019-05-18 DIAGNOSIS — R7401 Elevation of levels of liver transaminase levels: Secondary | ICD-10-CM | POA: Diagnosis not present

## 2019-05-18 DIAGNOSIS — G459 Transient cerebral ischemic attack, unspecified: Secondary | ICD-10-CM

## 2019-05-18 DIAGNOSIS — R270 Ataxia, unspecified: Secondary | ICD-10-CM | POA: Diagnosis not present

## 2019-05-18 DIAGNOSIS — R4781 Slurred speech: Secondary | ICD-10-CM | POA: Diagnosis not present

## 2019-05-18 DIAGNOSIS — Z20822 Contact with and (suspected) exposure to covid-19: Secondary | ICD-10-CM | POA: Diagnosis present

## 2019-05-18 DIAGNOSIS — R634 Abnormal weight loss: Secondary | ICD-10-CM | POA: Diagnosis not present

## 2019-05-18 DIAGNOSIS — N184 Chronic kidney disease, stage 4 (severe): Secondary | ICD-10-CM | POA: Diagnosis not present

## 2019-05-18 DIAGNOSIS — I129 Hypertensive chronic kidney disease with stage 1 through stage 4 chronic kidney disease, or unspecified chronic kidney disease: Secondary | ICD-10-CM | POA: Diagnosis not present

## 2019-05-18 DIAGNOSIS — F0390 Unspecified dementia without behavioral disturbance: Secondary | ICD-10-CM | POA: Diagnosis present

## 2019-05-18 DIAGNOSIS — E785 Hyperlipidemia, unspecified: Secondary | ICD-10-CM | POA: Diagnosis present

## 2019-05-18 DIAGNOSIS — K8021 Calculus of gallbladder without cholecystitis with obstruction: Secondary | ICD-10-CM | POA: Diagnosis present

## 2019-05-18 DIAGNOSIS — K851 Biliary acute pancreatitis without necrosis or infection: Secondary | ICD-10-CM | POA: Diagnosis not present

## 2019-05-18 DIAGNOSIS — K8309 Other cholangitis: Secondary | ICD-10-CM | POA: Diagnosis present

## 2019-05-18 DIAGNOSIS — Z7982 Long term (current) use of aspirin: Secondary | ICD-10-CM

## 2019-05-18 DIAGNOSIS — K838 Other specified diseases of biliary tract: Secondary | ICD-10-CM | POA: Diagnosis not present

## 2019-05-18 DIAGNOSIS — E1122 Type 2 diabetes mellitus with diabetic chronic kidney disease: Secondary | ICD-10-CM | POA: Diagnosis present

## 2019-05-18 DIAGNOSIS — E876 Hypokalemia: Secondary | ICD-10-CM | POA: Diagnosis present

## 2019-05-18 DIAGNOSIS — Z833 Family history of diabetes mellitus: Secondary | ICD-10-CM

## 2019-05-18 DIAGNOSIS — K8070 Calculus of gallbladder and bile duct without cholecystitis without obstruction: Secondary | ICD-10-CM | POA: Diagnosis not present

## 2019-05-18 DIAGNOSIS — R7989 Other specified abnormal findings of blood chemistry: Secondary | ICD-10-CM | POA: Diagnosis not present

## 2019-05-18 DIAGNOSIS — E119 Type 2 diabetes mellitus without complications: Secondary | ICD-10-CM

## 2019-05-18 DIAGNOSIS — K8033 Calculus of bile duct with acute cholangitis with obstruction: Secondary | ICD-10-CM | POA: Diagnosis not present

## 2019-05-18 DIAGNOSIS — K831 Obstruction of bile duct: Secondary | ICD-10-CM | POA: Diagnosis not present

## 2019-05-18 DIAGNOSIS — R109 Unspecified abdominal pain: Secondary | ICD-10-CM | POA: Diagnosis not present

## 2019-05-18 DIAGNOSIS — R5381 Other malaise: Secondary | ICD-10-CM | POA: Diagnosis not present

## 2019-05-18 DIAGNOSIS — Z8249 Family history of ischemic heart disease and other diseases of the circulatory system: Secondary | ICD-10-CM

## 2019-05-18 DIAGNOSIS — K8050 Calculus of bile duct without cholangitis or cholecystitis without obstruction: Secondary | ICD-10-CM | POA: Diagnosis not present

## 2019-05-18 DIAGNOSIS — I639 Cerebral infarction, unspecified: Secondary | ICD-10-CM | POA: Diagnosis not present

## 2019-05-18 DIAGNOSIS — I6523 Occlusion and stenosis of bilateral carotid arteries: Secondary | ICD-10-CM | POA: Diagnosis not present

## 2019-05-18 DIAGNOSIS — R531 Weakness: Secondary | ICD-10-CM | POA: Diagnosis not present

## 2019-05-18 DIAGNOSIS — D320 Benign neoplasm of cerebral meninges: Secondary | ICD-10-CM | POA: Diagnosis not present

## 2019-05-18 DIAGNOSIS — Z91018 Allergy to other foods: Secondary | ICD-10-CM

## 2019-05-18 LAB — COMPREHENSIVE METABOLIC PANEL
ALT: 175 U/L — ABNORMAL HIGH (ref 0–44)
AST: 478 U/L — ABNORMAL HIGH (ref 15–41)
Albumin: 1.7 g/dL — ABNORMAL LOW (ref 3.5–5.0)
Alkaline Phosphatase: 652 U/L — ABNORMAL HIGH (ref 38–126)
Anion gap: 13 (ref 5–15)
BUN: 25 mg/dL — ABNORMAL HIGH (ref 8–23)
CO2: 23 mmol/L (ref 22–32)
Calcium: 8 mg/dL — ABNORMAL LOW (ref 8.9–10.3)
Chloride: 95 mmol/L — ABNORMAL LOW (ref 98–111)
Creatinine, Ser: 1.49 mg/dL — ABNORMAL HIGH (ref 0.44–1.00)
GFR calc Af Amer: 36 mL/min — ABNORMAL LOW (ref >60)
GFR calc non Af Amer: 31 mL/min — ABNORMAL LOW (ref >60)
Glucose, Bld: 209 mg/dL — ABNORMAL HIGH (ref 70–99)
Potassium: 3.1 mmol/L — ABNORMAL LOW (ref 3.5–5.1)
Sodium: 131 mmol/L — ABNORMAL LOW (ref 135–145)
Total Bilirubin: 6.9 mg/dL — ABNORMAL HIGH (ref 0.3–1.2)
Total Protein: 5.7 g/dL — ABNORMAL LOW (ref 6.5–8.1)

## 2019-05-18 LAB — URINALYSIS, ROUTINE W REFLEX MICROSCOPIC
Glucose, UA: NEGATIVE mg/dL
Ketones, ur: NEGATIVE mg/dL
Leukocytes,Ua: NEGATIVE
Nitrite: NEGATIVE
Protein, ur: 30 mg/dL — AB
Specific Gravity, Urine: 1.012 (ref 1.005–1.030)
pH: 5 (ref 5.0–8.0)

## 2019-05-18 LAB — GLUCOSE, CAPILLARY: Glucose-Capillary: 149 mg/dL — ABNORMAL HIGH (ref 70–99)

## 2019-05-18 LAB — TYPE AND SCREEN
ABO/RH(D): A NEG
Antibody Screen: NEGATIVE

## 2019-05-18 LAB — CBC WITH DIFFERENTIAL/PLATELET
Abs Immature Granulocytes: 0.03 10*3/uL (ref 0.00–0.07)
Basophils Absolute: 0 10*3/uL (ref 0.0–0.1)
Basophils Relative: 0 %
Eosinophils Absolute: 0 10*3/uL (ref 0.0–0.5)
Eosinophils Relative: 0 %
HCT: 26.1 % — ABNORMAL LOW (ref 36.0–46.0)
Hemoglobin: 8.6 g/dL — ABNORMAL LOW (ref 12.0–15.0)
Immature Granulocytes: 0 %
Lymphocytes Relative: 14 %
Lymphs Abs: 1.2 10*3/uL (ref 0.7–4.0)
MCH: 30.5 pg (ref 26.0–34.0)
MCHC: 33 g/dL (ref 30.0–36.0)
MCV: 92.6 fL (ref 80.0–100.0)
Monocytes Absolute: 0.9 10*3/uL (ref 0.1–1.0)
Monocytes Relative: 11 %
Neutro Abs: 6.1 10*3/uL (ref 1.7–7.7)
Neutrophils Relative %: 75 %
Platelets: 499 10*3/uL — ABNORMAL HIGH (ref 150–400)
RBC: 2.82 MIL/uL — ABNORMAL LOW (ref 3.87–5.11)
RDW: 14.1 % (ref 11.5–15.5)
WBC: 8.2 10*3/uL (ref 4.0–10.5)
nRBC: 0 % (ref 0.0–0.2)

## 2019-05-18 LAB — PROTIME-INR
INR: 1.3 — ABNORMAL HIGH (ref 0.8–1.2)
Prothrombin Time: 15.3 seconds — ABNORMAL HIGH (ref 11.4–15.2)

## 2019-05-18 LAB — MAGNESIUM: Magnesium: 1.7 mg/dL (ref 1.7–2.4)

## 2019-05-18 LAB — LACTIC ACID, PLASMA
Lactic Acid, Venous: 4.5 mmol/L (ref 0.5–1.9)
Lactic Acid, Venous: 4.2 mmol/L (ref 0.5–1.9)
Lactic Acid, Venous: 3 mmol/L (ref 0.5–1.9)

## 2019-05-18 LAB — TROPONIN I (HIGH SENSITIVITY)
Troponin I (High Sensitivity): 30 ng/L — ABNORMAL HIGH (ref <18)
Troponin I (High Sensitivity): 30 ng/L — ABNORMAL HIGH (ref <18)

## 2019-05-18 LAB — RESPIRATORY PANEL BY RT PCR (FLU A&B, COVID)
Influenza A by PCR: NEGATIVE
Influenza B by PCR: NEGATIVE
SARS Coronavirus 2 by RT PCR: NEGATIVE

## 2019-05-18 LAB — HEMOGLOBIN A1C
Hgb A1c MFr Bld: 6.1 % — ABNORMAL HIGH (ref 4.8–5.6)
Mean Plasma Glucose: 128.37 mg/dL

## 2019-05-18 LAB — CREATININE, SERUM
Creatinine, Ser: 1.34 mg/dL — ABNORMAL HIGH (ref 0.44–1.00)
GFR calc Af Amer: 41 mL/min — ABNORMAL LOW (ref >60)
GFR calc non Af Amer: 36 mL/min — ABNORMAL LOW (ref >60)

## 2019-05-18 LAB — LIPASE, BLOOD: Lipase: 1132 U/L — ABNORMAL HIGH (ref 11–51)

## 2019-05-18 LAB — POC OCCULT BLOOD, ED: Fecal Occult Bld: NEGATIVE

## 2019-05-18 IMAGING — CT CT HEAD W/O CM
3 series · 15 of 46 positions shown, 18 images · non-contrast
Comparison: [DATE]

CLINICAL DATA: Ataxia with stroke suspected

EXAM:
CT HEAD WITHOUT CONTRAST
TECHNIQUE: Contiguous axial images were obtained from the base of the skull
through the vertex without intravenous contrast.

[Series 2: head w o · axial · 0.42mm/px · z∈[+6,+126]mm · 9 of 29 slices shown, 12 images]
[im 3/29  brain]
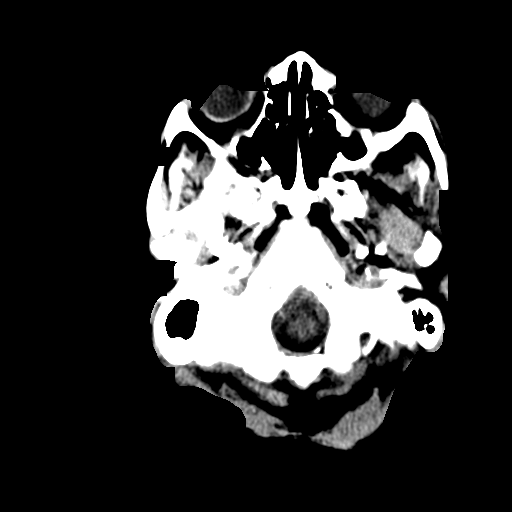
[im 3/29  bone]
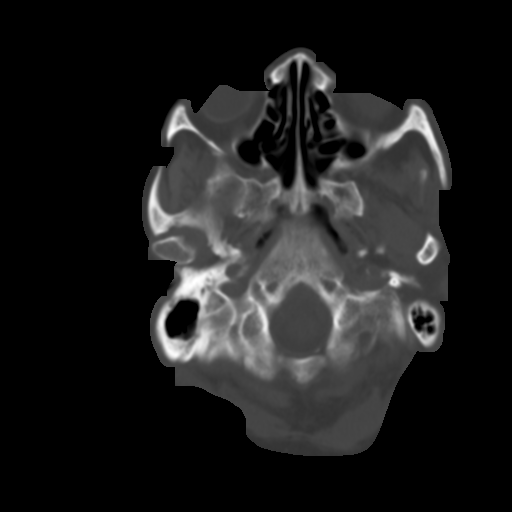
[im 6/29  brain]
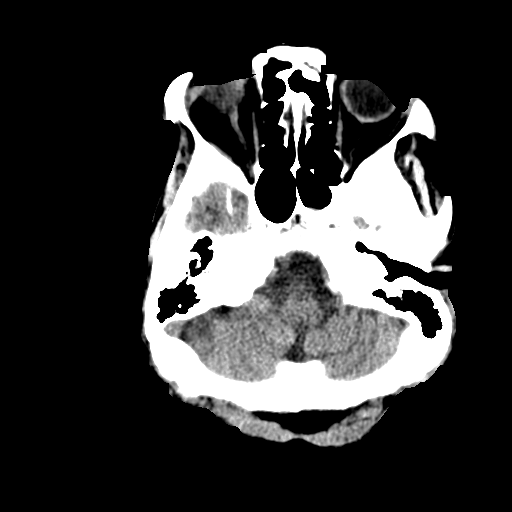
[im 9/29  brain]
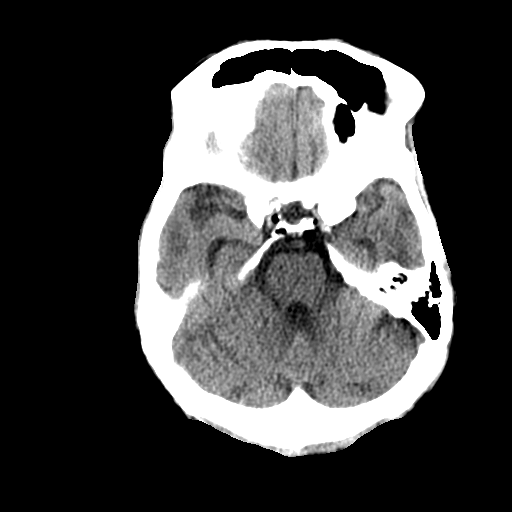
[im 12/29  brain]
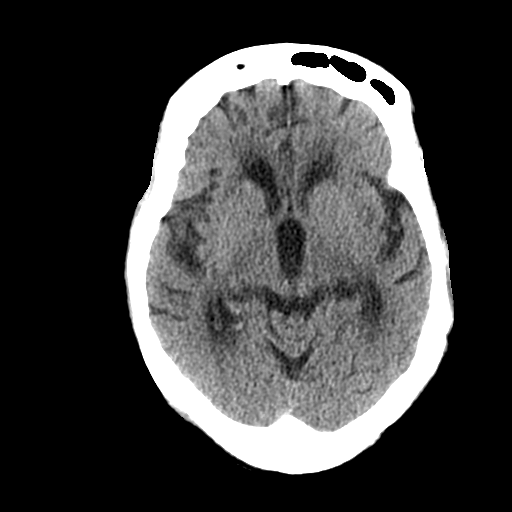
[im 15/29  brain]
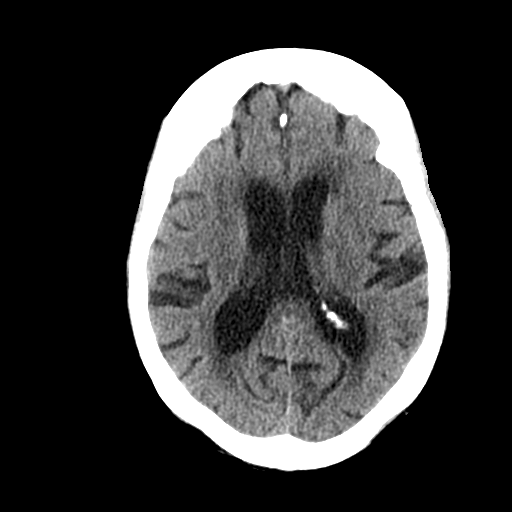
[im 15/29  bone]
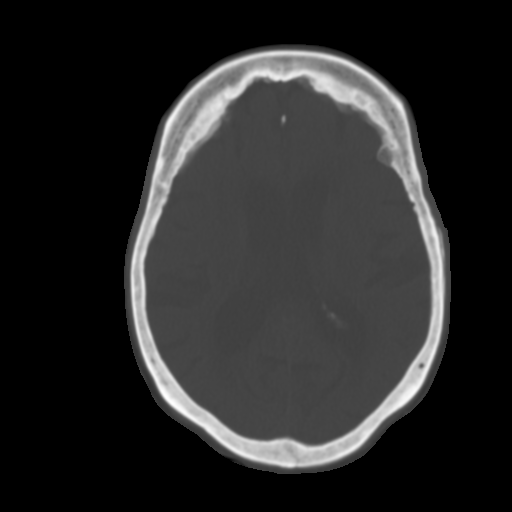
[im 18/29  brain]
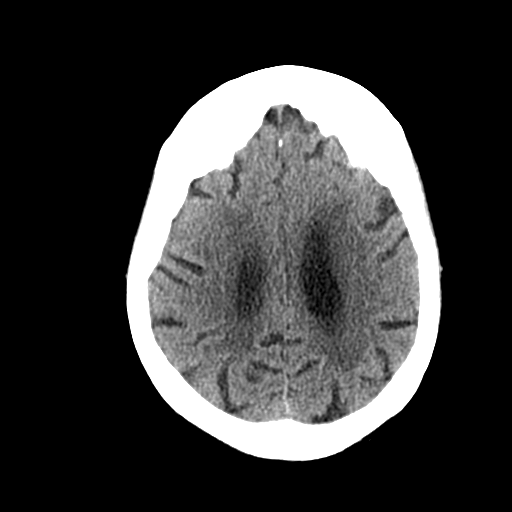
[im 21/29  brain]
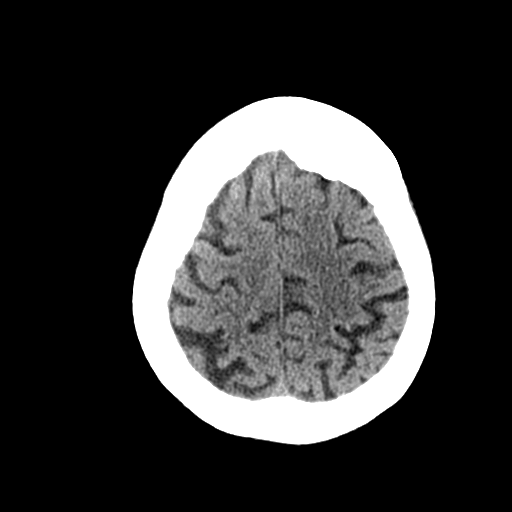
[im 24/29  brain]
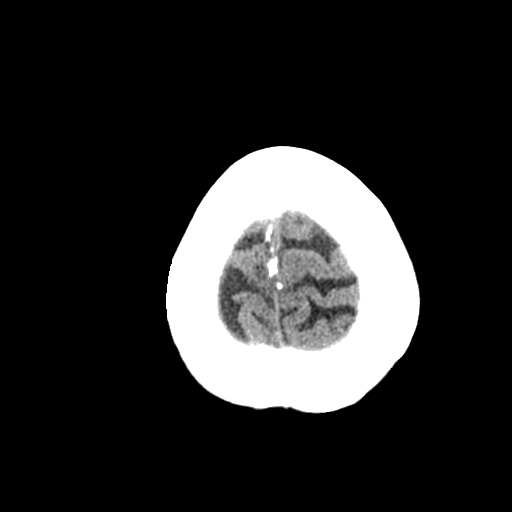
[im 27/29  brain]
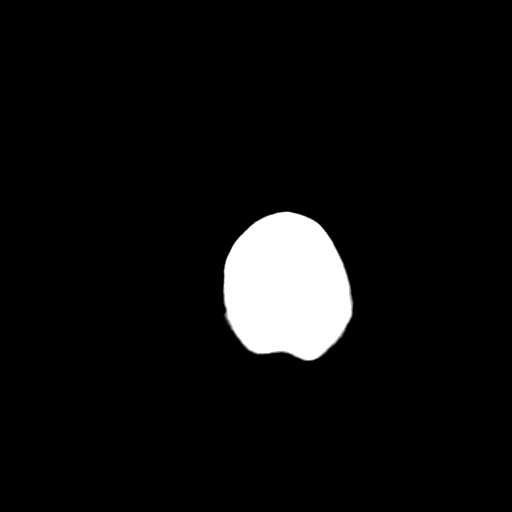
[im 27/29  bone]
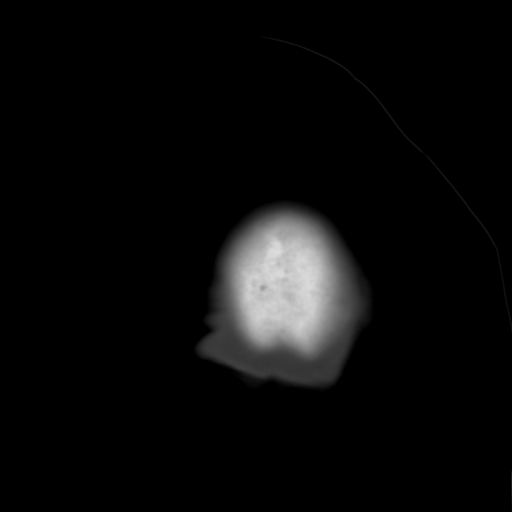

[Series 4: coronal soft · coronal · 0.30mm/px · 3 of 67 slices shown]
[im 23/67  brain]
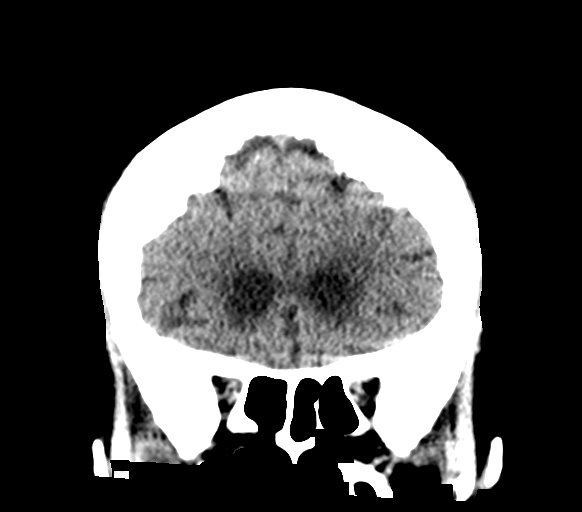
[im 30/67  brain]
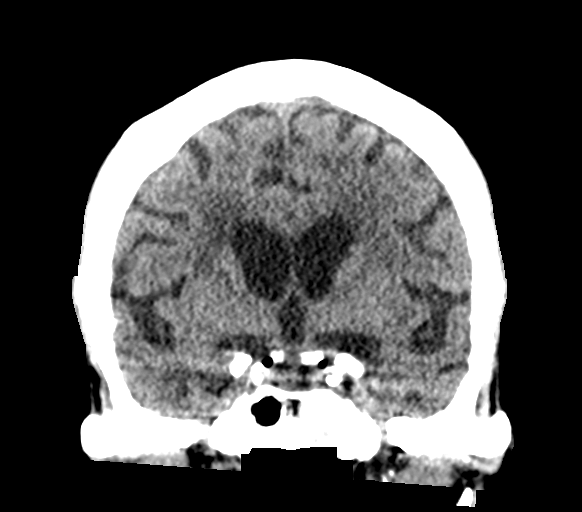
[im 37/67  brain]
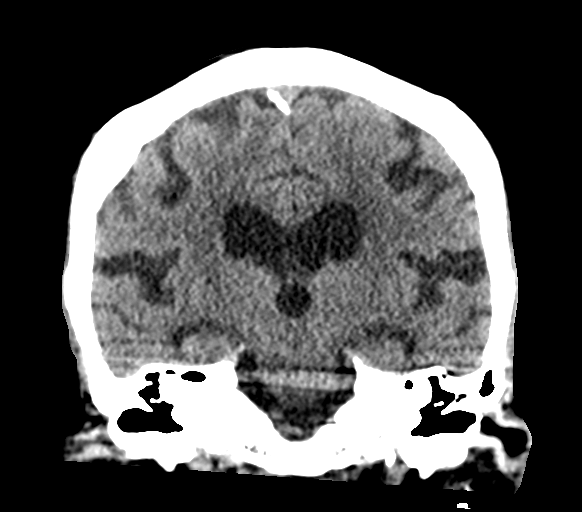

[Series 5: sagittal soft · sagittal · 0.29mm/px · 3 of 50 slices shown]
[im 17/50  brain]
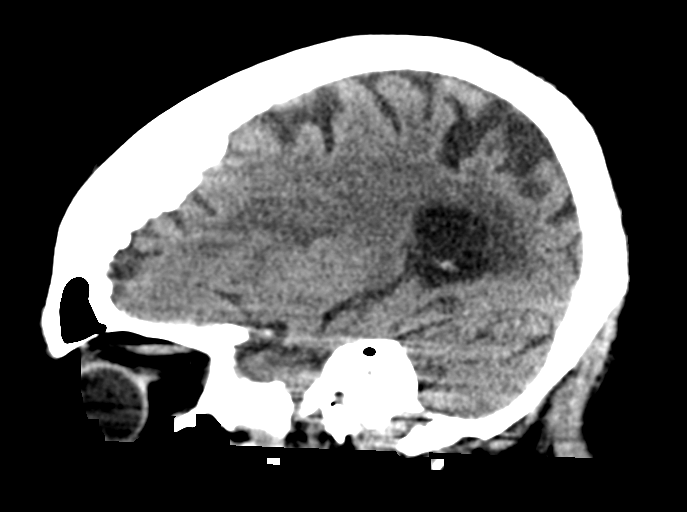
[im 25/50  brain]
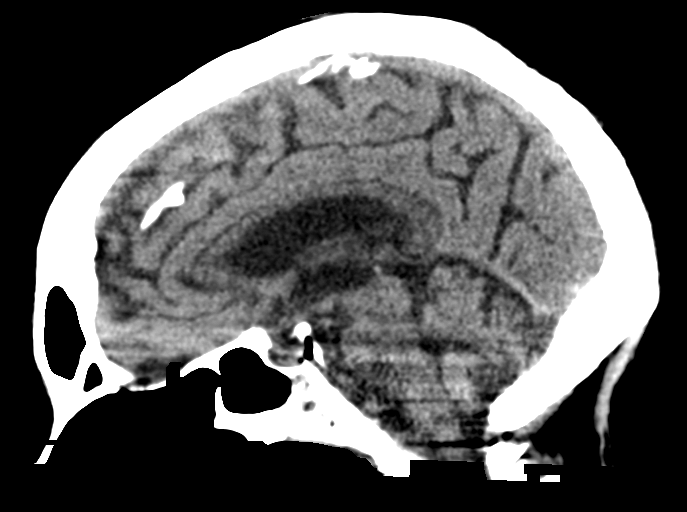
[im 33/50  brain]
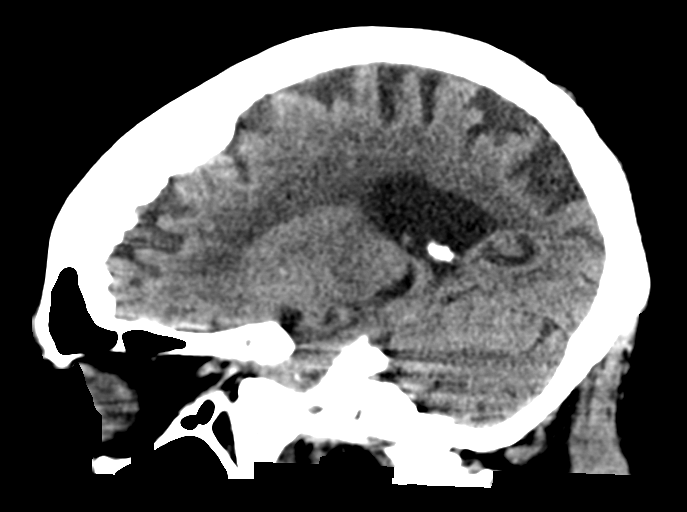

[15 of 46 positions shown; findings below may reference images not displayed]

FINDINGS: Brain: No evidence of acute infarction, hemorrhage, hydrocephalus,
extra-axial collection or mass lesion/mass effect. Chronic small
vessel ischemia in the periventricular white matter. Cerebral volume
loss with ventriculomegaly

Vascular: No hyperdense vessel or unexpected calcification.

Skull: Normal. Negative for fracture or focal lesion.

Sinuses/Orbits: No acute finding.
IMPRESSION: 1. No acute finding.
2. Atrophy and chronic small vessel ischemia.

## 2019-05-18 IMAGING — CT CT ABD-PELV W/ CM
2 of 5 series · 16 of 46 positions shown, 18 images · IV contrast (omnipaque)
Comparison: [DATE]

CLINICAL DATA: Abdominal pain, neutropenia, elevated LFTs

EXAM:
CT ABDOMEN AND PELVIS WITH CONTRAST
TECHNIQUE: Multidetector CT imaging of the abdomen and pelvis was performed
using the standard protocol following bolus administration of
intravenous contrast.
CONTRAST:  75mL OMNIPAQUE IOHEXOL 300 MG/ML  SOLN

[Series 3: axial st · axial · 0.84mm/px · z∈[+865,+1260]mm · 13 of 89 slices shown, 15 images]
[im 5/89  soft-tissue]
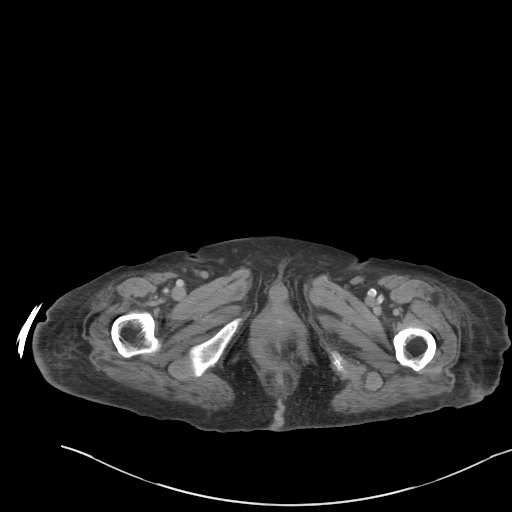
[im 5/89  bone]
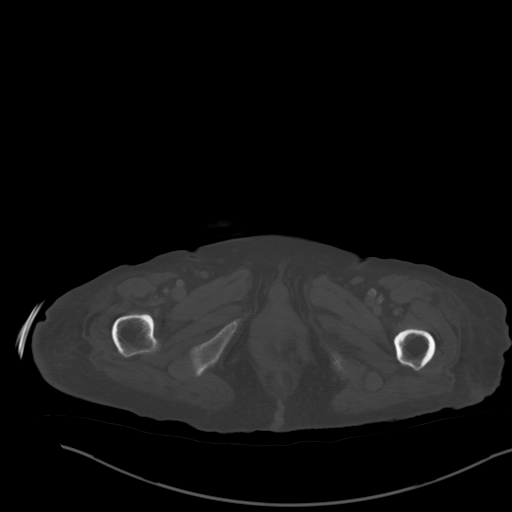
[im 10/89  soft-tissue]
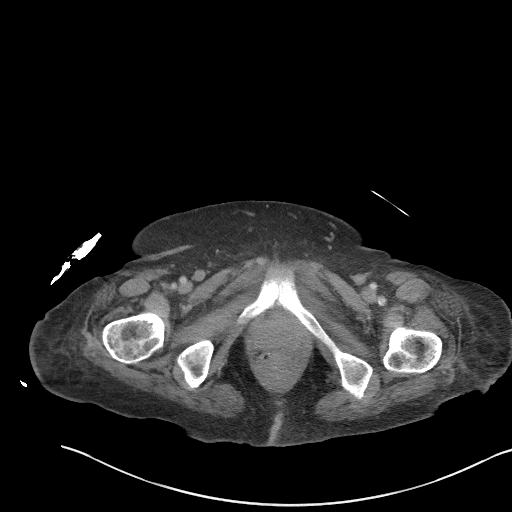
[im 20/89  soft-tissue]
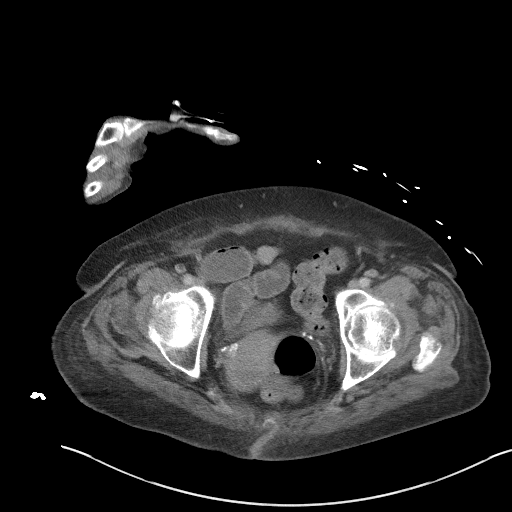
[im 25/89  soft-tissue]
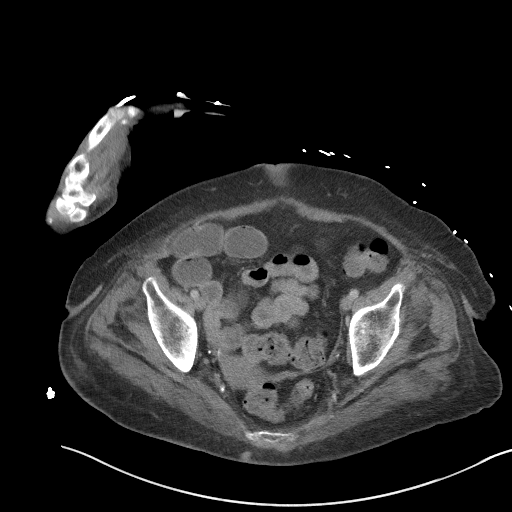
[im 30/89  soft-tissue]
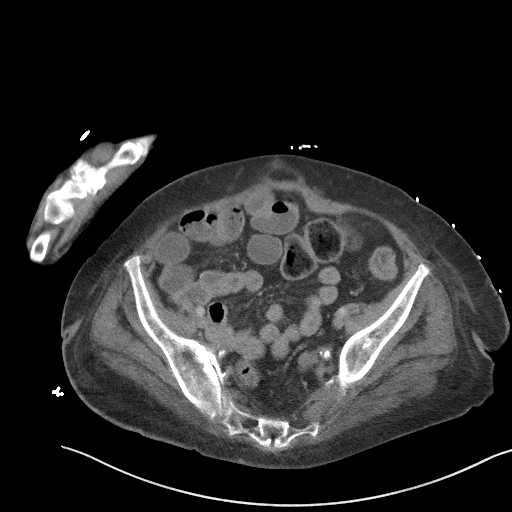
[im 40/89  soft-tissue]
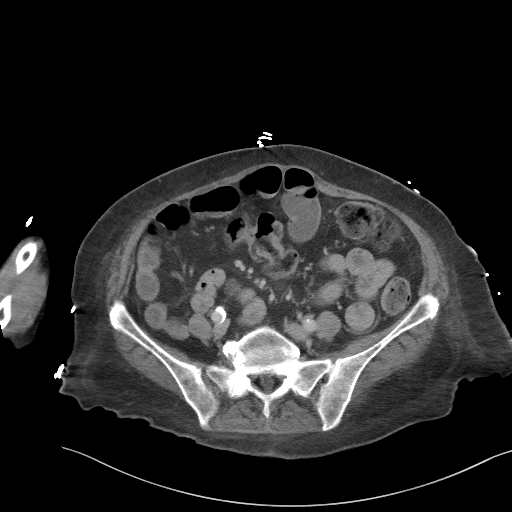
[im 45/89  soft-tissue]
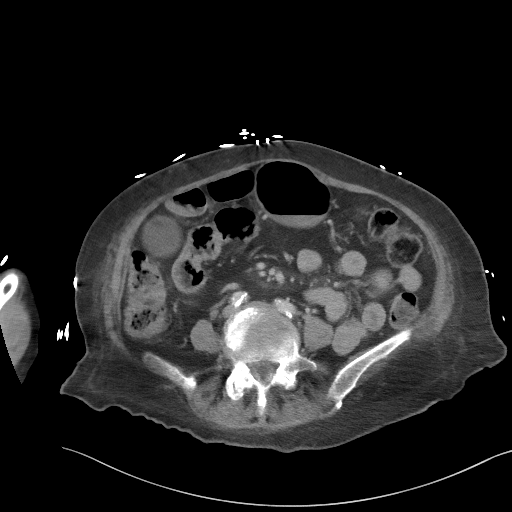
[im 49/89  soft-tissue]
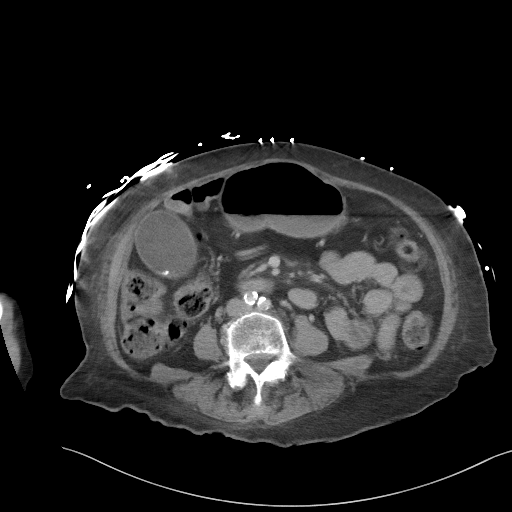
[im 59/89  soft-tissue]
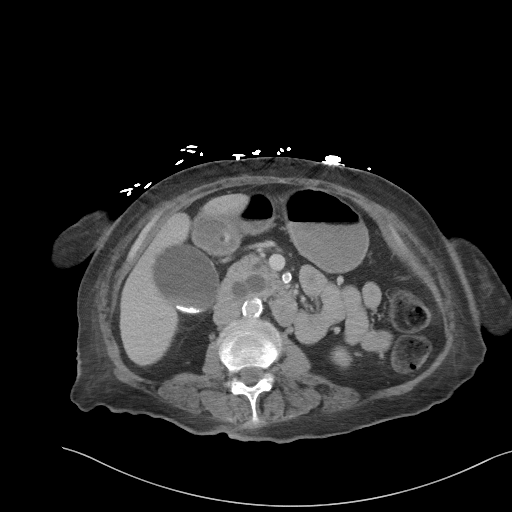
[im 59/89  bone]
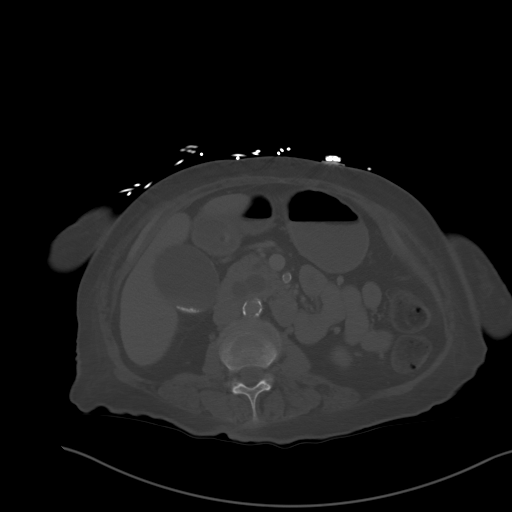
[im 64/89  soft-tissue]
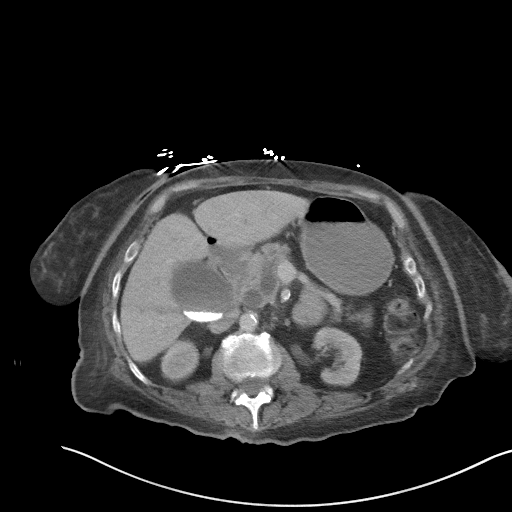
[im 69/89  soft-tissue]
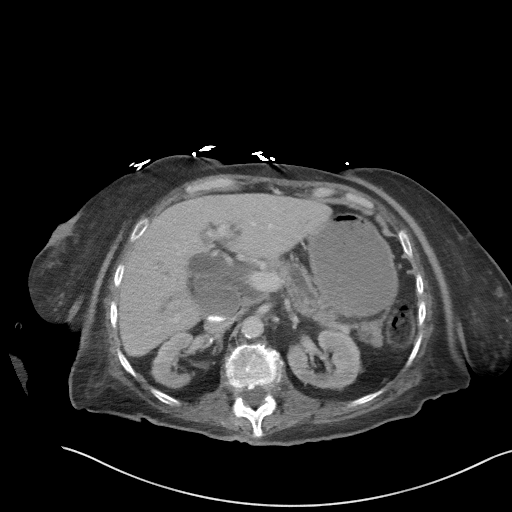
[im 79/89  soft-tissue]
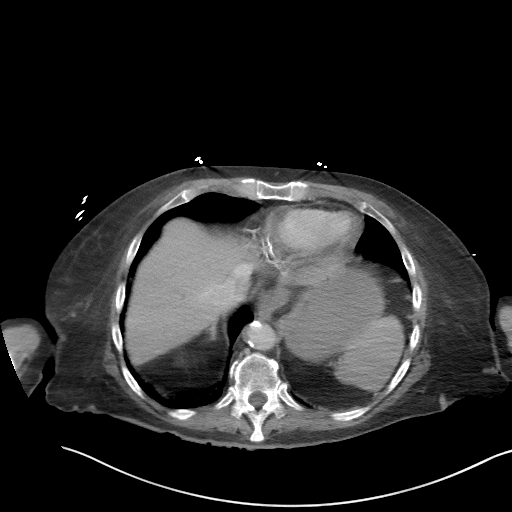
[im 84/89  soft-tissue]
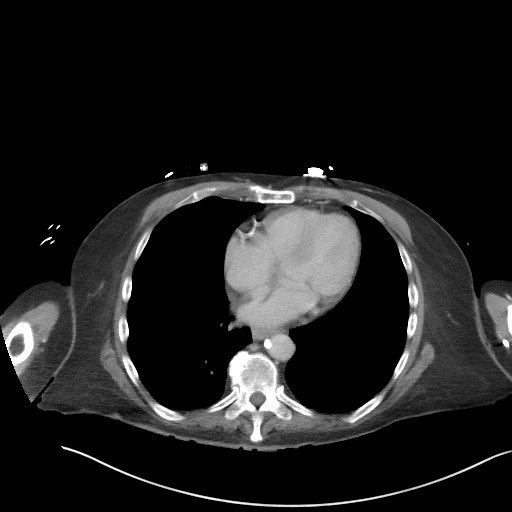

[Series 7: coronal st · coronal · 0.78mm/px · 3 of 106 slices shown]
[im 36/106  soft-tissue]
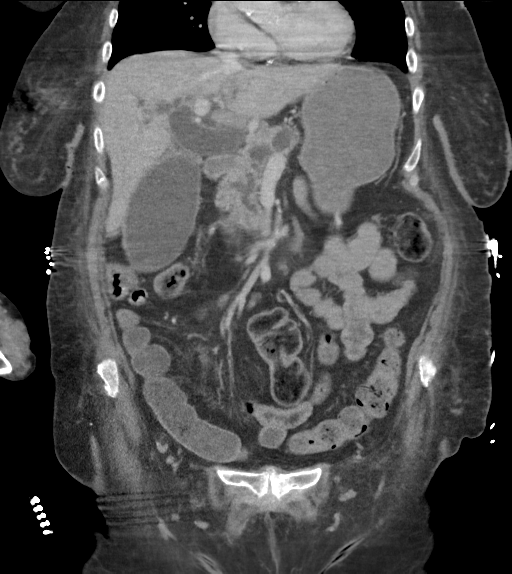
[im 47/106  soft-tissue]
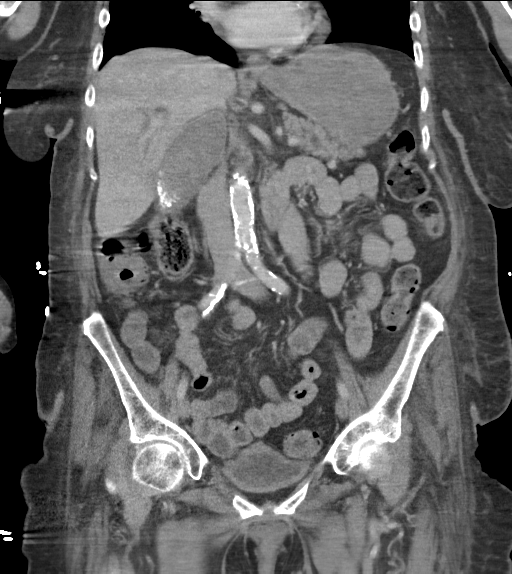
[im 59/106  soft-tissue]
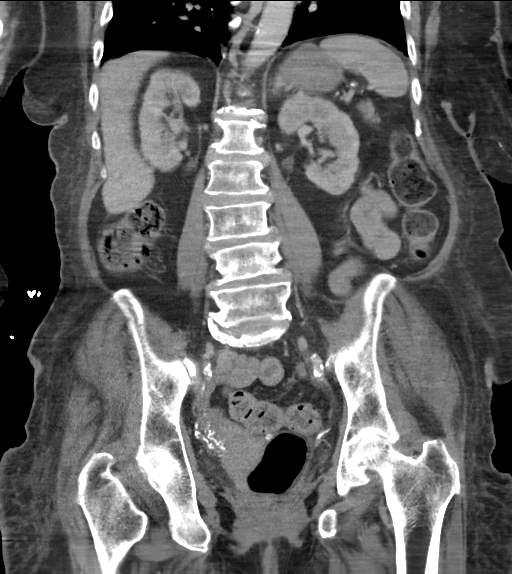

[16 of 46 positions shown; findings below may reference images not displayed]

FINDINGS: Lower chest: No acute abnormality. Three-vessel coronary artery
calcifications.

Hepatobiliary: Redemonstrated profound intra and extrahepatic
biliary ductal dilatation to the ampulla, which is increased
compared prior examination, the common bile duct measuring up to
cm. The gallbladder is distended and contains numerous tiny
gallstones.

Pancreas: The pancreatic duct is profoundly dilated along its length
to the ampulla, measuring up to 1.4 cm centrally.

Spleen: Normal in size without significant abnormality.

Adrenals/Urinary Tract: Adrenal glands are unremarkable. Kidneys are
normal, without renal calculi, solid lesion, or hydronephrosis.
Bladder is unremarkable.

Stomach/Bowel: Stomach is within normal limits. Appendix appears
normal. No evidence of bowel wall thickening, distention, or
inflammatory changes.

Vascular/Lymphatic: Aortic atherosclerosis. No enlarged abdominal or
pelvic lymph nodes.

Reproductive: No mass or other significant abnormality.

Other: No abdominal wall hernia or abnormality. No abdominopelvic
ascites.

Musculoskeletal: No acute or significant osseous findings.
IMPRESSION: 1. Profound intra and extrahepatic biliary ductal dilatation in
addition to dilatation of the pancreatic duct, slightly increased
compared to prior examination dated [DATE] and highly concerning
for a small ampullary or pancreatic malignancy which is not directly
visualized.

2.  Cholelithiasis.

3.  Coronary artery disease.  Aortic Atherosclerosis ([A2]-[A2]).

## 2019-05-18 IMAGING — DX DG CHEST 1V PORT
1 series · 1 of 1 positions shown · non-contrast
Comparison: [DATE].

CLINICAL DATA: Weakness and slurred speech.

EXAM:
PORTABLE CHEST 1 VIEW

[chest ap]
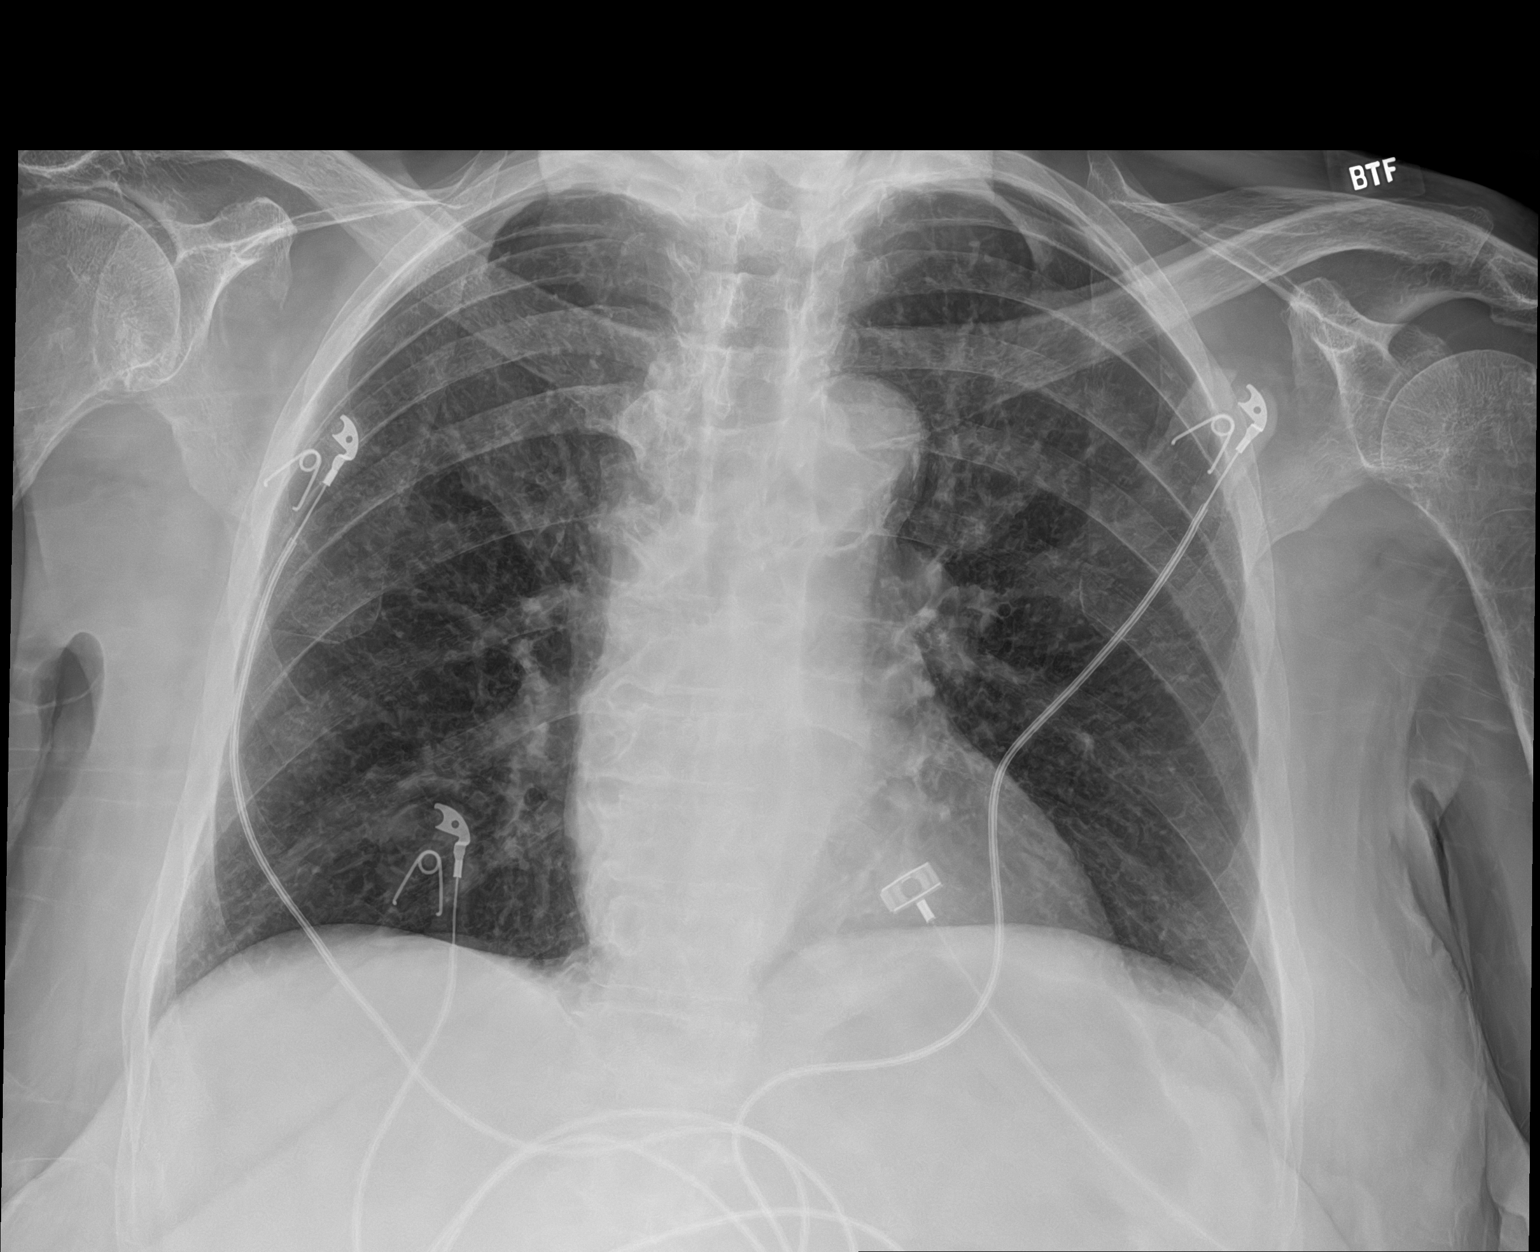

[1 of 1 positions shown; findings below may reference images not displayed]

FINDINGS: Trachea is midline. Heart size normal. Thoracic aorta is calcified.
Lungs are clear. No pleural fluid.
IMPRESSION: 1. No acute findings.
2.  Aortic atherosclerosis ([AP]-[AP]).

## 2019-05-18 MED ORDER — POTASSIUM CHLORIDE 10 MEQ/100ML IV SOLN: 10.0000 meq | INTRAVENOUS | Status: DC

## 2019-05-18 MED ORDER — POTASSIUM CHLORIDE 10 MEQ/100ML IV SOLN
10.0000 meq | Freq: Once | INTRAVENOUS | Status: AC
  Administered 2019-05-18: 10 meq via INTRAVENOUS
  Filled 2019-05-18: qty 100

## 2019-05-18 MED ORDER — SODIUM CHLORIDE 0.9 % IV BOLUS
1000.0000 mL | Freq: Once | INTRAVENOUS | Status: AC
  Administered 2019-05-18: 1000 mL via INTRAVENOUS

## 2019-05-18 MED ORDER — SODIUM CHLORIDE 0.9 % IV SOLN
1.0000 g | Freq: Once | INTRAVENOUS | Status: AC
  Administered 2019-05-18: 1 g via INTRAVENOUS
  Filled 2019-05-18: qty 10

## 2019-05-18 MED ORDER — ACETAMINOPHEN 325 MG PO TABS: 650.0000 mg | ORAL_TABLET | Freq: Four times a day (QID) | ORAL | Status: DC | PRN

## 2019-05-18 MED ORDER — POTASSIUM CHLORIDE 2 MEQ/ML IV SOLN: INTRAVENOUS | Status: DC

## 2019-05-18 MED ORDER — LATANOPROST 0.005 % OP SOLN
1.0000 [drp] | Freq: Every day | OPHTHALMIC | Status: DC
  Administered 2019-05-18 – 2019-05-22 (×5): 1 [drp] via OPHTHALMIC
  Filled 2019-05-18: qty 2.5

## 2019-05-18 MED ORDER — ASPIRIN EC 81 MG PO TBEC
81.0000 mg | DELAYED_RELEASE_TABLET | Freq: Every day | ORAL | Status: DC
  Filled 2019-05-18: qty 1

## 2019-05-18 MED ORDER — INSULIN ASPART 100 UNIT/ML ~~LOC~~ SOLN
0.0000 [IU] | Freq: Every day | SUBCUTANEOUS | Status: DC
  Administered 2019-05-21: 3 [IU] via SUBCUTANEOUS
  Administered 2019-05-22: 2 [IU] via SUBCUTANEOUS

## 2019-05-18 MED ORDER — IOHEXOL 300 MG/ML  SOLN
75.0000 mL | Freq: Once | INTRAMUSCULAR | Status: AC | PRN
  Administered 2019-05-18: 75 mL via INTRAVENOUS

## 2019-05-18 MED ORDER — ACETAMINOPHEN 650 MG RE SUPP: 650.0000 mg | Freq: Four times a day (QID) | RECTAL | Status: DC | PRN

## 2019-05-18 MED ORDER — HEPARIN SODIUM (PORCINE) 5000 UNIT/ML IJ SOLN
5000.0000 [IU] | Freq: Three times a day (TID) | INTRAMUSCULAR | Status: DC
  Administered 2019-05-18 – 2019-05-23 (×14): 5000 [IU] via SUBCUTANEOUS
  Filled 2019-05-18 (×14): qty 1

## 2019-05-18 MED ORDER — ONDANSETRON HCL 4 MG/2ML IJ SOLN: 4.0000 mg | Freq: Four times a day (QID) | INTRAMUSCULAR | Status: DC | PRN

## 2019-05-18 MED ORDER — ONDANSETRON HCL 4 MG PO TABS
4.0000 mg | ORAL_TABLET | Freq: Four times a day (QID) | ORAL | Status: DC | PRN
  Administered 2019-05-21: 4 mg via ORAL
  Filled 2019-05-18: qty 1

## 2019-05-18 MED ORDER — POTASSIUM CHLORIDE 2 MEQ/ML IV SOLN
INTRAVENOUS | Status: DC
  Filled 2019-05-18 (×4): qty 1000

## 2019-05-18 MED ORDER — POTASSIUM CHLORIDE 10 MEQ/100ML IV SOLN
10.0000 meq | INTRAVENOUS | Status: AC
  Administered 2019-05-18: 10 meq via INTRAVENOUS
  Filled 2019-05-18: qty 100

## 2019-05-18 MED ORDER — PIPERACILLIN-TAZOBACTAM 3.375 G IVPB
3.3750 g | Freq: Three times a day (TID) | INTRAVENOUS | Status: DC
  Administered 2019-05-18 – 2019-05-23 (×15): 3.375 g via INTRAVENOUS
  Filled 2019-05-18 (×11): qty 50

## 2019-05-18 MED ORDER — INSULIN ASPART 100 UNIT/ML ~~LOC~~ SOLN
0.0000 [IU] | Freq: Three times a day (TID) | SUBCUTANEOUS | Status: DC
  Administered 2019-05-21: 2 [IU] via SUBCUTANEOUS
  Administered 2019-05-21: 7 [IU] via SUBCUTANEOUS
  Administered 2019-05-21: 3 [IU] via SUBCUTANEOUS
  Administered 2019-05-22: 1 [IU] via SUBCUTANEOUS
  Administered 2019-05-22: 2 [IU] via SUBCUTANEOUS
  Administered 2019-05-23: 1 [IU] via SUBCUTANEOUS

## 2019-05-18 NOTE — Evaluation (Signed)
[facility] arrived to unit via stretcher with [facility] transport team. Transfer uneventful. Patient in bed, in NAD, vitals WDL. Admission team notified.

## 2019-05-18 NOTE — ED Provider Notes (Signed)
[facility] EMERGENCY DEPARTMENT Provider Note   CSN: [id number] Arrival date & time: 05/18/19  1829     History Chief Complaint  Patient presents with  . Aphasia    Debra [name] is a 86 y.o. female.  Patient presents to the hospital with a history of slurred speech for 30 minutes x 2.  Patient is back to normal now.  The history is provided by the patient. No language interpreter was used.  Weakness Severity:  Moderate Onset quality:  Sudden Timing:  Intermittent Progression:  Resolved Chronicity:  New Context: not alcohol use   Relieved by:  Nothing Worsened by:  Nothing Ineffective treatments:  None tried Associated symptoms: no abdominal pain, no chest pain, no cough, no diarrhea, no frequency, no headaches and no seizures   Risk factors: no anemia        Past Medical History:  Diagnosis Date  . CKD (chronic kidney disease)   . Diabetes mellitus without complication ([place])   . Gout   . Hypercholesteremia   . Hypertension     Patient Active Problem List   Diagnosis Date Noted  . Poor appetite 04/13/2019  . Dilation of biliary tract 04/13/2019  . Abnormal CT scan, gallbladder 04/13/2019  . Abnormal weight loss 04/13/2019    Past Surgical History:  Procedure Laterality Date  . TOTAL KNEE ARTHROPLASTY Left      OB History    Gravida  5   Para  5   Term  5   Preterm      AB      Living        SAB      TAB      Ectopic      Multiple      Live Births              Family History  Problem Relation Age of Onset  . Diabetes Mother   . Hypertension Father   . Breast cancer Niece   . Colon cancer Neg Hx     Social History   Tobacco Use  . Smoking status: Never Smoker  . Smokeless tobacco: Never Used  Substance Use Topics  . Alcohol use: No  . Drug use: No    Home Medications Prior to Admission medications   Medication Sig Start Date End Date Taking? Authorizing Provider  amLODipine (NORVASC) 10 MG tablet Take 10 mg  by mouth daily.   Yes [provider]  ammonium lactate (AMLACTIN) 12 % cream Apply 1 application topically 2 (two) times daily as needed. 01/26/19  Yes [provider]  aspirin EC 81 MG tablet Take 81 mg by mouth daily.   Yes [provider]  atorvastatin (LIPITOR) 80 MG tablet Take 80 mg by mouth daily at 6 PM.   Yes [provider]  colchicine 0.6 MG tablet Take 0.6 mg by mouth daily as needed.    Yes [provider]  docusate sodium (COLACE) 100 MG capsule Take 100 mg by mouth daily.   Yes [provider]  hydrochlorothiazide (HYDRODIURIL) 25 MG tablet Take 25 mg by mouth daily. 05/14/19  Yes [provider]  labetalol (NORMODYNE) 100 MG tablet Take 100 mg by mouth 2 (two) times daily.   Yes [provider]  latanoprost (XALATAN) 0.005 % ophthalmic solution Place 1 drop into both eyes at bedtime.    Yes [provider]  losartan (COZAAR) 100 MG tablet Take 100 mg by mouth daily. 05/14/19  Yes  [provider]  meclizine (ANTIVERT) 25 MG tablet Take 25 mg by mouth 3 (three) times daily as needed for dizziness.   Yes [provider]  metFORMIN (GLUCOPHAGE-XR) 500 MG 24 hr tablet Take 500 mg by mouth 2 (two) times daily.   Yes [provider]  triamcinolone (KENALOG) 0.025 % cream Apply 1 application topically 2 (two) times daily as needed. 03/23/19  Yes [provider]    Allergies    Other  Review of Systems   Review of Systems  Constitutional: Negative for appetite change and fatigue.  HENT: Negative for congestion, ear discharge and sinus pressure.   Eyes: Negative for discharge.  Respiratory: Negative for cough.   Cardiovascular: Negative for chest pain.  Gastrointestinal: Negative for abdominal pain and diarrhea.  Genitourinary: Negative for frequency and hematuria.  Musculoskeletal: Negative for back pain.  Skin: Negative for rash.  Neurological: Positive for weakness.  Negative for seizures and headaches.       Slurred speech  Psychiatric/Behavioral: Negative for hallucinations.    Physical Exam Updated Vital Signs BP (!) 90/58   Pulse (!) 105   Resp (!) 29   Ht 5\' 7"  (1.702 m)   Wt 65.8 kg   SpO2 100%   BMI 22.71 kg/m   Physical Exam Vitals and nursing note reviewed.  Constitutional:      Appearance: She is well-developed.  HENT:     Head: Normocephalic.     Nose: Nose normal.  Eyes:     General: No scleral icterus.    Conjunctiva/sclera: Conjunctivae normal.     Comments: Sclera icteric  Neck:     Thyroid: No thyromegaly.  Cardiovascular:     Rate and Rhythm: Normal rate and regular rhythm.     Heart sounds: No murmur. No friction rub. No gallop.   Pulmonary:     Breath sounds: No stridor. No wheezing or rales.  Chest:     Chest wall: No tenderness.  Abdominal:     General: There is no distension.     Tenderness: There is no abdominal tenderness. There is no rebound.  Musculoskeletal:        General: Normal range of motion.     Cervical back: Neck supple.  Lymphadenopathy:     Cervical: No cervical adenopathy.  Skin:    Findings: No erythema or rash.  Neurological:     Mental Status: She is alert and oriented to person, place, and time.     Motor: No abnormal muscle tone.     Coordination: Coordination normal.  Psychiatric:        Behavior: Behavior normal.     ED Results / Procedures / Treatments   Labs (all labs ordered are listed, but only abnormal results are displayed) Labs Reviewed  CBC WITH DIFFERENTIAL/PLATELET - Abnormal; Notable for the following components:      Result Value   RBC 2.82 (*)    Hemoglobin 8.6 (*)    HCT 26.1 (*)    Platelets 499 (*)    All other components within normal limits  COMPREHENSIVE METABOLIC PANEL - Abnormal; Notable for the following components:   Sodium 131 (*)    Potassium 3.1 (*)    Chloride 95 (*)    Glucose, Bld 209 (*)    BUN 25 (*)    Creatinine, Ser 1.49 (*)     Calcium 8.0 (*)    Total Protein 5.7 (*)    Albumin 1.7 (*)    AST 478 (*)  ALT 175 (*)    Alkaline Phosphatase 652 (*)    Total Bilirubin 6.9 (*)    GFR calc non Af Amer 31 (*)    GFR calc Af Amer 36 (*)    All other components within normal limits  PROTIME-INR - Abnormal; Notable for the following components:   Prothrombin Time 15.3 (*)    INR 1.3 (*)    All other components within normal limits  LACTIC ACID, PLASMA - Abnormal; Notable for the following components:   Lactic Acid, Venous 4.5 (*)    All other components within normal limits  LACTIC ACID, PLASMA - Abnormal; Notable for the following components:   Lactic Acid, Venous 4.2 (*)    All other components within normal limits  URINALYSIS, ROUTINE W REFLEX MICROSCOPIC - Abnormal; Notable for the following components:   Color, Urine AMBER (*)    APPearance HAZY (*)    Hgb urine dipstick LARGE (*)    Bilirubin Urine SMALL (*)    Protein, ur 30 (*)    Bacteria, UA RARE (*)    All other components within normal limits  LIPASE, BLOOD - Abnormal; Notable for the following components:   Lipase 1,132 (*)    All other components within normal limits  TROPONIN I (HIGH SENSITIVITY) - Abnormal; Notable for the following components:   Troponin I (High Sensitivity) 30 (*)    All other components within normal limits  TROPONIN I (HIGH SENSITIVITY) - Abnormal; Notable for the following components:   Troponin I (High Sensitivity) 30 (*)    All other components within normal limits  RESPIRATORY PANEL BY RT PCR (FLU A&B, COVID)  URINE CULTURE  OCCULT BLOOD X 1 CARD TO LAB, STOOL  POC OCCULT BLOOD, ED  TYPE AND SCREEN    EKG None  Radiology CT Head Wo Contrast  Result Date: 05/18/2019 CLINICAL DATA:  Ataxia with stroke suspected EXAM: CT HEAD WITHOUT CONTRAST TECHNIQUE: Contiguous axial images were obtained from the base of the skull through the vertex without intravenous contrast. COMPARISON:  02/27/2019 FINDINGS: Brain: No  evidence of acute infarction, hemorrhage, hydrocephalus, extra-axial collection or mass lesion/mass effect. Chronic small vessel ischemia in the periventricular white matter. Cerebral volume loss with ventriculomegaly Vascular: No hyperdense vessel or unexpected calcification. Skull: Normal. Negative for fracture or focal lesion. Sinuses/Orbits: No acute finding. IMPRESSION: 1. No acute finding. 2. Atrophy and chronic small vessel ischemia. Electronically Signed   By: Monte Fantasia M.D.   On: 05/18/2019 10:37   CT ABDOMEN PELVIS W CONTRAST  Result Date: 05/18/2019 CLINICAL DATA:  Abdominal pain, neutropenia, elevated LFTs EXAM: CT ABDOMEN AND PELVIS WITH CONTRAST TECHNIQUE: Multidetector CT imaging of the abdomen and pelvis was performed using the standard protocol following bolus administration of intravenous contrast. CONTRAST:  80mL OMNIPAQUE IOHEXOL 300 MG/ML  SOLN COMPARISON:  03/20/2019 FINDINGS: Lower chest: No acute abnormality. Three-vessel coronary artery calcifications. Hepatobiliary: Redemonstrated profound intra and extrahepatic biliary ductal dilatation to the ampulla, which is increased compared prior examination, the common bile duct measuring up to 1.6 cm. The gallbladder is distended and contains numerous tiny gallstones. Pancreas: The pancreatic duct is profoundly dilated along its length to the ampulla, measuring up to 1.4 cm centrally. Spleen: Normal in size without significant abnormality. Adrenals/Urinary Tract: Adrenal glands are unremarkable. Kidneys are normal, without renal calculi, solid lesion, or hydronephrosis. Bladder is unremarkable. Stomach/Bowel: Stomach is within normal limits. Appendix appears normal. No evidence of bowel wall thickening, distention, or inflammatory changes. Vascular/Lymphatic: Aortic atherosclerosis. No  enlarged abdominal or pelvic lymph nodes. Reproductive: No mass or other significant abnormality. Other: No abdominal wall hernia or abnormality. No  abdominopelvic ascites. Musculoskeletal: No acute or significant osseous findings. IMPRESSION: 1. 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 2.  Cholelithiasis. 3.  Coronary artery disease.  Aortic Atherosclerosis (ICD10-I70.0). Electronically Signed   By: Eddie Candle M.D.   On: 05/18/2019 11:15   DG Chest Port 1 View  Result Date: 05/18/2019 CLINICAL DATA:  Weakness and slurred speech. EXAM: PORTABLE CHEST 1 VIEW COMPARISON:  01/28/2017. FINDINGS: Trachea is midline. Heart size normal. Thoracic aorta is calcified. Lungs are clear. No pleural fluid. IMPRESSION: 1. No acute findings. 2.  Aortic atherosclerosis (ICD10-I70.0). Electronically Signed   By: Lorin Picket M.D.   On: 05/18/2019 10:26    Procedures Procedures (including critical care time)  Medications Ordered in ED Medications  potassium chloride 10 mEq in 100 mL IVPB (10 mEq Intravenous New Bag/Given 05/18/19 1226)  sodium chloride 0.9 % bolus 1,000 mL (0 mLs Intravenous Stopped 05/18/19 1019)  cefTRIAXone (ROCEPHIN) 1 g in sodium chloride 0.9 % 100 mL IVPB (0 g Intravenous Stopped 05/18/19 1226)  sodium chloride 0.9 % bolus 1,000 mL (0 mLs Intravenous Stopped 05/18/19 1226)  iohexol (OMNIPAQUE) 300 MG/ML solution 75 mL (75 mLs Intravenous Contrast Given 05/18/19 1051)  sodium chloride 0.9 % bolus 1,000 mL (1,000 mLs Intravenous New Bag/Given 05/18/19 1226)    ED Course  I have reviewed the triage vital signs and the nursing notes.  Pertinent labs & imaging results that were available during my care of the patient were reviewed by me and considered in my medical decision making (see chart for details).    CRITICAL CARE Performed by: Milton Ferguson Total critical care time: 45 minutes Critical care time was  exclusive of separately billable procedures and treating other patients. Critical care was necessary to treat or prevent imminent or life-threatening deterioration. Critical care was time spent personally by me on the following activities: development of treatment plan with patient and/or surrogate as well as nursing, discussions with consultants, evaluation of patient's response to treatment, examination of patient, obtaining history from patient or surrogate, ordering and performing treatments and interventions, ordering and review of laboratory studies, ordering and review of radiographic studies, pulse oximetry and re-evaluation of patient's condition.  MDM Rules/Calculators/A&P                      I spoke with Dr. Merlene Laughter about her slurred speech for 30 minutes.  He states he will see the patient tomorrow and she can add Plavix to her aspirin.  Then the rest of the work-up occurred and it was noted that the patient has elevated bilirubin and CT scan shows possible pancreatic tumor and gallstones.  Patient will be seen by GI and admitted by medicine    This patient presents to the ED for concern of slurred speech, this involves an extensive number of treatment options, and is a complaint that carries with it a high risk of complications and morbidity.  The differential diagnosis includes TIA.  Tumor   Lab Tests:   I Ordered, reviewed, and interpreted labs, which included CBC chemistries liver profiles which showed anemia and severely elevated bilirubin  Medicines ordered:   I ordered medication IV fluids and antibiotics  for possible infection  Imaging Studies ordered:   I ordered imaging studies which included CT of the abdomen and chest x-ray and  I independently visualized and interpreted imaging which showed possible pancreatic tumor and gallstones  Additional history obtained:   Additional history obtained from family member  Previous records obtained and reviewed    Consultations Obtained:   I consulted neurology and gastroenterology and discussed lab and imaging findings  Reevaluation:  After the interventions stated above, I reevaluated the patient and found patient improved with IV fluids  Critical Interventions:  .   Final Clinical Impression(s) / ED Diagnoses Final diagnoses:  Elevated bilirubin  TIA (transient ischemic attack)    Rx / DC Orders ED Discharge Orders    None       Milton Ferguson, MD 05/18/19 1255

## 2019-05-18 NOTE — ED Notes (Signed)
Date and time results received: 05/18/19 10:06 AM  (use smartphrase ".now" to insert current time)  Test: lactic acid Critical Value: 4.5  Name of Provider Notified: Zammit MD  Orders Received? Or Actions Taken?:na

## 2019-05-18 NOTE — Consult Note (Signed)
Referring Provider: ED Physician Primary Care Physician:  Asencion Noble, MD Primary Gastroenterologist:  Dr. Gala Romney  Date of Admission: 05/18/19 Date of Consultation: 05/18/19  Reason for Consultation:  Likely gallstone pancreatitis, CBD dilation  HPI:  CASSIDI MODESITT is a 86 y.o. female with a past medical history of CKD, diabetes, gout, hypercholesterolemia, hypertension.  The patient was seen in our office 04/13/2019 for poor appetite, CBD dilation, abnormal CT scan and abnormal weight loss.  Noted chronic memory issues with possible evolving dementia, oriented x2 at her visit.  In February of this year noted elevation of LFTs and abnormal imaging of her gallbladder showing CBD dilated to 14 mm.  Labs as per below.  A month later, also per below, her LFTs normalized.  There is a question of possible doing MRI/MRCP but family was not sure she would tolerate this given her memory issues.  Labs from 02/18/2019: Glucose 99, creatinine 1.56, BUN 21, albumin 2.9, total bilirubin 0.7, alkaline phosphatase 292, AST 58, ALT 39, white blood cell count 7800.,  Hemoglobin 10.8, MCV 91, A1c 5.8, TSH 4.830, B12 1347  Labs from April 02, 2019: Creatinine 1.23, albumin 3.1, total bilirubin 0.4, alkaline phosphatase 128, AST 23, ALT 12, white blood cell count 7600, hemoglobin 11.6 normal, platelets 496,000.  Estimated GFR of 46  The patient presented to the emergency room for slurred speech x30 minutes.  Requires a question of possible TIA.  Neurology was consulted and recommended adding Plavix to her aspirin.  During admission noted abnormal labs including some worsening anemia with hemoglobin of 8.6 compared to 10.44 years ago, stool card negative.  CMP found stable creatinine/improved at 1.49.  AST/ALT elevated at 478/175, alkaline phosphatase elevated at 652, bilirubin elevated at 6.9.  INR mildly elevated at 1.3.  Lipase elevated 1132.  Lactic acid elevated 4.5.  White count normal.  CT imaging found  persistent profound intra and extrahepatic biliary ductal dilation of the ampulla which is increased with CBD measuring 1.6 cm.  Gallbladder distended with numerous tiny stones.  Pancreatic duct also profoundly dilated along its length of the ampulla measuring 1.4 cm centrally, no overt signs noted of acute pancreatitis.  Today she states she feels good.  Denies any abdominal pain, nausea, vomiting, hematochezia, melena.  No symptoms whatsoever from a GI perspective.  I explained the pathophysiology of cholelithiasis, choledocholithiasis, gallstone pancreatitis.  The patient was accompanied by her son.  Her other son, who is a Arts administrator at [employer], was also on the phone.  At the end of the call the son requested that his mom be transferred to [facility] where he is at.  I notified the emergency room physician who indicated he would notify the attending.  Past Medical History:  Diagnosis Date  . CKD (chronic kidney disease)   . Diabetes mellitus without complication ([place])   . Gout   . Hypercholesteremia   . Hypertension     Past Surgical History:  Procedure Laterality Date  . TOTAL KNEE ARTHROPLASTY Left     Prior to Admission medications   Medication Sig Start Date End Date Taking? Authorizing Provider  amLODipine (NORVASC) 10 MG tablet Take 10 mg by mouth daily.   Yes [provider]  ammonium lactate (AMLACTIN) 12 % cream Apply 1 application topically 2 (two) times daily as needed. 01/26/19  Yes [provider]  aspirin EC 81 MG tablet Take 81 mg by mouth daily.   Yes [provider]  atorvastatin (LIPITOR) 80 MG tablet  Take 80 mg by mouth daily at 6 PM.   Yes [provider]  colchicine 0.6 MG tablet Take 0.6 mg by mouth daily as needed.    Yes [provider]  docusate sodium (COLACE) 100 MG capsule Take 100 mg by mouth daily.   Yes [provider]  hydrochlorothiazide (HYDRODIURIL) 25 MG tablet Take 25 mg by mouth daily.  05/14/19  Yes [provider]  labetalol (NORMODYNE) 100 MG tablet Take 100 mg by mouth 2 (two) times daily.   Yes [provider]  latanoprost (XALATAN) 0.005 % ophthalmic solution Place 1 drop into both eyes at bedtime.    Yes [provider]  losartan (COZAAR) 100 MG tablet Take 100 mg by mouth daily. 05/14/19  Yes [provider]  meclizine (ANTIVERT) 25 MG tablet Take 25 mg by mouth 3 (three) times daily as needed for dizziness.   Yes [provider]  metFORMIN (GLUCOPHAGE-XR) 500 MG 24 hr tablet Take 500 mg by mouth 2 (two) times daily.   Yes [provider]  triamcinolone (KENALOG) 0.025 % cream Apply 1 application topically 2 (two) times daily as needed. 03/23/19  Yes [provider]    No current facility-administered medications for this encounter.   Current Outpatient Medications  Medication Sig Dispense Refill  . amLODipine (NORVASC) 10 MG tablet Take 10 mg by mouth daily.    Marland Kitchen ammonium lactate (AMLACTIN) 12 % cream Apply 1 application topically 2 (two) times daily as needed.    Marland Kitchen aspirin EC 81 MG tablet Take 81 mg by mouth daily.    Marland Kitchen atorvastatin (LIPITOR) 80 MG tablet Take 80 mg by mouth daily at 6 PM.    . colchicine 0.6 MG tablet Take 0.6 mg by mouth daily as needed.     . docusate sodium (COLACE) 100 MG capsule Take 100 mg by mouth daily.    . hydrochlorothiazide (HYDRODIURIL) 25 MG tablet Take 25 mg by mouth daily.    Marland Kitchen labetalol (NORMODYNE) 100 MG tablet Take 100 mg by mouth 2 (two) times daily.    Marland Kitchen latanoprost (XALATAN) 0.005 % ophthalmic solution Place 1 drop into both eyes at bedtime.     Marland Kitchen losartan (COZAAR) 100 MG tablet Take 100 mg by mouth daily.    . meclizine (ANTIVERT) 25 MG tablet Take 25 mg by mouth 3 (three) times daily as needed for dizziness.    . metFORMIN (GLUCOPHAGE-XR) 500 MG 24 hr tablet Take 500 mg by mouth 2 (two) times daily.    Marland Kitchen triamcinolone (KENALOG) 0.025 % cream Apply 1 application  topically 2 (two) times daily as needed.      Allergies as of 05/18/2019 - Review Complete 05/18/2019  Allergen Reaction Noted  . Other Nausea And Vomiting 04/27/2015    Family History  Problem Relation Age of Onset  . Diabetes Mother   . Hypertension Father   . Breast cancer Niece   . Colon cancer Neg Hx     Social History   Socioeconomic History  . Marital status: Single    Spouse name: Not on file  . Number of children: Not on file  . Years of education: Not on file  . Highest education level: Not on file  Occupational History  . Not on file  Tobacco Use  . Smoking status: Never Smoker  . Smokeless tobacco: Never Used  Substance and Sexual Activity  . Alcohol use: No  . Drug use: No  . Sexual activity: Not on  file  Other Topics Concern  . Not on file  Social History Narrative  . Not on file   Social Determinants of Health   Financial Resource Strain:   . Difficulty of Paying Living Expenses:   Food Insecurity:   . Worried About Charity fundraiser in the Last Year:   . Arboriculturist in the Last Year:   Transportation Needs:   . Film/video editor (Medical):   Marland Kitchen Lack of Transportation (Non-Medical):   Physical Activity:   . Days of Exercise per Week:   . Minutes of Exercise per Session:   Stress:   . Feeling of Stress :   Social Connections:   . Frequency of Communication with Friends and Family:   . Frequency of Social Gatherings with Friends and Family:   . Attends Religious Services:   . Active Member of Clubs or Organizations:   . Attends Archivist Meetings:   Marland Kitchen Marital Status:   Intimate Partner Violence:   . Fear of Current or Ex-Partner:   . Emotionally Abused:   Marland Kitchen Physically Abused:   . Sexually Abused:     Review of Systems: General: Negative for anorexia, weight loss, fever, chills, fatigue, weakness. ENT: Negative for hoarseness, difficulty swallowing , nasal congestion. CV: Negative for chest pain, angina,  palpitations, dyspnea on exertion, peripheral edema.  Respiratory: Negative for dyspnea at rest, dyspnea on exertion, cough, sputum, wheezing.  GI: See history of present illness. Derm: Negative for rash or itching.  Neuro: Positive for memory loss (per family).  Heme: Negative for bruising or bleeding. Allergy: Negative for rash or hives.  Physical Exam: Vital signs in last 24 hours: Pulse Rate:  [102-109] 103 (05/03 1230) Resp:  [21-32] 29 (05/03 1030) BP: (88-123)/(51-92) 99/51 (05/03 1230) SpO2:  [94 %-100 %] 100 % (05/03 1230) Weight:  [65.8 kg] 65.8 kg (05/03 0903)   General:   Alert,  Well-developed, well-nourished, pleasant and cooperative in NAD Head:  Normocephalic and atraumatic. Eyes:  Scleral icterus. Ears:  Normal auditory acuity. Neck:  Supple; no masses or thyromegaly. Lungs:  Clear throughout to auscultation.   No wheezes, crackles, or rhonchi. No acute distress. Heart:  Regular rate and rhythm; no murmurs, clicks, rubs,  or gallops. Abdomen:  Soft, nontender and nondistended. No masses, hepatosplenomegaly or hernias noted. Normal bowel sounds, without guarding, and without rebound.   Rectal:  Deferred.   Msk:  Symmetrical without gross deformities. Pulses:  Normal DP pulses noted. Extremities:  Without clubbing or edema. Neurologic:  Alert and  oriented;  grossly normal neurologically. Psych:  Alert and cooperative. Normal mood and affect.  Intake/Output from previous day: No intake/output data recorded. Intake/Output this shift: Total I/O In: 2098 [IV Piggyback:2098] Out: -   Lab Results: Recent Labs    05/18/19 0941  WBC 8.2  HGB 8.6*  HCT 26.1*  PLT 499*   BMET Recent Labs    05/18/19 0941  NA 131*  K 3.1*  CL 95*  CO2 23  GLUCOSE 209*  BUN 25*  CREATININE 1.49*  CALCIUM 8.0*   LFT Recent Labs    05/18/19 0941  PROT 5.7*  ALBUMIN 1.7*  AST 478*  ALT 175*  ALKPHOS 652*  BILITOT 6.9*   PT/INR Recent Labs    05/18/19 0941   LABPROT 15.3*  INR 1.3*   Hepatitis Panel No results for input(s): HEPBSAG, HCVAB, HEPAIGM, HEPBIGM in the last 72 hours. C-Diff No results for input(s): CDIFFTOX in the  last 72 hours.  Studies/Results: CT Head Wo Contrast  Result Date: 05/18/2019 CLINICAL DATA:  Ataxia with stroke suspected EXAM: CT HEAD WITHOUT CONTRAST TECHNIQUE: Contiguous axial images were obtained from the base of the skull through the vertex without intravenous contrast. COMPARISON:  02/27/2019 FINDINGS: Brain: No evidence of acute infarction, hemorrhage, hydrocephalus, extra-axial collection or mass lesion/mass effect. Chronic small vessel ischemia in the periventricular white matter. Cerebral volume loss with ventriculomegaly Vascular: No hyperdense vessel or unexpected calcification. Skull: Normal. Negative for fracture or focal lesion. Sinuses/Orbits: No acute finding. IMPRESSION: 1. No acute finding. 2. Atrophy and chronic small vessel ischemia. Electronically Signed   By: Monte Fantasia M.D.   On: 05/18/2019 10:37   CT ABDOMEN PELVIS W CONTRAST  Result Date: 05/18/2019 CLINICAL DATA:  Abdominal pain, neutropenia, elevated LFTs EXAM: CT ABDOMEN AND PELVIS WITH CONTRAST TECHNIQUE: Multidetector CT imaging of the abdomen and pelvis was performed using the standard protocol following bolus administration of intravenous contrast. CONTRAST:  44mL OMNIPAQUE IOHEXOL 300 MG/ML  SOLN COMPARISON:  03/20/2019 FINDINGS: Lower chest: No acute abnormality. Three-vessel coronary artery calcifications. Hepatobiliary: Redemonstrated profound intra and extrahepatic biliary ductal dilatation to the ampulla, which is increased compared prior examination, the common bile duct measuring up to 1.6 cm. The gallbladder is distended and contains numerous tiny gallstones. Pancreas: The pancreatic duct is profoundly dilated along its length to the ampulla, measuring up to 1.4 cm centrally. Spleen: Normal in size without significant abnormality.  Adrenals/Urinary Tract: Adrenal glands are unremarkable. Kidneys are normal, without renal calculi, solid lesion, or hydronephrosis. Bladder is unremarkable. Stomach/Bowel: Stomach is within normal limits. Appendix appears normal. No evidence of bowel wall thickening, distention, or inflammatory changes. Vascular/Lymphatic: Aortic atherosclerosis. No enlarged abdominal or pelvic lymph nodes. Reproductive: No mass or other significant abnormality. Other: No abdominal wall hernia or abnormality. No abdominopelvic ascites. Musculoskeletal: No acute or significant osseous findings. IMPRESSION: 1. 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 2.  Cholelithiasis. 3.  Coronary artery disease.  Aortic Atherosclerosis (ICD10-I70.0). Electronically Signed   By: Eddie Candle M.D.   On: 05/18/2019 11:15   DG Chest Port 1 View  Result Date: 05/18/2019 CLINICAL DATA:  Weakness and slurred speech. EXAM: PORTABLE CHEST 1 VIEW COMPARISON:  01/28/2017. FINDINGS: Trachea is midline. Heart size normal. Thoracic aorta is calcified. Lungs are clear. No pleural fluid. IMPRESSION: 1. No acute findings. 2.  Aortic atherosclerosis (ICD10-I70.0). Electronically Signed   By: Lorin Picket M.D.   On: 05/18/2019 10:26    Impression: Very pleasant 86 year old female who was seen in our office for elevated LFTs and abnormal CT imaging of the biliary system with CBD dilation of 14 mm.  She presents today with slurred speech, which appears to have resolved.  However, it was noted she had again significant transaminitis with AST/ALT at 458/175, bilirubin elevated at 6.9 (associated/noted scleral icterus), elevated alkaline phosphatase in the 650 range.  Repeat CT imaging shows progressive CBD dilation at 1.6 cm and  pancreatic ductal dilation at 1.4 cm.  The patient is generally asymptomatic from a GI standpoint.  Elevated lactic acid at 4.5, but normal white count.  Abnormal LFTs/CT Imaging- likely choledocholithiasis/gallstone pancreatitis.  Her gallbladder does have numerous stones.  She had a similar presentation a couple months ago with spontaneous resolution likely due to a passed stone.  Discussed with Dr. Laural Golden who recommended consideration of ERCP tomorrow based on overnight clinical progression.  At this point no obvious signs of acute cholangitis.  Plan: 1. Transfer to [facility] per family request 2. If the patient does remain here we would be happy to continue to see her 3. Supportive measures in the meantime.   Thank you for allowing [place] to participate in the care of [place], DNP, AGNP-C Adult & Gerontological Nurse Practitioner [facility]   LOS: 0 days     05/18/2019, 1:46 PM

## 2019-05-18 NOTE — H&P (Signed)
History and Physical    Debra [name] DOB: [birth year] DOA: 05/18/2019  PCP: Asencion Noble, MD  Patient coming from: Home  I have personally briefly reviewed patient's old medical records in [place]  Chief Complaint: Slurred speech  HPI: Debra [name] is a 86 y.o. female with medical history significant of hypertension, diabetes, hyperlipidemia who lives at home with her son.  The patient normally ambulates with the help of a cane.  Her son reports that patient woke up this morning in her usual state of health.  She ate her breakfast and took her medications.  Subsequently, he noted that patient was having slurred speech.  He noticed that she had some left-sided facial drooping at her mouth.  She was brought to the hospital for evaluation and by the time of her arrival, her symptoms had resolved.  Symptoms reportedly lasted for 30 minutes.  She did not have any limb weakness or numbness.  She did not have any changes in her vision.  Her son also noticed development of yellow discoloration of her sclera since this morning.  Patient denies any abdominal pain.  She has had no nausea or vomiting.  No fevers.  Her son reports that she has had unintentional weight loss of 30 pounds in the past year.  Her appetite has been good.  ED Course: In the emergency room, initial CT head was found to be unremarkable.  Further work-up with lab work showed bilirubin of 6.9, lipase of 1132, AST 478 ALT 175, lactic acid of 4.5, hemoglobin of 8.6, potassium of 3.1.  CT abdomen showed intra and extrahepatic biliary ductal dilatation in addition to dilation of the pancreatic duct.  Highly concerning for a small ampullary or pancreatic malignancy which is not directly visualized.  There was also cholelithiasis.  Hospitalist service was requested to admit the patient for further work-up.  Family is requesting transfer to [facility] for further evaluation.  Review of Systems: As per HPI otherwise  10 point review of systems negative.    Past Medical History:  Diagnosis Date  . CKD (chronic kidney disease)   . Diabetes mellitus without complication ([place])   . Gout   . Hypercholesteremia   . Hypertension     Past Surgical History:  Procedure Laterality Date  . TOTAL KNEE ARTHROPLASTY Left     Social History:  reports that she has never smoked. She has never used smokeless tobacco. She reports that she does not drink alcohol or use drugs.  Allergies  Allergen Reactions  . Other Nausea And Vomiting    Diet Sodas. Sugar-free jello    Family History  Problem Relation Age of Onset  . Diabetes Mother   . Hypertension Father   . Breast cancer Niece   . Colon cancer Neg Hx     Prior to Admission medications   Medication Sig Start Date End Date Taking? Authorizing Provider  amLODipine (NORVASC) 10 MG tablet Take 10 mg by mouth daily.   Yes [provider]  ammonium lactate (AMLACTIN) 12 % cream Apply 1 application topically 2 (two) times daily as needed. 01/26/19  Yes [provider]  aspirin EC 81 MG tablet Take 81 mg by mouth daily.   Yes [provider]  atorvastatin (LIPITOR) 80 MG tablet Take 80 mg by mouth daily at 6 PM.   Yes [provider]  colchicine 0.6 MG tablet Take 0.6 mg by mouth daily as needed.    Yes [provider]  docusate sodium (COLACE) 100 MG capsule Take 100 mg by mouth daily.   Yes [provider]  hydrochlorothiazide (HYDRODIURIL) 25 MG tablet Take 25 mg by mouth daily. 05/14/19  Yes [provider]  labetalol (NORMODYNE) 100 MG tablet Take 100 mg by mouth 2 (two) times daily.   Yes [provider]  latanoprost (XALATAN) 0.005 % ophthalmic solution Place 1 drop into both eyes at bedtime.    Yes [provider]  losartan (COZAAR) 100 MG tablet Take 100 mg by mouth daily. 05/14/19  Yes [provider]  meclizine (ANTIVERT) 25 MG tablet Take 25 mg by mouth 3 (three)  times daily as needed for dizziness.   Yes [provider]  metFORMIN (GLUCOPHAGE-XR) 500 MG 24 hr tablet Take 500 mg by mouth 2 (two) times daily.   Yes [provider]  triamcinolone (KENALOG) 0.025 % cream Apply 1 application topically 2 (two) times daily as needed. 03/23/19  Yes [provider]    Physical Exam: Vitals:   05/18/19 1230 05/18/19 1400 05/18/19 1430 05/18/19 1600  BP: (!) 99/51 111/62 103/67 (!) 103/51  Pulse: (!) 103 (!) 104 (!) 101 90  Resp:  (!) 27 (!) 27 (!) 25  SpO2: 100% 97% 100% 100%  Weight:      Height:        Constitutional: NAD, calm, comfortable Eyes: PERRL, lids and conjunctivae normal ENMT: Mucous membranes are moist. Posterior pharynx clear of any exudate or lesions.Normal dentition.  Neck: normal, supple, no masses, no thyromegaly Respiratory: clear to auscultation bilaterally, no wheezing, no crackles. Normal respiratory effort. No accessory muscle use.  Cardiovascular: Regular rate and rhythm, no murmurs / rubs / gallops. No extremity edema. 2+ pedal pulses. No carotid bruits.  Abdomen: no tenderness, no masses palpated. No hepatosplenomegaly. Bowel sounds positive.  Musculoskeletal: no clubbing / cyanosis. No joint deformity upper and lower extremities. Good ROM, no contractures. Normal muscle tone.  Skin: no rashes, lesions, ulcers. No induration Neurologic: CN 2-12 grossly intact. Sensation intact, DTR normal. Strength 5/5 in all 4.  Psychiatric: Normal judgment and insight. Alert and oriented x 3. Normal mood.    Labs on Admission: I have personally reviewed following labs and imaging studies  CBC: Recent Labs  Lab 05/18/19 0941  WBC 8.2  NEUTROABS 6.1  HGB 8.6*  HCT 26.1*  MCV 92.6  PLT 229*   Basic Metabolic Panel: Recent Labs  Lab 05/18/19 0941  NA 131*  K 3.1*  CL 95*  CO2 23  GLUCOSE 209*  BUN 25*  CREATININE 1.49*  CALCIUM 8.0*  MG 1.7   GFR: Estimated Creatinine Clearance: 25.9 mL/min (A)  (by C-G formula based on SCr of 1.49 mg/dL (H)). Liver Function Tests: Recent Labs  Lab 05/18/19 0941  AST 478*  ALT 175*  ALKPHOS 652*  BILITOT 6.9*  PROT 5.7*  ALBUMIN 1.7*   Recent Labs  Lab 05/18/19 0941  LIPASE 1,132*   No results for input(s): AMMONIA in the last 168 hours. Coagulation Profile: Recent Labs  Lab 05/18/19 0941  INR 1.3*   Cardiac Enzymes: No results for input(s): CKTOTAL, CKMB, CKMBINDEX, TROPONINI in the last 168 hours. BNP (last 3 results) No results for input(s): PROBNP in the last 8760 hours. HbA1C: No results for input(s): HGBA1C in the last 72 hours. CBG: No results for input(s): GLUCAP in the last 168 hours. Lipid Profile: No results for input(s): CHOL, HDL, LDLCALC, TRIG, CHOLHDL, LDLDIRECT in the last 72 hours. Thyroid Function Tests:  No results for input(s): TSH, T4TOTAL, FREET4, T3FREE, THYROIDAB in the last 72 hours. Anemia Panel: No results for input(s): VITAMINB12, FOLATE, FERRITIN, TIBC, IRON, RETICCTPCT in the last 72 hours. Urine analysis:    Component Value Date/Time   COLORURINE AMBER (A) 05/18/2019 1046   APPEARANCEUR HAZY (A) 05/18/2019 1046   LABSPEC 1.012 05/18/2019 1046   PHURINE 5.0 05/18/2019 1046   GLUCOSEU NEGATIVE 05/18/2019 1046   HGBUR LARGE (A) 05/18/2019 1046   BILIRUBINUR SMALL (A) 05/18/2019 1046   KETONESUR NEGATIVE 05/18/2019 1046   PROTEINUR 30 (A) 05/18/2019 1046   NITRITE NEGATIVE 05/18/2019 1046   LEUKOCYTESUR NEGATIVE 05/18/2019 1046    Radiological Exams on Admission: CT Head Wo Contrast  Result Date: 05/18/2019 CLINICAL DATA:  Ataxia with stroke suspected EXAM: CT HEAD WITHOUT CONTRAST TECHNIQUE: Contiguous axial images were obtained from the base of the skull through the vertex without intravenous contrast. COMPARISON:  02/27/2019 FINDINGS: Brain: No evidence of acute infarction, hemorrhage, hydrocephalus, extra-axial collection or mass lesion/mass effect. Chronic small vessel ischemia in the  periventricular white matter. Cerebral volume loss with ventriculomegaly Vascular: No hyperdense vessel or unexpected calcification. Skull: Normal. Negative for fracture or focal lesion. Sinuses/Orbits: No acute finding. IMPRESSION: 1. No acute finding. 2. Atrophy and chronic small vessel ischemia. Electronically Signed   By: Monte Fantasia M.D.   On: 05/18/2019 10:37   CT ABDOMEN PELVIS W CONTRAST  Result Date: 05/18/2019 CLINICAL DATA:  Abdominal pain, neutropenia, elevated LFTs EXAM: CT ABDOMEN AND PELVIS WITH CONTRAST TECHNIQUE: Multidetector CT imaging of the abdomen and pelvis was performed using the standard protocol following bolus administration of intravenous contrast. CONTRAST:  52mL OMNIPAQUE IOHEXOL 300 MG/ML  SOLN COMPARISON:  03/20/2019 FINDINGS: Lower chest: No acute abnormality. Three-vessel coronary artery calcifications. Hepatobiliary: Redemonstrated profound intra and extrahepatic biliary ductal dilatation to the ampulla, which is increased compared prior examination, the common bile duct measuring up to 1.6 cm. The gallbladder is distended and contains numerous tiny gallstones. Pancreas: The pancreatic duct is profoundly dilated along its length to the ampulla, measuring up to 1.4 cm centrally. Spleen: Normal in size without significant abnormality. Adrenals/Urinary Tract: Adrenal glands are unremarkable. Kidneys are normal, without renal calculi, solid lesion, or hydronephrosis. Bladder is unremarkable. Stomach/Bowel: Stomach is within normal limits. Appendix appears normal. No evidence of bowel wall thickening, distention, or inflammatory changes. Vascular/Lymphatic: Aortic atherosclerosis. No enlarged abdominal or pelvic lymph nodes. Reproductive: No mass or other significant abnormality. Other: No abdominal wall hernia or abnormality. No abdominopelvic ascites. Musculoskeletal: No acute or significant osseous findings. IMPRESSION: 1. Profound intra and extrahepatic biliary ductal  dilatation in addition to dilatation of the pancreatic duct, slightly increased compared to prior examination dated 03/20/2019 and highly concerning for a small ampullary or pancreatic malignancy which is not directly visualized. 2.  Cholelithiasis. 3.  Coronary artery disease.  Aortic Atherosclerosis (ICD10-I70.0). Electronically Signed   By: Eddie Candle M.D.   On: 05/18/2019 11:15   DG Chest Port 1 View  Result Date: 05/18/2019 CLINICAL DATA:  Weakness and slurred speech. EXAM: PORTABLE CHEST 1 VIEW COMPARISON:  01/28/2017. FINDINGS: Trachea is midline. Heart size normal. Thoracic aorta is calcified. Lungs are clear. No pleural fluid. IMPRESSION: 1. No acute findings. 2.  Aortic atherosclerosis (ICD10-I70.0). Electronically Signed   By: Lorin Picket M.D.   On: 05/18/2019 10:26    EKG: Independently reviewed.  Sinus tachycardia  Assessment/Plan Active Problems:   Dilation of biliary tract   Gallstone pancreatitis   Pancreatic duct dilated  Lactic acidosis   Acute cholangitis   Unintentional weight loss   Slurred speech   HLD (hyperlipidemia)   HTN (hypertension)   DM (diabetes mellitus), type 2 (HCC)   Hypokalemia   TIA (transient ischemic attack)     1. Obstructive jaundice.  Patient does have cholelithiasis and may have some degree of choledocholithiasis leading to obstructive jaundice.  With painless jaundice and weight loss, certainly underlying malignancy would be of concern.  She would likely need further work-up with MRCP/ERCP.  I have reviewed her care with [facility] who will see the patient on transfer.  We will keep the patient on clear liquids and n.p.o. after midnight.  We will start on antibiotic coverage with intravenous Zosyn for now. 2. Pancreatitis.  She does not have any pain, nausea or vomiting.  Lipase is in the thousands.  Continue on clear liquids for now. 3. Lactic acidosis.  He does not appear septic or toxic.  Blood pressure was low on admission which may be  related to some degree of dehydration.  She was also taking Metformin prior to admission which was on hold.  Lactic acid trending down with hydration. 4. TIA.  With patient's transient slurred speech and left-sided mouth drooping, there was concern for underlying TIA.  Case was reviewed with neurology, Dr. Merlene Laughter who recommended that further imaging with MRI/MRA should be obtained.  The patient is already on aspirin and Plavix can be added to her antiplatelet regimen once no further procedures are planned.  We will hold statin for now in light of elevated LFTs. 5. Diabetes.  Hold Metformin.  Start on sliding scale insulin. 6. Hypokalemia.  Replace.  Check magnesium. 7. Hyperlipidemia.  Holding statin in light of elevated LFTs. 8. Hypertension.  She is on several antihypertensives.  Blood pressures are running low.  Will hold for now.  DVT prophylaxis: heparin  Code Status: full code  Family Communication: discussed with son Herbie Baltimore at the bedside  Disposition Plan: Transfer to [facility] for further work up  [employer] called: Financial [facility] GI  Admission status: inpatient, progressive   Kathie Dike MD [facility]   If 7PM-7AM, please contact night-coverage [email or web address]   05/18/2019, 6:18 PM

## 2019-05-18 NOTE — Progress Notes (Signed)
Pharmacy Antibiotic Note  Debra [name] is a 86 y.o. female admitted on 05/18/2019 with elevated LFTs, increased LA 4.5, wbc wnl, Cr 1.5, afebrile possible pancreatic mass.  Pharmacy has been consulted for Zosyn  dosing.  Plan: Zosyn 3.375 gm q8h EI Monitor renal function for any dosing changes   Height: 5\' 7"  (170.2 cm) Weight: 65.8 kg (145 lb) IBW/kg (Calculated) : 61.6  Temp (24hrs), Avg:98.7 F (37.1 C), Min:98.7 F (37.1 C), Max:98.7 F (37.1 C)  Recent Labs  Lab 05/18/19 0941 05/18/19 1136 05/18/19 1658  WBC 8.2  --   --   CREATININE 1.49*  --   --   LATICACIDVEN 4.5* 4.2* 3.0*    Estimated Creatinine Clearance: 25.9 mL/min (A) (by C-G formula based on SCr of 1.49 mg/dL (H)).    Allergies  Allergen Reactions  . Other Nausea And Vomiting    Diet Sodas. Sugar-free jello    Antimicrobials this admission:  Dose adjustments this admission:  Microbiology results:    Bonnita Nasuti Pharm.D. CPP, BCPS Clinical Pharmacist [phone] 05/18/2019 8:52 PM

## 2019-05-18 NOTE — ED Triage Notes (Addendum)
Pt back at baseline at this time. Per pt son, pt woke up at 630 am, went to bed at 10pm. Pt woke up fine and reports approximately 40 minutes ago, pt began having intermittent episodes of slurred speech.   Pt denies any dizziness, unilateral weakness, vision changes or speech difficulties at this time. Pt alert and oriented. Pt reports usually uses cane or walker at home. Facial symmetry noted. Speech clear.   EDP at bedside and stated would order tele neuro consult regarding possible TIA.

## 2019-05-19 ENCOUNTER — Inpatient Hospital Stay (HOSPITAL_COMMUNITY): Payer: Medicare Other

## 2019-05-19 ENCOUNTER — Encounter (HOSPITAL_COMMUNITY): Payer: Medicare Other

## 2019-05-19 DIAGNOSIS — R748 Abnormal levels of other serum enzymes: Secondary | ICD-10-CM

## 2019-05-19 DIAGNOSIS — R634 Abnormal weight loss: Secondary | ICD-10-CM

## 2019-05-19 DIAGNOSIS — G459 Transient cerebral ischemic attack, unspecified: Secondary | ICD-10-CM | POA: Diagnosis not present

## 2019-05-19 DIAGNOSIS — R4781 Slurred speech: Secondary | ICD-10-CM

## 2019-05-19 DIAGNOSIS — R17 Unspecified jaundice: Secondary | ICD-10-CM | POA: Diagnosis not present

## 2019-05-19 DIAGNOSIS — I639 Cerebral infarction, unspecified: Secondary | ICD-10-CM

## 2019-05-19 DIAGNOSIS — D649 Anemia, unspecified: Secondary | ICD-10-CM

## 2019-05-19 DIAGNOSIS — R5381 Other malaise: Secondary | ICD-10-CM

## 2019-05-19 DIAGNOSIS — R7989 Other specified abnormal findings of blood chemistry: Secondary | ICD-10-CM

## 2019-05-19 DIAGNOSIS — K838 Other specified diseases of biliary tract: Secondary | ICD-10-CM | POA: Diagnosis not present

## 2019-05-19 DIAGNOSIS — K8689 Other specified diseases of pancreas: Secondary | ICD-10-CM | POA: Diagnosis not present

## 2019-05-19 DIAGNOSIS — R29898 Other symptoms and signs involving the musculoskeletal system: Secondary | ICD-10-CM

## 2019-05-19 DIAGNOSIS — E871 Hypo-osmolality and hyponatremia: Secondary | ICD-10-CM

## 2019-05-19 LAB — CBC
HCT: 28 % — ABNORMAL LOW (ref 36.0–46.0)
HCT: 26.8 % — ABNORMAL LOW (ref 36.0–46.0)
Hemoglobin: 9.5 g/dL — ABNORMAL LOW (ref 12.0–15.0)
Hemoglobin: 9.2 g/dL — ABNORMAL LOW (ref 12.0–15.0)
MCH: 30.6 pg (ref 26.0–34.0)
MCH: 30.8 pg (ref 26.0–34.0)
MCHC: 33.9 g/dL (ref 30.0–36.0)
MCHC: 34.3 g/dL (ref 30.0–36.0)
MCV: 90.3 fL (ref 80.0–100.0)
MCV: 89.6 fL (ref 80.0–100.0)
Platelets: 601 10*3/uL — ABNORMAL HIGH (ref 150–400)
Platelets: 538 10*3/uL — ABNORMAL HIGH (ref 150–400)
RBC: 3.1 MIL/uL — ABNORMAL LOW (ref 3.87–5.11)
RBC: 2.99 MIL/uL — ABNORMAL LOW (ref 3.87–5.11)
RDW: 13.9 % (ref 11.5–15.5)
RDW: 13.8 % (ref 11.5–15.5)
WBC: 8.4 10*3/uL (ref 4.0–10.5)
WBC: 7.9 10*3/uL (ref 4.0–10.5)
nRBC: 0 % (ref 0.0–0.2)
nRBC: 0 % (ref 0.0–0.2)

## 2019-05-19 LAB — COMPREHENSIVE METABOLIC PANEL
ALT: 179 U/L — ABNORMAL HIGH (ref 0–44)
AST: 463 U/L — ABNORMAL HIGH (ref 15–41)
Albumin: 1.5 g/dL — ABNORMAL LOW (ref 3.5–5.0)
Alkaline Phosphatase: 607 U/L — ABNORMAL HIGH (ref 38–126)
Anion gap: 12 (ref 5–15)
BUN: 15 mg/dL (ref 8–23)
CO2: 19 mmol/L — ABNORMAL LOW (ref 22–32)
Calcium: 8 mg/dL — ABNORMAL LOW (ref 8.9–10.3)
Chloride: 100 mmol/L (ref 98–111)
Creatinine, Ser: 1.41 mg/dL — ABNORMAL HIGH (ref 0.44–1.00)
GFR calc Af Amer: 39 mL/min — ABNORMAL LOW (ref >60)
GFR calc non Af Amer: 33 mL/min — ABNORMAL LOW (ref >60)
Glucose, Bld: 119 mg/dL — ABNORMAL HIGH (ref 70–99)
Potassium: 3.2 mmol/L — ABNORMAL LOW (ref 3.5–5.1)
Sodium: 131 mmol/L — ABNORMAL LOW (ref 135–145)
Total Bilirubin: 7.4 mg/dL — ABNORMAL HIGH (ref 0.3–1.2)
Total Protein: 6 g/dL — ABNORMAL LOW (ref 6.5–8.1)

## 2019-05-19 LAB — LACTIC ACID, PLASMA
Lactic Acid, Venous: 1.5 mmol/L (ref 0.5–1.9)
Lactic Acid, Venous: 1.5 mmol/L (ref 0.5–1.9)

## 2019-05-19 LAB — GAMMA GT: GGT: 1090 U/L — ABNORMAL HIGH (ref 7–50)

## 2019-05-19 LAB — IRON AND TIBC
Iron: 46 ug/dL (ref 28–170)
Saturation Ratios: 34 % — ABNORMAL HIGH (ref 10.4–31.8)
TIBC: 137 ug/dL — ABNORMAL LOW (ref 250–450)
UIBC: 91 ug/dL

## 2019-05-19 LAB — BILIRUBIN, FRACTIONATED(TOT/DIR/INDIR)
Bilirubin, Direct: 5.2 mg/dL — ABNORMAL HIGH (ref 0.0–0.2)
Indirect Bilirubin: 2.8 mg/dL — ABNORMAL HIGH (ref 0.3–0.9)
Total Bilirubin: 8 mg/dL — ABNORMAL HIGH (ref 0.3–1.2)

## 2019-05-19 LAB — VITAMIN B12: Vitamin B-12: 4543 pg/mL — ABNORMAL HIGH (ref 180–914)

## 2019-05-19 LAB — CK: Total CK: 550 U/L — ABNORMAL HIGH (ref 38–234)

## 2019-05-19 LAB — URINE CULTURE: Culture: <10000 — AB

## 2019-05-19 LAB — PROTIME-INR
INR: 1.2 (ref 0.8–1.2)
Prothrombin Time: 14.4 seconds (ref 11.4–15.2)

## 2019-05-19 LAB — GLUCOSE, CAPILLARY
Glucose-Capillary: 110 mg/dL — ABNORMAL HIGH (ref 70–99)
Glucose-Capillary: 103 mg/dL — ABNORMAL HIGH (ref 70–99)
Glucose-Capillary: 92 mg/dL (ref 70–99)
Glucose-Capillary: 105 mg/dL — ABNORMAL HIGH (ref 70–99)

## 2019-05-19 LAB — RETICULOCYTES
Immature Retic Fract: 14 % (ref 2.3–15.9)
RBC.: 3.06 MIL/uL — ABNORMAL LOW (ref 3.87–5.11)
Retic Count, Absolute: 61.9 10*3/uL (ref 19.0–186.0)
Retic Ct Pct: 2 % (ref 0.4–3.1)

## 2019-05-19 LAB — ECHOCARDIOGRAM COMPLETE
Height: 67 in
Weight: 2320 oz

## 2019-05-19 LAB — TSH: TSH: 4.886 u[IU]/mL — ABNORMAL HIGH (ref 0.350–4.500)

## 2019-05-19 LAB — FOLATE: Folate: 14.9 ng/mL (ref >5.9)

## 2019-05-19 LAB — FERRITIN: Ferritin: 771 ng/mL — ABNORMAL HIGH (ref 11–307)

## 2019-05-19 LAB — LIPASE, BLOOD: Lipase: 996 U/L — ABNORMAL HIGH (ref 11–51)

## 2019-05-19 IMAGING — MR MR HEAD W/O CM
6 of 11 series · 26 of 48 positions shown · IV contrast (gadavist)
Comparison: Prior head CT from [DATE].

CLINICAL DATA: Initial evaluation for acute slurred speech,
left-sided facial droop.

EXAM:
MR HEAD WITHOUT CONTRAST
MR CIRCLE OF WILLIS WITHOUT CONTRAST
MRA OF THE NECK WITHOUT AND WITH CONTRAST
TECHNIQUE: Multiplanar, multiecho pulse sequences of the brain, circle of
Willis and surrounding structures were obtained without intravenous
contrast. Angiographic images of the neck were obtained using MRA
technique without and with intravenous contrast.
CONTRAST:  6.5 cc of Gadavist.

[Series 2: DWI · axial · 3.0mm · 0.94mm/px · z∈[-62,+78]mm · 9 of 96 slices shown (1 of 2)]
[im 1/96]
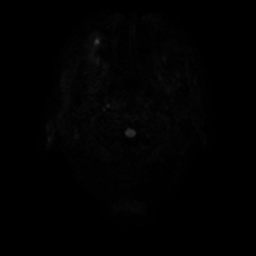
[im 12/96]
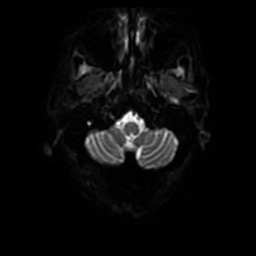
[im 24/96]
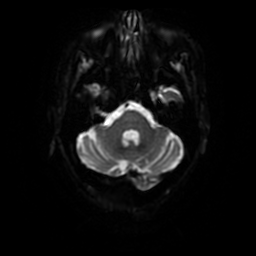
[im 36/96]
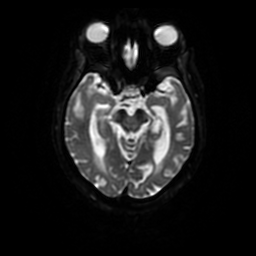
[im 48/96]
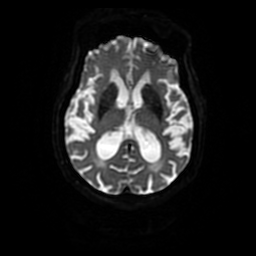
[im 60/96]
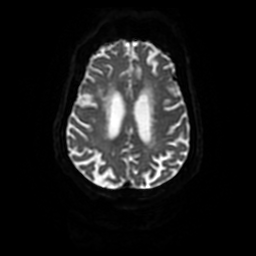
[im 72/96]
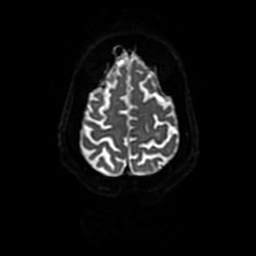
[im 84/96]
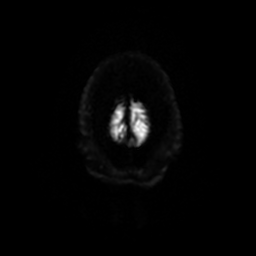
[im 96/96]
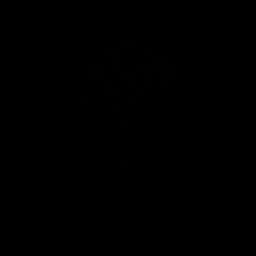

[Series 3: DWI · coronal · 4.0mm · 0.94mm/px · 6 of 74 slices shown (2 of 2)]
[im 1/74]
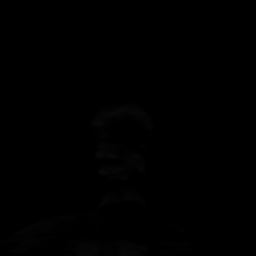
[im 15/74]
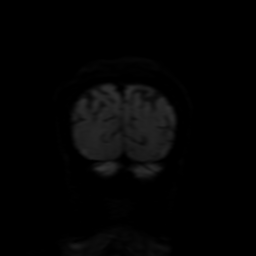
[im 30/74]
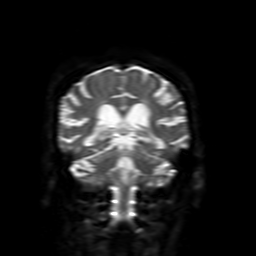
[im 44/74]
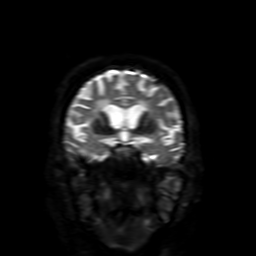
[im 59/74]
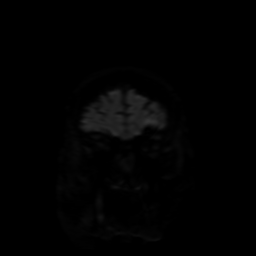
[im 74/74]
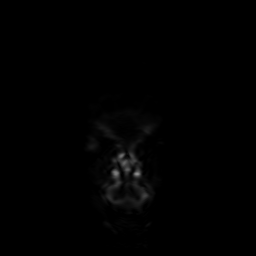

[Series 4: FLAIR · sagittal · 5.0mm · 0.23mm/px · 2 of 26 slices shown (1 of 2)]
[im 1/26]
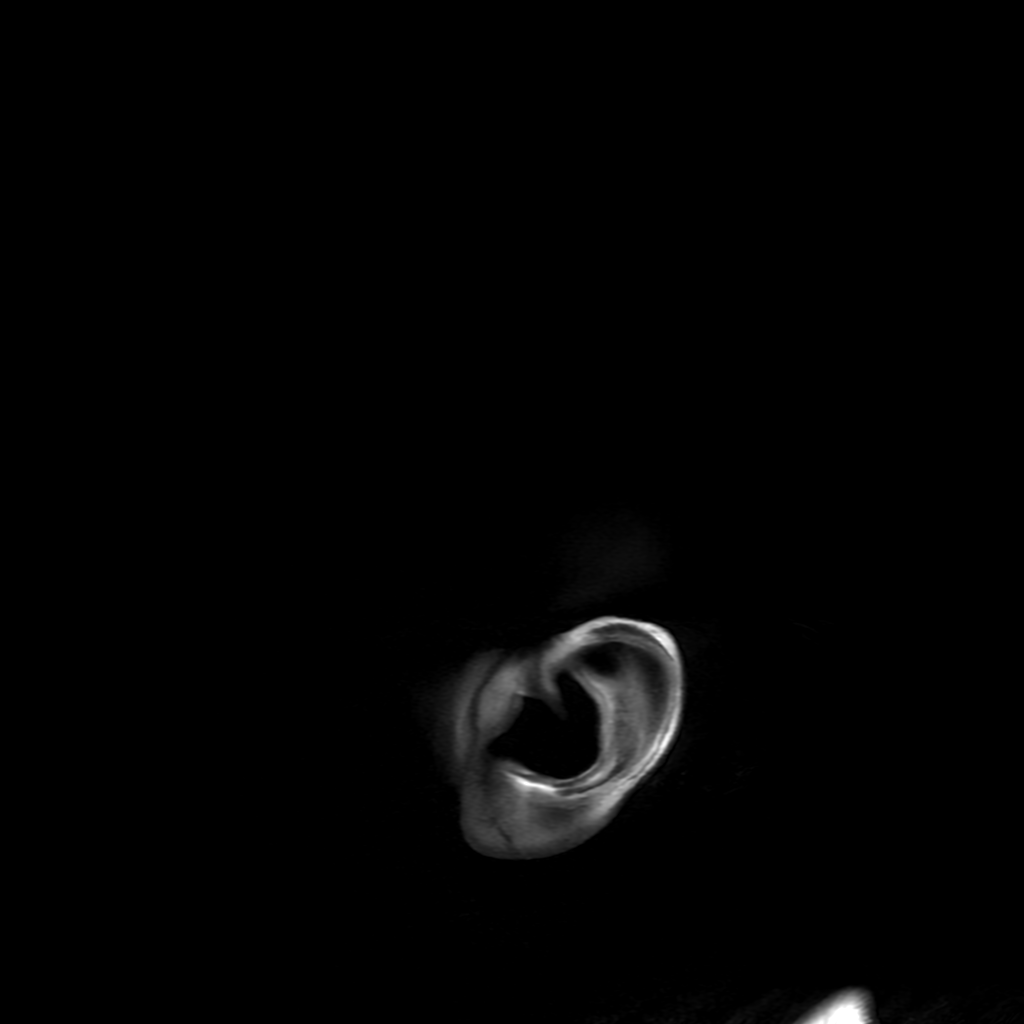
[im 26/26]
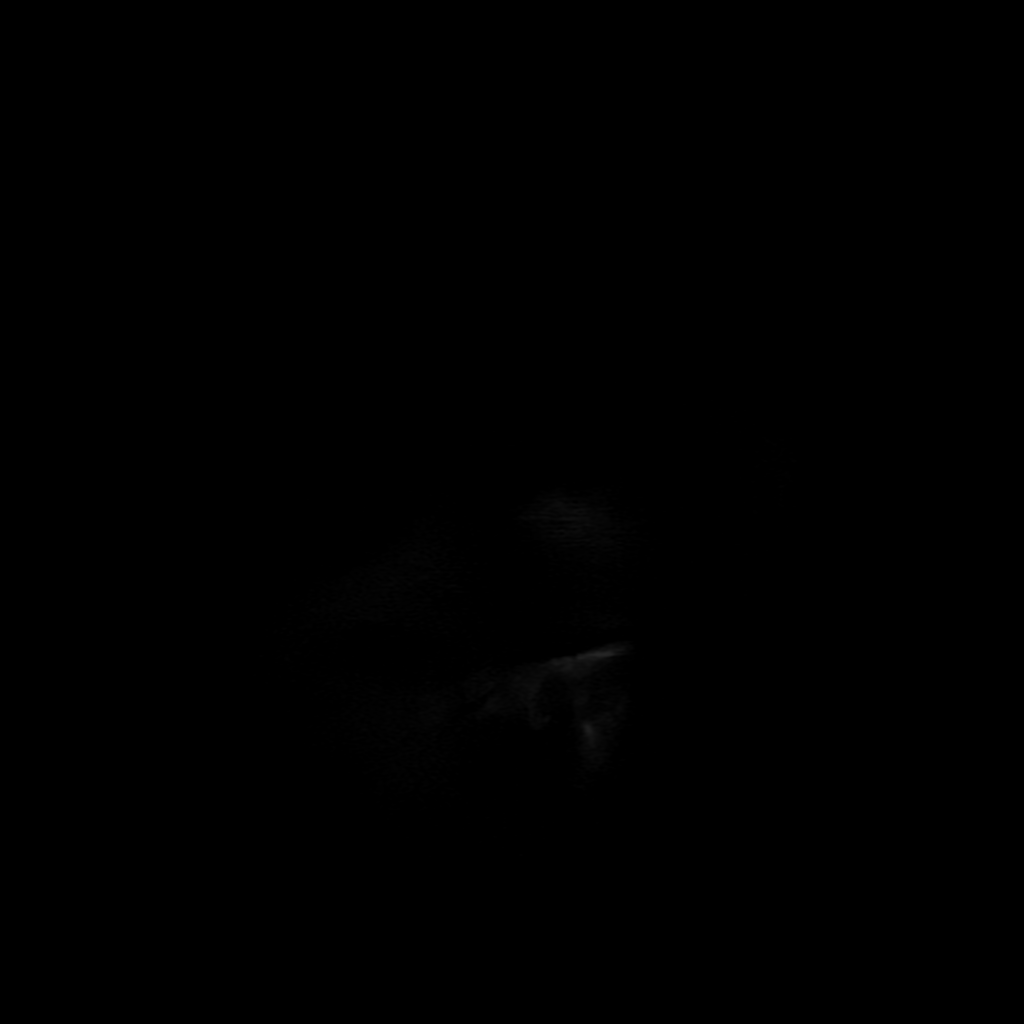

[Series 6: FLAIR · axial · 3.0mm · 0.41mm/px · z∈[-62,+75]mm · 2 of 24 slices shown (2 of 2)]
[im 1/24]
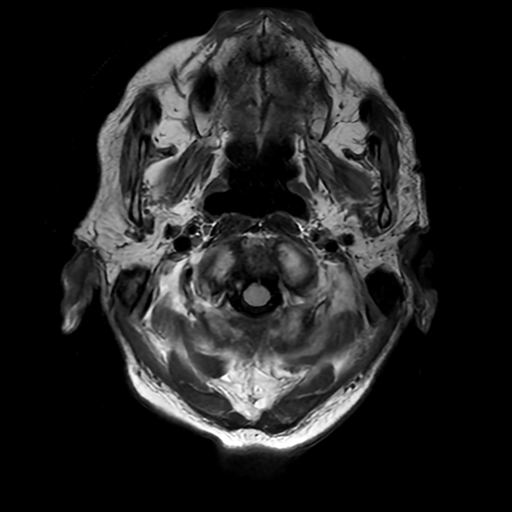
[im 24/24]
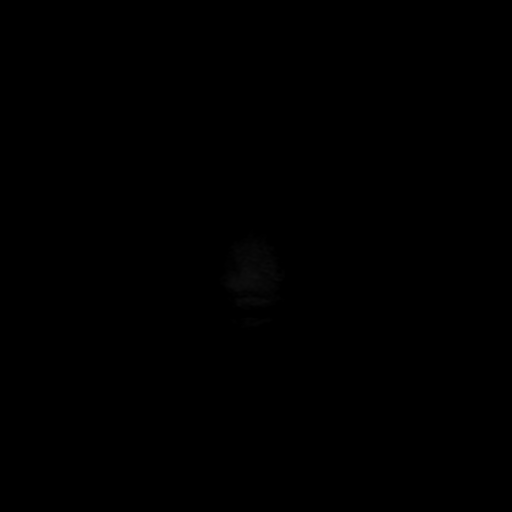

[Series 250: ADC · axial · 3.0mm · 0.94mm/px · z∈[-62,+75]mm · 4 of 47 slices shown (1 of 2)]
[im 1/47]
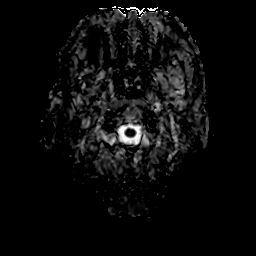
[im 16/47]
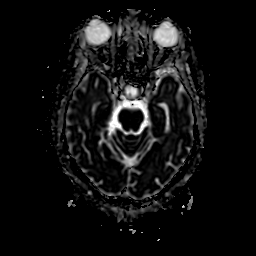
[im 31/47]
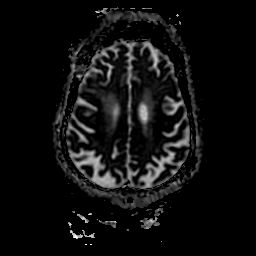
[im 47/47]
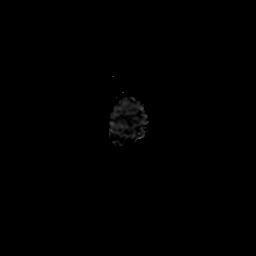

[Series 350: ADC · coronal · 4.0mm · 0.94mm/px · 3 of 37 slices shown (2 of 2)]
[im 1/37]
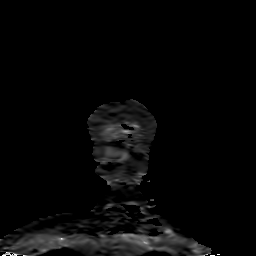
[im 19/37]
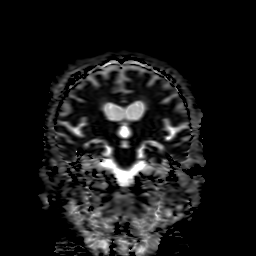
[im 37/37]
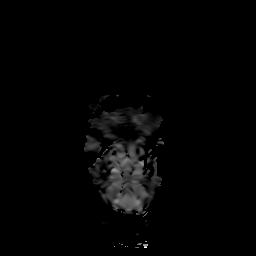

[26 of 48 positions shown; findings below may reference images not displayed]

FINDINGS: MRI HEAD FINDINGS

Brain: Diffuse prominence of the CSF containing spaces compatible
with generalized age-related cerebral atrophy. Patchy and confluent
T2/FLAIR hyperintensity within the periventricular and deep white
matter both cerebral hemispheres most consistent with chronic small
vessel ischemic disease, mild for age.

No abnormal foci of restricted diffusion to suggest acute or
subacute ischemia. Gray-white matter differentiation maintained. No
encephalomalacia to suggest chronic cortical infarction. No evidence
for acute or chronic intracranial hemorrhage.

8 mm meningioma overlies the left frontal convexity without
associated mass effect (series 6, image 15). No other mass lesion,
mass effect, or midline shift. Diffuse ventricular prominence
related to global parenchymal volume loss without hydrocephalus. No
extra-axial fluid collection. Note made of a partially empty sella.
Midline structures intact.

Vascular: Major intracranial vascular flow voids are maintained.

Skull and upper cervical spine: Craniocervical junction within
normal limits. Multilevel degenerative spondylolysis noted within
the upper cervical spine with resultant moderate spinal stenosis at
the level of C3-4. Bone marrow signal intensity within normal
limits. No focal marrow replacing lesion. No scalp soft tissue
abnormality.

Sinuses/Orbits: Patient status post bilateral ocular lens
replacement. Small amount of pneumatized secretions noted within the
left sphenoid sinus. Paranasal sinuses are otherwise largely clear.
No significant mastoid effusion. Inner ear structures grossly
normal.

Other: None.

MRA HEAD FINDINGS

ANTERIOR CIRCULATION:

Visualized distal cervical segments of the internal carotid arteries
are patent with symmetric antegrade flow. Petrous, cavernous, and
supraclinoid ICAs patent without flow-limiting stenosis. A1 segments
widely patent. Normal anterior communicating artery complex.
Anterior cerebral arteries patent to their distal aspects without
stenosis. No M1 stenosis or occlusion. Normal MCA bifurcations.
Distal MCA branches well perfused and symmetric.

POSTERIOR CIRCULATION:

Partially visualized vertebral arteries widely patent. Right
vertebral artery dominant. Both picas patent. Basilar patent to its
distal aspect without stenosis. Superior cerebral arteries patent
bilaterally. Right PCA supplied via the basilar. Predominant fetal
type origin of the left PCA. Both PCAs well perfused to their distal
aspects.

No intracranial aneurysm.

MRA NECK FINDINGS:

AORTIC ARCH: Examination degraded by motion artifact.

Visualized aortic arch of normal caliber with normal 3 vessel
morphology. No flow-limiting stenosis about the origin of the great
vessels. Visualized subclavian arteries widely patent.

RIGHT CAROTID SYSTEM: Right CCA tortuous but widely patent to the
bifurcation without stenosis. Atheromatous irregularity about the
right carotid bifurcation with associated stenosis of up to
approximate 50% by NASCET criteria. Right ICA patent distally to the
skull base without stenosis or occlusion.

LEFT CAROTID SYSTEM: Left CCA patent from its origin to the
bifurcation without stenosis. Atheromatous irregularity about the
left carotid bifurcation with associated stenosis of up to
approximately 50% by NASCET criteria. Left ICA otherwise patent
distally to the skull base without stenosis or occlusion.

VERTEBRAL ARTERIES: Both vertebral arteries arise from the
subclavian arteries. Right vertebral artery dominant. Vertebral
arteries not well assessed proximally due to motion. Visualized
portions are mildly tortuous but widely patent without stenosis or
occlusion.
IMPRESSION: MRI HEAD IMPRESSION:

1. No acute intracranial infarct or other abnormality.
2. Generalized age advanced cerebral atrophy with mild chronic small
vessel ischemic disease.
3. 8 mm meningioma overlying the left frontal convexity without
associated mass effect.

MRA HEAD IMPRESSION:

Negative intracranial MRA with no large vessel occlusion. No
hemodynamically significant or correctable stenosis.

MRA NECK IMPRESSION:

1. Atheromatous change about the carotid bifurcations with estimated
stenoses of up to approximately 50% by NASCET criteria bilaterally.
2. Patent vertebral arteries within the neck. Right vertebral artery
dominant.

## 2019-05-19 IMAGING — MR MR MRA NECK WO/W CM
5 of 11 series · 25 of 48 positions shown · IV contrast (Gadavist)
Comparison: Prior head CT from [DATE].

CLINICAL DATA: Initial evaluation for acute slurred speech,
left-sided facial droop.

EXAM:
MR HEAD WITHOUT CONTRAST
MR CIRCLE OF WILLIS WITHOUT CONTRAST
MRA OF THE NECK WITHOUT AND WITH CONTRAST
TECHNIQUE: Multiplanar, multiecho pulse sequences of the brain, circle of
Willis and surrounding structures were obtained without intravenous
contrast. Angiographic images of the neck were obtained using MRA
technique without and with intravenous contrast.
CONTRAST:  6.5 cc of Gadavist.

[Series 8: tof_fl3d_tra_iso · axial · B · 0.6mm · 0.65mm/px · z∈[-300,-132]mm · 9 of 288 slices shown]
[im 1/288]
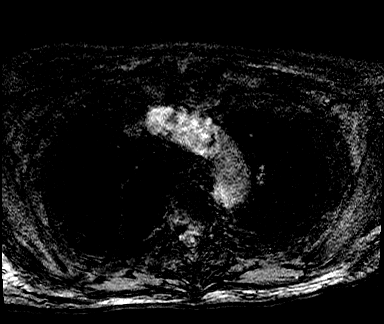
[im 42/288]
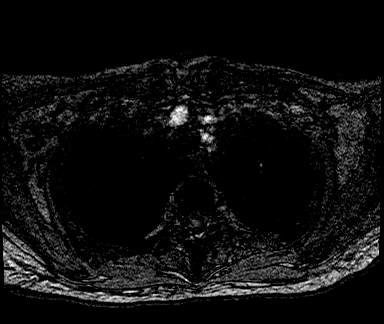
[im 83/288]
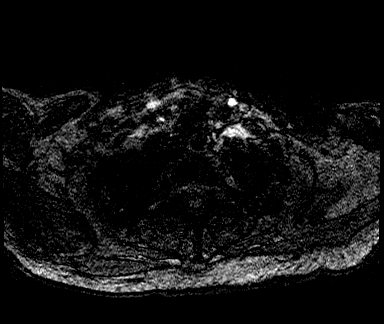
[im 124/288]
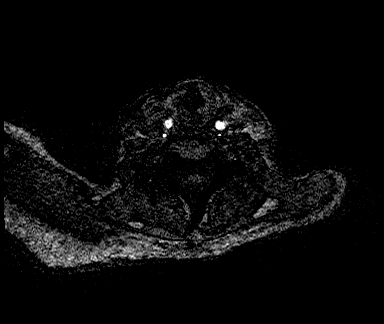
[im 144/288]
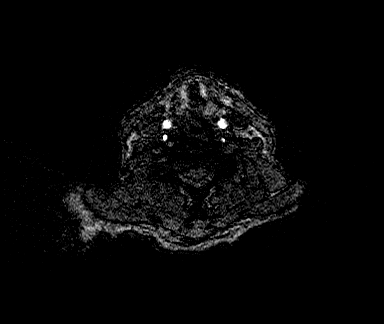
[im 165/288]
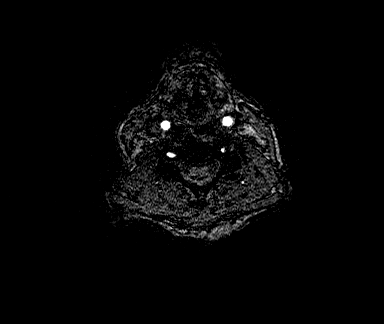
[im 206/288]
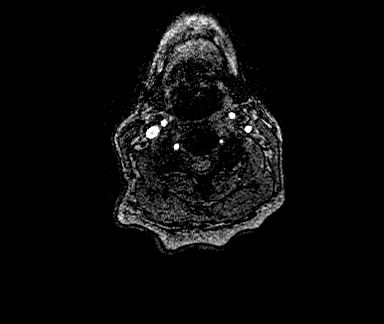
[im 247/288]
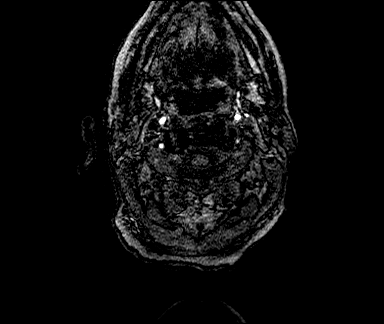
[im 288/288]
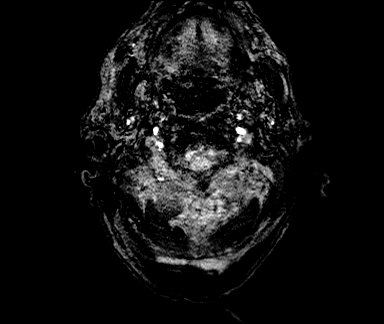

[Series 11: angio_fl3d_cor_highres_pre_ttc=3.0s · coronal · B · 0.9mm · 0.62mm/px · 4 of 80 slices shown]
[im 1/80]
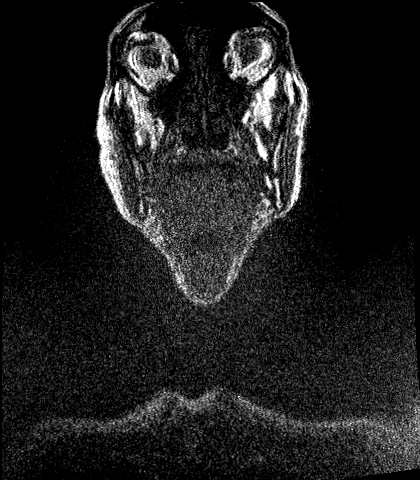
[im 27/80]
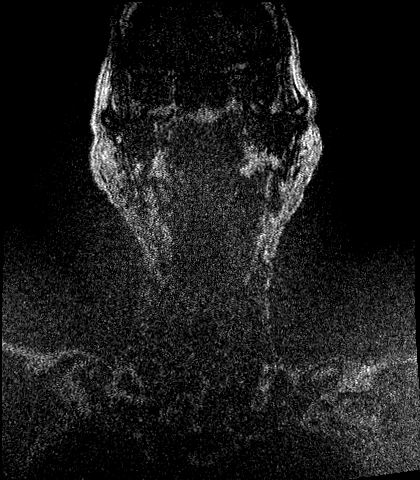
[im 53/80]
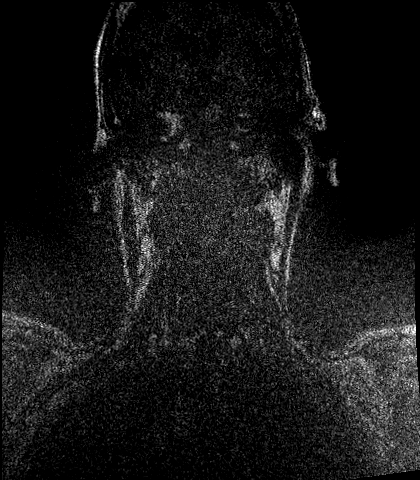
[im 80/80]
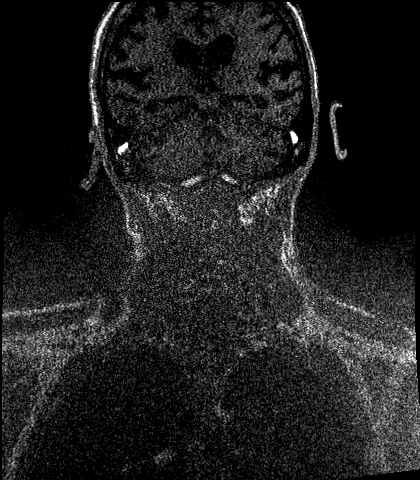

[Series 13: angio_fl3d_cor_highres_post_ttc=3.0s · coronal · B · 0.9mm · 0.62mm/px · 4 of 80 slices shown]
[im 1/80]
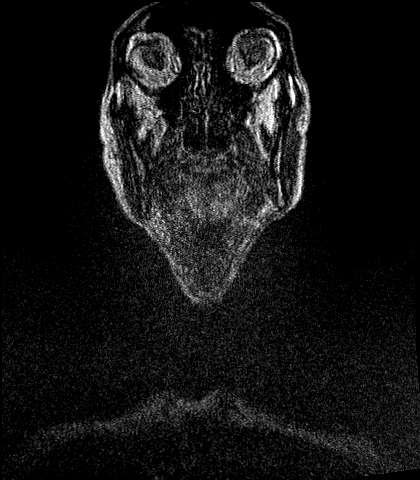
[im 27/80]
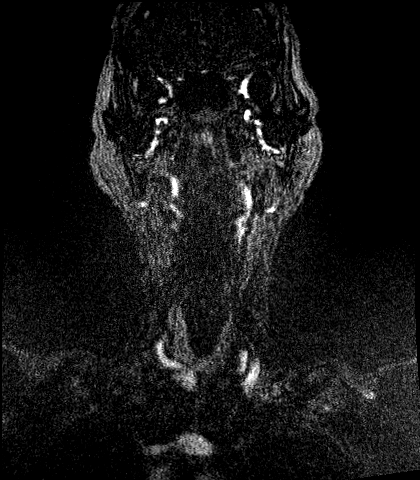
[im 53/80]
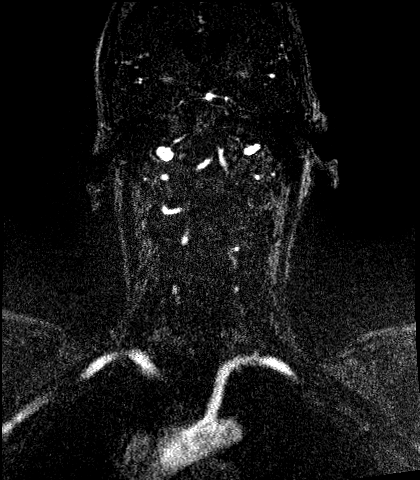
[im 80/80]
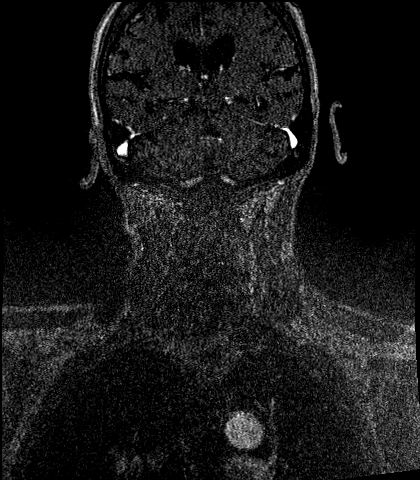

[Series 14: angio_fl3d_cor_highres_post_ttc=3.0s_moco-adv · coronal · B · 0.9mm · 0.62mm/px · 4 of 80 slices shown]
[im 1/80]
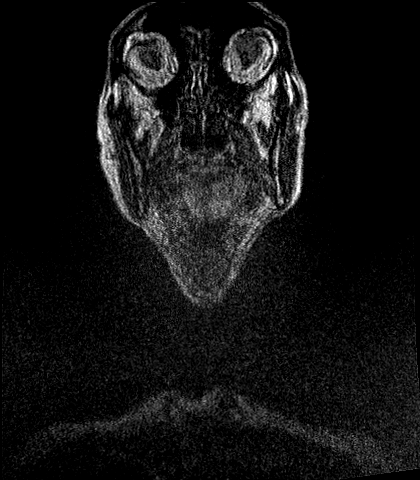
[im 27/80]
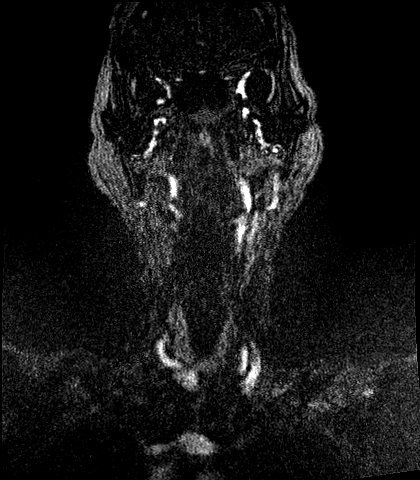
[im 53/80]
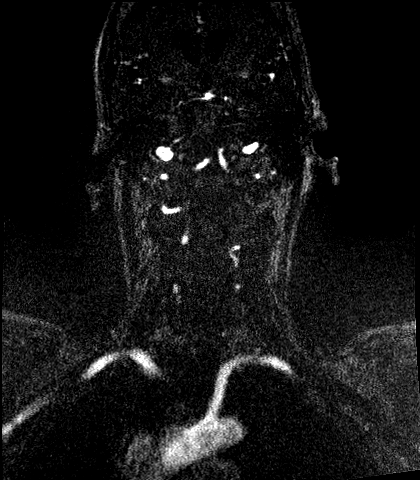
[im 80/80]
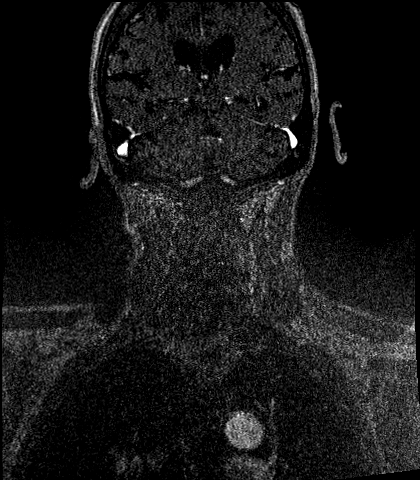

[Series 15: angio_fl3d_cor_highres_post_ttc=3.0s_moco-adv_sub · coronal · B · 0.9mm · 0.62mm/px · 4 of 80 slices shown]
[im 1/80]
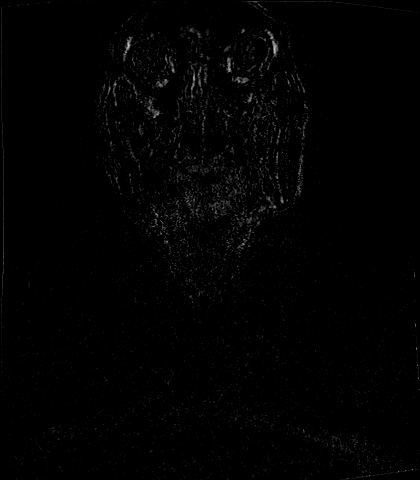
[im 27/80]
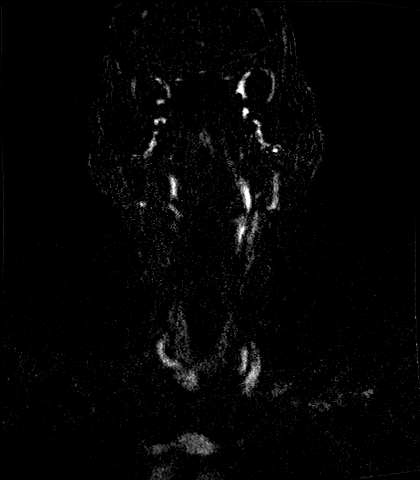
[im 53/80]
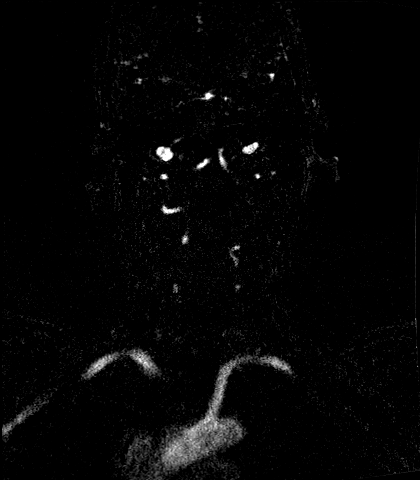
[im 80/80]
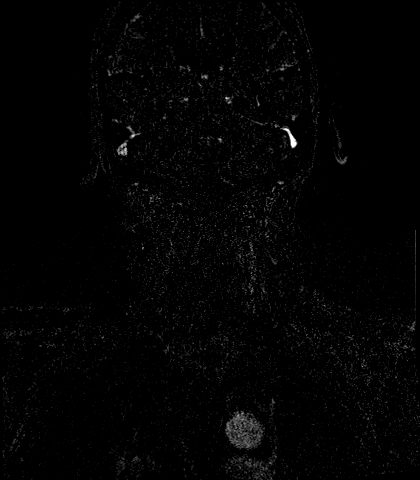

[25 of 48 positions shown; findings below may reference images not displayed]

FINDINGS: MRI HEAD FINDINGS

Brain: Diffuse prominence of the CSF containing spaces compatible
with generalized age-related cerebral atrophy. Patchy and confluent
T2/FLAIR hyperintensity within the periventricular and deep white
matter both cerebral hemispheres most consistent with chronic small
vessel ischemic disease, mild for age.

No abnormal foci of restricted diffusion to suggest acute or
subacute ischemia. Gray-white matter differentiation maintained. No
encephalomalacia to suggest chronic cortical infarction. No evidence
for acute or chronic intracranial hemorrhage.

8 mm meningioma overlies the left frontal convexity without
associated mass effect (series 6, image 15). No other mass lesion,
mass effect, or midline shift. Diffuse ventricular prominence
related to global parenchymal volume loss without hydrocephalus. No
extra-axial fluid collection. Note made of a partially empty sella.
Midline structures intact.

Vascular: Major intracranial vascular flow voids are maintained.

Skull and upper cervical spine: Craniocervical junction within
normal limits. Multilevel degenerative spondylolysis noted within
the upper cervical spine with resultant moderate spinal stenosis at
the level of C3-4. Bone marrow signal intensity within normal
limits. No focal marrow replacing lesion. No scalp soft tissue
abnormality.

Sinuses/Orbits: Patient status post bilateral ocular lens
replacement. Small amount of pneumatized secretions noted within the
left sphenoid sinus. Paranasal sinuses are otherwise largely clear.
No significant mastoid effusion. Inner ear structures grossly
normal.

Other: None.

MRA HEAD FINDINGS

ANTERIOR CIRCULATION:

Visualized distal cervical segments of the internal carotid arteries
are patent with symmetric antegrade flow. Petrous, cavernous, and
supraclinoid ICAs patent without flow-limiting stenosis. A1 segments
widely patent. Normal anterior communicating artery complex.
Anterior cerebral arteries patent to their distal aspects without
stenosis. No M1 stenosis or occlusion. Normal MCA bifurcations.
Distal MCA branches well perfused and symmetric.

POSTERIOR CIRCULATION:

Partially visualized vertebral arteries widely patent. Right
vertebral artery dominant. Both picas patent. Basilar patent to its
distal aspect without stenosis. Superior cerebral arteries patent
bilaterally. Right PCA supplied via the basilar. Predominant fetal
type origin of the left PCA. Both PCAs well perfused to their distal
aspects.

No intracranial aneurysm.

MRA NECK FINDINGS:

AORTIC ARCH: Examination degraded by motion artifact.

Visualized aortic arch of normal caliber with normal 3 vessel
morphology. No flow-limiting stenosis about the origin of the great
vessels. Visualized subclavian arteries widely patent.

RIGHT CAROTID SYSTEM: Right CCA tortuous but widely patent to the
bifurcation without stenosis. Atheromatous irregularity about the
right carotid bifurcation with associated stenosis of up to
approximate 50% by NASCET criteria. Right ICA patent distally to the
skull base without stenosis or occlusion.

LEFT CAROTID SYSTEM: Left CCA patent from its origin to the
bifurcation without stenosis. Atheromatous irregularity about the
left carotid bifurcation with associated stenosis of up to
approximately 50% by NASCET criteria. Left ICA otherwise patent
distally to the skull base without stenosis or occlusion.

VERTEBRAL ARTERIES: Both vertebral arteries arise from the
subclavian arteries. Right vertebral artery dominant. Vertebral
arteries not well assessed proximally due to motion. Visualized
portions are mildly tortuous but widely patent without stenosis or
occlusion.
IMPRESSION: MRI HEAD IMPRESSION:

1. No acute intracranial infarct or other abnormality.
2. Generalized age advanced cerebral atrophy with mild chronic small
vessel ischemic disease.
3. 8 mm meningioma overlying the left frontal convexity without
associated mass effect.

MRA HEAD IMPRESSION:

Negative intracranial MRA with no large vessel occlusion. No
hemodynamically significant or correctable stenosis.

MRA NECK IMPRESSION:

1. Atheromatous change about the carotid bifurcations with estimated
stenoses of up to approximately 50% by NASCET criteria bilaterally.
2. Patent vertebral arteries within the neck. Right vertebral artery
dominant.

## 2019-05-19 IMAGING — MR MR MRA HEAD W/O CM
1 series · 16 of 48 positions shown · IV contrast (gadavist)
Comparison: Prior head CT from [DATE].

CLINICAL DATA: Initial evaluation for acute slurred speech,
left-sided facial droop.

EXAM:
MR HEAD WITHOUT CONTRAST
MR CIRCLE OF WILLIS WITHOUT CONTRAST
MRA OF THE NECK WITHOUT AND WITH CONTRAST
TECHNIQUE: Multiplanar, multiecho pulse sequences of the brain, circle of
Willis and surrounding structures were obtained without intravenous
contrast. Angiographic images of the neck were obtained using MRA
technique without and with intravenous contrast.
CONTRAST:  6.5 cc of Gadavist.

[Series 2: ax (id) · axial · 1.0mm · 0.43mm/px · z∈[-59,+28]mm · 16 of 184 slices shown]
[im 1/184]
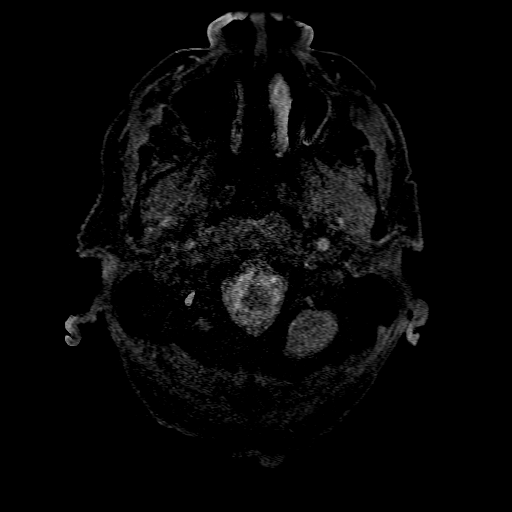
[im 4/184]
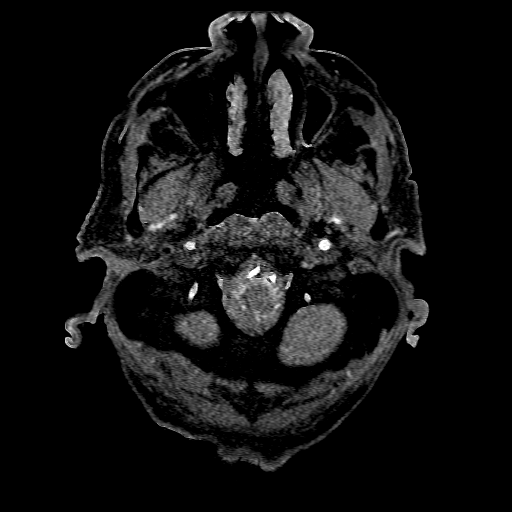
[im 8/184]
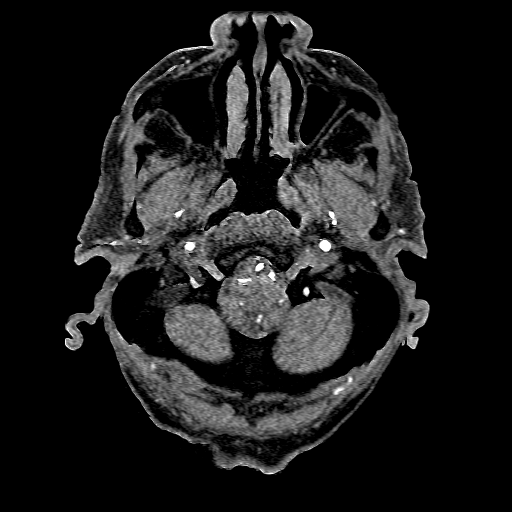
[im 12/184]
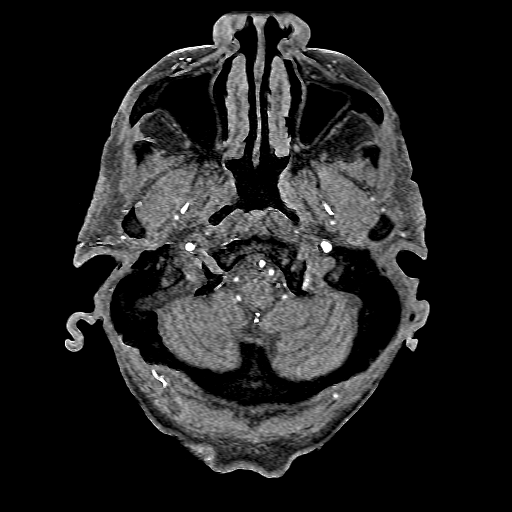
[im 16/184]
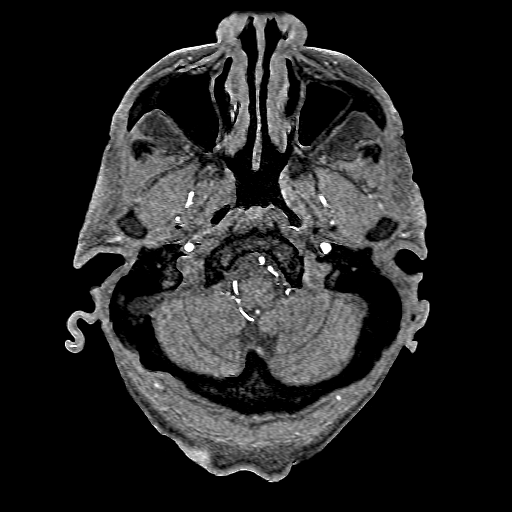
[im 20/184]
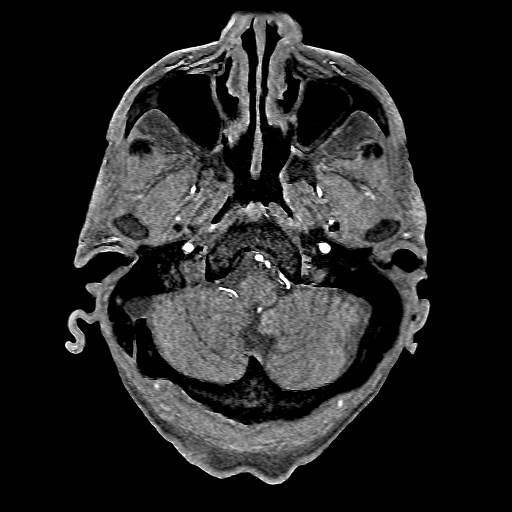
[im 32/184]
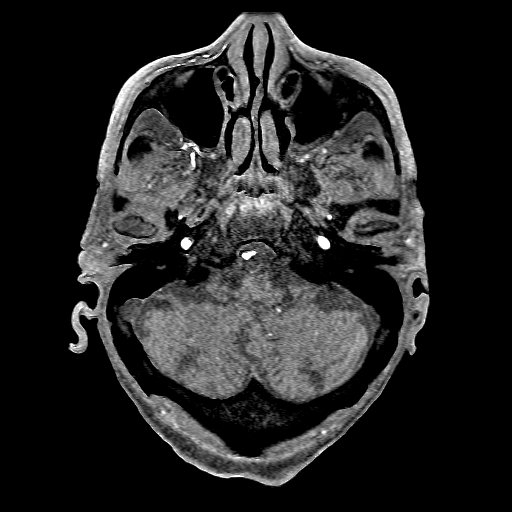
[im 36/184]
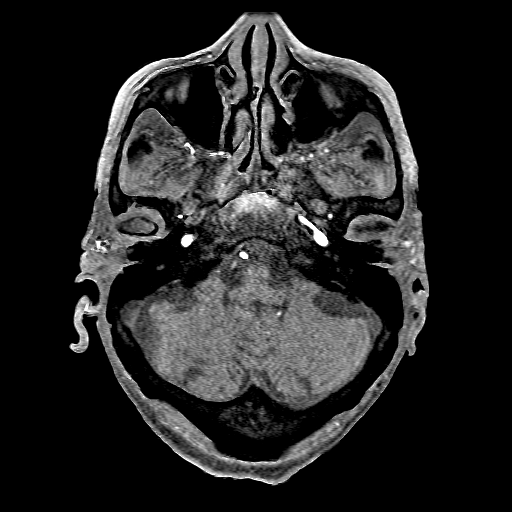
[im 59/184]
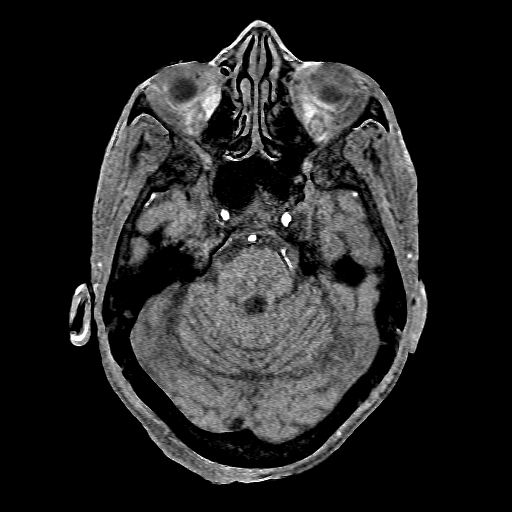
[im 82/184]
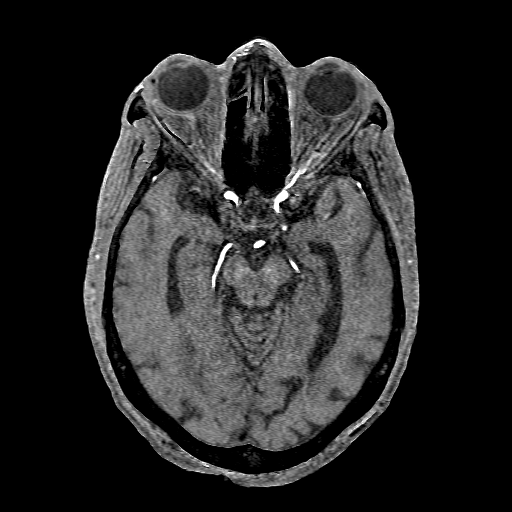
[im 94/184]
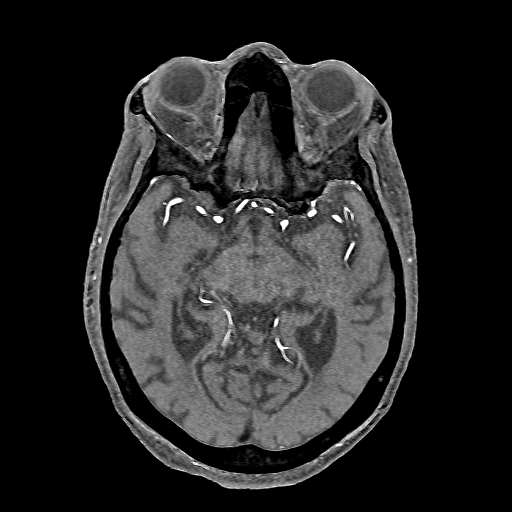
[im 106/184]
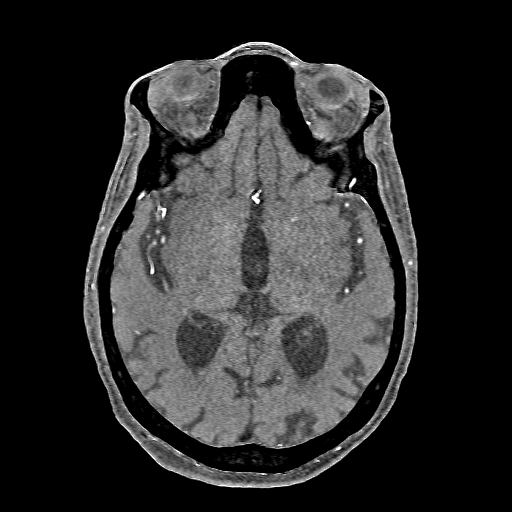
[im 129/184]
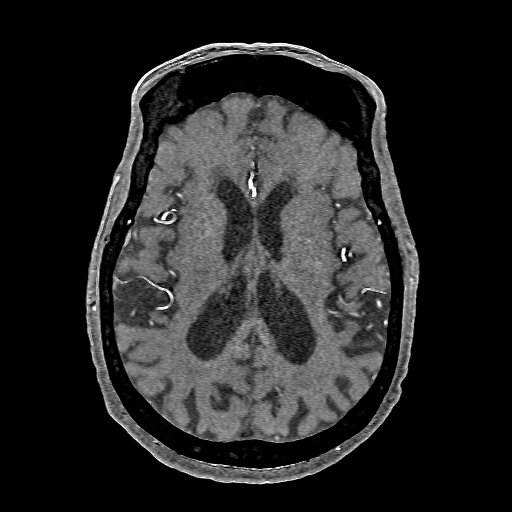
[im 152/184]
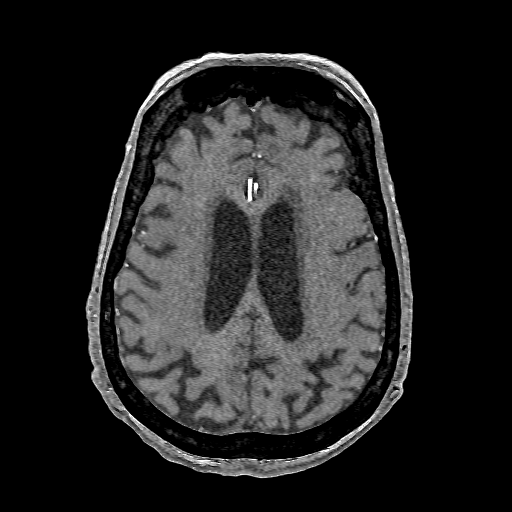
[im 156/184]
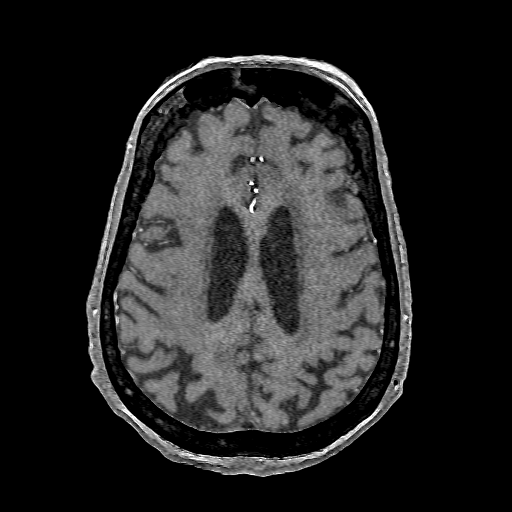
[im 176/184]
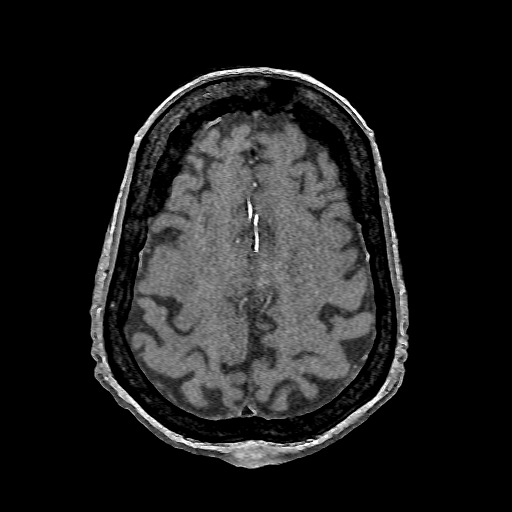

[16 of 48 positions shown; findings below may reference images not displayed]

FINDINGS: MRI HEAD FINDINGS

Brain: Diffuse prominence of the CSF containing spaces compatible
with generalized age-related cerebral atrophy. Patchy and confluent
T2/FLAIR hyperintensity within the periventricular and deep white
matter both cerebral hemispheres most consistent with chronic small
vessel ischemic disease, mild for age.

No abnormal foci of restricted diffusion to suggest acute or
subacute ischemia. Gray-white matter differentiation maintained. No
encephalomalacia to suggest chronic cortical infarction. No evidence
for acute or chronic intracranial hemorrhage.

8 mm meningioma overlies the left frontal convexity without
associated mass effect (series 6, image 15). No other mass lesion,
mass effect, or midline shift. Diffuse ventricular prominence
related to global parenchymal volume loss without hydrocephalus. No
extra-axial fluid collection. Note made of a partially empty sella.
Midline structures intact.

Vascular: Major intracranial vascular flow voids are maintained.

Skull and upper cervical spine: Craniocervical junction within
normal limits. Multilevel degenerative spondylolysis noted within
the upper cervical spine with resultant moderate spinal stenosis at
the level of C3-4. Bone marrow signal intensity within normal
limits. No focal marrow replacing lesion. No scalp soft tissue
abnormality.

Sinuses/Orbits: Patient status post bilateral ocular lens
replacement. Small amount of pneumatized secretions noted within the
left sphenoid sinus. Paranasal sinuses are otherwise largely clear.
No significant mastoid effusion. Inner ear structures grossly
normal.

Other: None.

MRA HEAD FINDINGS

ANTERIOR CIRCULATION:

Visualized distal cervical segments of the internal carotid arteries
are patent with symmetric antegrade flow. Petrous, cavernous, and
supraclinoid ICAs patent without flow-limiting stenosis. A1 segments
widely patent. Normal anterior communicating artery complex.
Anterior cerebral arteries patent to their distal aspects without
stenosis. No M1 stenosis or occlusion. Normal MCA bifurcations.
Distal MCA branches well perfused and symmetric.

POSTERIOR CIRCULATION:

Partially visualized vertebral arteries widely patent. Right
vertebral artery dominant. Both picas patent. Basilar patent to its
distal aspect without stenosis. Superior cerebral arteries patent
bilaterally. Right PCA supplied via the basilar. Predominant fetal
type origin of the left PCA. Both PCAs well perfused to their distal
aspects.

No intracranial aneurysm.

MRA NECK FINDINGS:

AORTIC ARCH: Examination degraded by motion artifact.

Visualized aortic arch of normal caliber with normal 3 vessel
morphology. No flow-limiting stenosis about the origin of the great
vessels. Visualized subclavian arteries widely patent.

RIGHT CAROTID SYSTEM: Right CCA tortuous but widely patent to the
bifurcation without stenosis. Atheromatous irregularity about the
right carotid bifurcation with associated stenosis of up to
approximate 50% by NASCET criteria. Right ICA patent distally to the
skull base without stenosis or occlusion.

LEFT CAROTID SYSTEM: Left CCA patent from its origin to the
bifurcation without stenosis. Atheromatous irregularity about the
left carotid bifurcation with associated stenosis of up to
approximately 50% by NASCET criteria. Left ICA otherwise patent
distally to the skull base without stenosis or occlusion.

VERTEBRAL ARTERIES: Both vertebral arteries arise from the
subclavian arteries. Right vertebral artery dominant. Vertebral
arteries not well assessed proximally due to motion. Visualized
portions are mildly tortuous but widely patent without stenosis or
occlusion.
IMPRESSION: MRI HEAD IMPRESSION:

1. No acute intracranial infarct or other abnormality.
2. Generalized age advanced cerebral atrophy with mild chronic small
vessel ischemic disease.
3. 8 mm meningioma overlying the left frontal convexity without
associated mass effect.

MRA HEAD IMPRESSION:

Negative intracranial MRA with no large vessel occlusion. No
hemodynamically significant or correctable stenosis.

MRA NECK IMPRESSION:

1. Atheromatous change about the carotid bifurcations with estimated
stenoses of up to approximately 50% by NASCET criteria bilaterally.
2. Patent vertebral arteries within the neck. Right vertebral artery
dominant.

## 2019-05-19 MED ORDER — BOOST / RESOURCE BREEZE PO LIQD CUSTOM
1.0000 | Freq: Three times a day (TID) | ORAL | Status: DC
  Administered 2019-05-19 – 2019-05-20 (×2): 1 via ORAL

## 2019-05-19 MED ORDER — ASPIRIN 325 MG PO TABS
325.0000 mg | ORAL_TABLET | Freq: Every day | ORAL | Status: DC
  Administered 2019-05-19 – 2019-05-23 (×4): 325 mg via ORAL
  Filled 2019-05-19 (×5): qty 1

## 2019-05-19 MED ORDER — SENNA 8.6 MG PO TABS: 1.0000 | ORAL_TABLET | Freq: Every evening | ORAL | Status: DC | PRN

## 2019-05-19 MED ORDER — SODIUM CHLORIDE 0.9 % IV SOLN: INTRAVENOUS | Status: DC

## 2019-05-19 MED ORDER — GADOBUTROL 1 MMOL/ML IV SOLN
6.5000 mL | Freq: Once | INTRAVENOUS | Status: AC | PRN
  Administered 2019-05-19: 6.5 mL via INTRAVENOUS

## 2019-05-19 NOTE — Consult Note (Addendum)
Neurology Consultation  Reason for Consult: Possible stroke  Referring Physician: Mercy Riding, MD  CC: Right-sided weakness and speech difficulties  History is obtained from: Family member and chart  HPI: Debra [name] is an 86 y.o. female with a past medical history of hypertension, hypercholesterolemia and diabetes.  Patient lives with her son, who noticed on 05/18/2019 that when patient woke up in the morning she was in her usual state of health.  She took her medications and ate breakfast. Not soon after this he noticed that patient had a left-sided facial droop in addition to slurring her words.  Patient was immediately brought to the hospital and on arrival her symptoms had resolved.  He stated symptoms lasted for about 30 minutes.  She denies any lower extremity weakness but now is describing some right upper extremity weakness.  Due to the RUE weakness Neurology was asked to consult for possible stroke.  Currently patient is not having any difficulties with speech.  She is mildly dysarthric but this is in the context of being edentulous.  She does admit to having right-sided weakness and is describing "lumps under my skin" along the ventral aspect of her right forearm.  Per daughter, who is at bedside, the patient does have memory loss and has been diagnosed with such by her primary care doctor, but has not been formally diagnosed with a dementia.  LKW: 2200 hrs. on 05/17/2019. tpa given?: no, out of window Premorbid modified Rankin scale (mRS): 3 NIH stroke scale: 2   Past Medical History:  Diagnosis Date  . CKD (chronic kidney disease)   . Diabetes mellitus without complication ([place])   . Gout   . Hypercholesteremia   . Hypertension     Family History  Problem Relation Age of Onset  . Diabetes Mother   . Hypertension Father   . Breast cancer Niece   . Colon cancer Neg Hx    Social History:   reports that she has never smoked. She has never used smokeless tobacco.  She reports that she does not drink alcohol or use drugs.  Medications:  No current facility-administered medications on file prior to encounter.   Current Outpatient Medications on File Prior to Encounter  Medication Sig Dispense Refill  . amLODipine (NORVASC) 10 MG tablet Take 10 mg by mouth daily.    Marland Kitchen ammonium lactate (AMLACTIN) 12 % cream Apply 1 application topically 2 (two) times daily as needed.    Marland Kitchen aspirin EC 81 MG tablet Take 81 mg by mouth daily.    Marland Kitchen atorvastatin (LIPITOR) 80 MG tablet Take 80 mg by mouth daily at 6 PM.    . colchicine 0.6 MG tablet Take 0.6 mg by mouth daily as needed.     . docusate sodium (COLACE) 100 MG capsule Take 100 mg by mouth daily.    . hydrochlorothiazide (HYDRODIURIL) 25 MG tablet Take 25 mg by mouth daily.    Marland Kitchen labetalol (NORMODYNE) 100 MG tablet Take 100 mg by mouth 2 (two) times daily.    Marland Kitchen latanoprost (XALATAN) 0.005 % ophthalmic solution Place 1 drop into both eyes at bedtime.     Marland Kitchen losartan (COZAAR) 100 MG tablet Take 100 mg by mouth daily.    . meclizine (ANTIVERT) 25 MG tablet Take 25 mg by mouth 3 (three) times daily as needed for dizziness.    . metFORMIN (GLUCOPHAGE-XR) 500 MG 24 hr tablet Take 500 mg by mouth 2 (two) times daily.    Marland Kitchen triamcinolone (KENALOG)  0.025 % cream Apply 1 application topically 2 (two) times daily as needed.        Current Facility-Administered Medications:  .  aspirin EC tablet 81 mg, 81 mg, Oral, Daily, Kathie Dike, MD, Stopped at 05/19/19 1030 .  heparin injection 5,000 Units, 5,000 Units, Subcutaneous, Q8H, Kathie Dike, MD, 5,000 Units at 05/19/19 0604 .  insulin aspart (novoLOG) injection 0-5 Units, 0-5 Units, Subcutaneous, QHS, Memon, Jehanzeb, MD .  insulin aspart (novoLOG) injection 0-9 Units, 0-9 Units, Subcutaneous, TID WC, Memon, Jehanzeb, MD .  lactated ringers 1,000 mL with potassium chloride 20 mEq infusion, , Intravenous, Continuous, Memon, Jehanzeb, MD, Last Rate: 100 mL/hr at 05/18/19  1739, New Bag at 05/18/19 1739 .  latanoprost (XALATAN) 0.005 % ophthalmic solution 1 drop, 1 drop, Both Eyes, QHS, Memon, Jehanzeb, MD, 1 drop at 05/18/19 2322 .  ondansetron (ZOFRAN) tablet 4 mg, 4 mg, Oral, Q6H PRN **OR** ondansetron (ZOFRAN) injection 4 mg, 4 mg, Intravenous, Q6H PRN, Memon, Jehanzeb, MD .  piperacillin-tazobactam (ZOSYN) IVPB 3.375 g, 3.375 g, Intravenous, Q8H, Memon, Jehanzeb, MD, Last Rate: 12.5 mL/hr at 05/19/19 0603, 3.375 g at 05/19/19 0603  ROS: Patient denies any vision problems, headache, neck pain, joint pain, incontinence, dizziness or vertigo, confusion, swelling, abdominal pain.  She does state that she has right forearm pain and weakness in the right arm.  Exam: Current vital signs: BP (!) 103/58 (BP Location: Right Arm)   Pulse 93   Temp 97.8 F (36.6 C) (Oral)   Resp (!) 22   Ht 5\' 7"  (1.702 m)   Wt 65.8 kg   SpO2 98%   BMI 22.71 kg/m  Vital signs in last 24 hours: Temp:  [97.8 F (36.6 C)-98.7 F (37.1 C)] 97.8 F (36.6 C) (05/04 0822) Pulse Rate:  [90-105] 93 (05/04 0822) Resp:  [18-27] 22 (05/04 0822) BP: (90-111)/(43-67) 103/58 (05/04 0822) SpO2:  [97 %-100 %] 98 % (05/04 0822)   Constitutional: Cachectic Psych: Affect appropriate to situation Eyes: No scleral injection HENT: No OP obstrucion Head: Normocephalic.  Cardiovascular: Normal rate and regular rhythm.  Respiratory: Effort normal, non-labored breathing GI: Soft.  No distension. There is no tenderness.  Skin: WDI  Neuro: Mental Status: Patient is awake, alert to day, birthdate and [place] but is unable to identify the city, the month or year.  Speech is fluent with decreased information content. She is able to name a thumb but not an index finger. She is able to repeat a phrase with no difficulty. Patient is unable to give a clear and coherent history. She has some difficulty following complex commands.  Cranial Nerves: II: Visual Fields are full. PERRL.  III,IV, VI:  EOMI without ptosis or diplopia. Saccadic visual pursuits are noted. V: Facial sensation is symmetric to temperature VII: Facial movement is symmetric.  VIII: Hearing is intact to voice X: Palate elevates symmetrically XI: Shoulder shrug is symmetric. XII: Tongue is midline without atrophy or fasciculations.  Motor: Tone is normal. Bulk is normal. 5/5 strength was present in all four extremities.  However it is noticeable that she does have 4/5 strength with right shoulder extension.  She does have a drift in her right arm.  Her RUE is otherwise 5/5. No asterixis noted. Sensory: Sensation is symmetric to light touch and temperature in the arms and legs. Deep Tendon Reflexes: 1+ in biceps and brachioradialis bilaterally.  Due to patient's difficulty with cooperation, unable to get lower extremity deep tendon reflexes Plantars: Unable to obtain secondary  to patient's inability to understand what was being asked Cerebellar: No dysmetria noted with bilateral upper extremity finger-nose-finger; however, she did have a tremor bilaterally.  Labs I have reviewed labs in epic and the results pertinent to this consultation are:  CBC    Component Value Date/Time   WBC 7.9 05/19/2019 0409   RBC 2.99 (L) 05/19/2019 0409   HGB 9.2 (L) 05/19/2019 0409   HCT 26.8 (L) 05/19/2019 0409   PLT 538 (H) 05/19/2019 0409   MCV 89.6 05/19/2019 0409   MCH 30.8 05/19/2019 0409   MCHC 34.3 05/19/2019 0409   RDW 13.8 05/19/2019 0409   LYMPHSABS 1.2 05/18/2019 0941   MONOABS 0.9 05/18/2019 0941   EOSABS 0.0 05/18/2019 0941   BASOSABS 0.0 05/18/2019 0941    CMP     Component Value Date/Time   NA 131 (L) 05/18/2019 0941   K 3.1 (L) 05/18/2019 0941   CL 95 (L) 05/18/2019 0941   CO2 23 05/18/2019 0941   GLUCOSE 209 (H) 05/18/2019 0941   BUN 25 (H) 05/18/2019 0941   CREATININE 1.34 (H) 05/18/2019 2258   CALCIUM 8.0 (L) 05/18/2019 0941   PROT 5.7 (L) 05/18/2019 0941   ALBUMIN 1.7 (L) 05/18/2019 0941    AST 478 (H) 05/18/2019 0941   ALT 175 (H) 05/18/2019 0941   ALKPHOS 652 (H) 05/18/2019 0941   BILITOT 6.9 (H) 05/18/2019 0941   GFRNONAA 36 (L) 05/18/2019 2258   GFRAA 41 (L) 05/18/2019 2258    Imaging I have reviewed the images obtained:  CT-scan of the brain-no acute findings, atrophy and chronic small vessel ischemia are noted  Etta Quill PA-C [facility] [phone] 05/19/2019, 11:11 AM    Assessment:  86 year old female with transient left sided facial droop and dysarthria.   1. On exam today she no longer has a left facial droop, but right deltoid weakness is noted.  She does have mild dysarthria; however, she is edentulous.   2. CT is negative for acute abnormality, but could miss an acute stroke.  3. Given her stroke risk factors (DM, hypercholesterolemia and HTN), will obtain MRI to evaluate for possible stroke versus TIA.  Impression: -Transient dysarthria -Transient left facial droop - Probable mild dementia  Recommendations: -MRI of the brain without contrast -MRA head and neck  -Transthoracic Echo --Carotid ultrasound -Continue ASA  -BP goal: Modified permissive HTN protocol given advanced age. Treat if SBP is > 180. Continue for 24 hours.  -HBAIC and Lipid profile -Telemetry monitoring -Frequent neuro checks -NPO until passes stroke swallow screen -PT/OT # Please page stroke NP  Or  PA  Or MD from 8am -4 pm  as this patient from this time will be  followed by the stroke.   You can look them up on [email or web address]  Password [email or web address]  I have seen and examined the patient. I have formulated the assessment and recommendations. 86 year old female with transient left sided facial droop and slurred speech. Exam shows mild RUE weakness. DDx includes acute lacunar infarction. Stroke work up as above.  Electronically signed: Dr. Kerney Elbe

## 2019-05-19 NOTE — Progress Notes (Signed)
PT Cancellation Note  Patient Details Name: Debra [name] MRN: [id number] DOB: March 10, 1930   Cancelled Treatment:    Reason Eval/Treat Not Completed: Medical issues which prohibited therapy - pt pending workup for CVA, RN requests PT hold until tomorrow. Will check back then.  [place] Pager [phone]  Office [place] 05/19/2019, 2:39 PM

## 2019-05-19 NOTE — Progress Notes (Signed)
PROGRESS NOTE  THEODORE RAHRIG OZH:[id number] DOB: Nov 10, 1930   PCP: Asencion Noble, MD  Patient is from: Home.  Lives with her sons.  Uses cane and walker at baseline.  DOA: 05/18/2019 LOS: 1  Brief Narrative / Interim history: 86 year old female with history of DM-2, HTN and HLD presented to [facility] ED with slurred speech, left facial droop, jaundice and unintentional weight loss.  Reportedly had slurred speech and left facial droop the morning of presentation that lasted about 30 minutes.  Family also noticed jaundice.  Reportedly had about 30 pound weight loss over the last 1 year.  At [facility] ED, HR 101.  RR 27.  BP 103/67.  100% on RA.  Hgb 8.6 (10.4 in 2017).  Na 131.  K3.1. Cr 1.49 (2.34 in 2017).  BUN 25.  ALP 652.  Lipase 1132.  AST 478.  ALT 175.  Total bili 6.9.  Albumin 1.7. HS trop 30.  Lactic acid 4.5> 3.0.  Hgb 8.6.  PT/INR 15.3/1.3.  CT head without acute finding.  CT A/P with pancreatic duct and intra and extrahepatic biliary duct dilation and cholelithiasis.  Hospitalist service was requested for admission.  Family requested admission to [facility] for further care.  [facility] consulted. Patient was admitted for acute pancreatitis, transaminitis and CVA work-up.  She was started on IV Zosyn and IV fluid on admission.  The next day, patient had RUE weakness that was reported to be new per family.  Neurology consulted.  CVA work-up initiated.  Subjective: Seen and examined earlier this morning.  No major events overnight or this morning.  No complaints but patient does not have great insight into his situation.  She is oriented x2.  Her daughter thinks.  Right upper extremity weakness is new.  Patient denies headache, numbness, vision change, chest pain, dyspnea, nausea, vomiting, abdominal pain, diarrhea or UTI symptoms.  Objective: Vitals:   05/18/19 1800 05/18/19 1938 05/18/19 1958 05/19/19 0822  BP: (!) 105/43  (!) 106/54 (!) 103/58  Pulse: 93  96 93  Resp: 19  18 (!) 22    Temp:   98.7 F (37.1 C) 97.8 F (36.6 C)  TempSrc:   Oral Oral  SpO2: 100%  100% 98%  Weight:      Height:  5\' 7"  (1.702 m)      Intake/Output Summary (Last 24 hours) at 05/19/2019 1351 Last data filed at 05/19/2019 0603 Gross per 24 hour  Intake 300 ml  Output --  Net 300 ml   Filed Weights   05/18/19 0903  Weight: 65.8 kg    Examination:  GENERAL: No apparent distress.  Nontoxic. HEENT: MMM.  Sclera icteric. NECK: Supple.  No apparent JVD.  RESP: On room air.  No IWOB.  Fair aeration bilaterally. CVS:  RRR. Heart sounds normal.  ABD/GI/GU: Bowel sounds present. Soft. Non tender.  MSK/EXT:  Moves extremities. No apparent deformity. No edema.  SKIN: no apparent skin lesion or wound NEURO: Awake, alert and oriented to self and partial place.  CN grossly intact.  3+/5 RUE strength.  4+/5 elsewhere.  Biceps and patellar reflex symmetric.  Light sensation grossly intact. PSYCH: Calm. Normal affect.  Procedures:  None  Microbiology summarized: Influenza PCR negative. COVID-19 PCR negative. Urine culture with insignificant growth.  Assessment & Plan: Acute pancreatitis: Presenting with elevated lipase to 1162.  Surprisingly no GI symptoms.  She has obstructive jaundice with elevated liver enzymes, bilirubin and alkaline phosphatase.  CT with profound intra and extrahepatic biliary  duct dilation and slightly increased pancreatic duct dilation.  Concern about a small ampullary or pancreatic malignancy based on imaging and history of unintentional weight loss.  She also had cholelithiasis but no report of choledocholithiasis. -[facility] following -Ordered MRCP, CEA and CA 19-9 -Monitor lipase and liver enzymes -Continue IV Zosyn although infectious process seems to be unlikely at this point -Fractionate bilirubin -Will start clear liquid diet after RN swallow eval if okay from GI standpoint.  Obstructive jaundice: has hyperbilirubinemia to 6.9 .  -Fractionate bilirubin and  continue trending  Elevated liver enzymes/alkaline phosphatase/hyperbilirubinemia: Likely due to obstructive etiology. -MRCP as above -Check CK  RUE weakness/transient left facial droop/slurred speech -Stroke team consulted-appreciate recommendations -CVA work-up with MRI/MRA neck and head/ED course/A1c/lipid panel/PT/OT/SLP -RN swallow eval -Permissive hypertension per neurology -Aspirin 325 daily -Atorvastatin 80 mg daily. Will wait until LFT improves  DM-2 with hyperglycemia Recent Labs    05/18/19 2242 05/19/19 0824 05/19/19 1204  GLUCAP 149* 110* 103*  -Continue SSI with CBG monitoring -Check hemoglobin A1c and lipid panel   Essential hypertension: Soft blood pressures. -Permissive hypertension -IV NS at 100 cc an hour -Hold home medications.  Lactic acidosis: Suspect this to be due to dehydration and Metformin versus infectious process.  Resolved.  Hyponatremia: Likely due to HCTZ, hypovolemia and possible SIADH. -Change LR to normal saline -Urine chemistry, TSH and a.m. cortisol -Continue monitoring  Hypokalemia: Likely due to HCTZ -Replenish and recheck  Elevated creatinine: Unknown baseline.  Her creatinine as high as 2.34 in 2017. -Continue monitoring -IV fluid as above  Normocytic anemia: Hgb 10.4 in 2017> 8.6 (admit)> 9.5 -Check anemia panel -Continue monitoring  Debility/generalized weakness-uses cane and walker at baseline. -PT/OT eval  Goal of care: 86 year old female with comorbidities as above.  She is full code which I believe could pose more harm than benefit -Palliative care consult  Unintentional weight loss: family reports about 30 pounds weight loss in the last 1 year although her weight seems to be better over the last 1 months. Wt Readings from Last 10 Encounters:  05/18/19 65.8 kg  04/13/19 63.8 kg  01/18/16 88.5 kg  12/21/15 85.7 kg  04/27/15 88.9 kg  -Consult dietitian                 DVT prophylaxis: Subcu  heparin Code Status: Full code Family Communication: Updated patient's daughter at bedside.   Status is: Inpatient  Remains inpatient appropriate because:Ongoing diagnostic testing needed not appropriate for outpatient work up, IV treatments appropriate due to intensity of illness or inability to take PO and Inpatient level of care appropriate due to severity of illness.  Patient with acute pancreatitis, transaminitis and possible CVA   Dispo: The patient is from: Home              Anticipated d/c is to: To be determined              Anticipated d/c date is: 2 days              Patient currently is not medically stable to d/c.    Consultants:  GI Neurology   Sch Meds:  Scheduled Meds: . aspirin EC  81 mg Oral Daily  . heparin  5,000 Units Subcutaneous Q8H  . insulin aspart  0-5 Units Subcutaneous QHS  . insulin aspart  0-9 Units Subcutaneous TID WC  . latanoprost  1 drop Both Eyes QHS   Continuous Infusions: . lactated ringers with kcl 100 mL/hr at 05/18/19  1739  . piperacillin-tazobactam (ZOSYN)  IV 3.375 g (05/19/19 1340)   PRN Meds:.ondansetron **OR** ondansetron (ZOFRAN) IV  Antimicrobials: Anti-infectives (From admission, onward)   Start     Dose/Rate Route Frequency Ordered Stop   05/18/19 2200  piperacillin-tazobactam (ZOSYN) IVPB 3.375 g     3.375 g 12.5 mL/hr over 240 Minutes Intravenous Every 8 hours 05/18/19 2049     05/18/19 1015  cefTRIAXone (ROCEPHIN) 1 g in sodium chloride 0.9 % 100 mL IVPB     1 g 200 mL/hr over 30 Minutes Intravenous  Once 05/18/19 1008 05/18/19 1226       I have personally reviewed the following labs and images: CBC: Recent Labs  Lab 05/18/19 0941 05/18/19 2258 05/19/19 0409  WBC 8.2 8.4 7.9  NEUTROABS 6.1  --   --   HGB 8.6* 9.5* 9.2*  HCT 26.1* 28.0* 26.8*  MCV 92.6 90.3 89.6  PLT 499* 601* 538*   BMP &GFR Recent Labs  Lab 05/18/19 0941 05/18/19 2258 05/19/19 0409  NA 131*  --  131*  K 3.1*  --  3.2*  CL 95*   --  100  CO2 23  --  19*  GLUCOSE 209*  --  119*  BUN 25*  --  15  CREATININE 1.49* 1.34* 1.41*  CALCIUM 8.0*  --  8.0*  MG 1.7  --   --    Estimated Creatinine Clearance: 27.3 mL/min (A) (by C-G formula based on SCr of 1.41 mg/dL (H)). Liver & Pancreas: Recent Labs  Lab 05/18/19 0941 05/19/19 0409  AST 478* 463*  ALT 175* 179*  ALKPHOS 652* 607*  BILITOT 6.9* 7.4*  PROT 5.7* 6.0*  ALBUMIN 1.7* 1.5*   Recent Labs  Lab 05/18/19 0941 05/19/19 0409  LIPASE 1,132* 996*   No results for input(s): AMMONIA in the last 168 hours. Diabetic: Recent Labs    05/18/19 2258  HGBA1C 6.1*   Recent Labs  Lab 05/18/19 2242 05/19/19 0824 05/19/19 1204  GLUCAP 149* 110* 103*   Cardiac Enzymes: No results for input(s): CKTOTAL, CKMB, CKMBINDEX, TROPONINI in the last 168 hours. No results for input(s): PROBNP in the last 8760 hours. Coagulation Profile: Recent Labs  Lab 05/18/19 0941 05/19/19 0409  INR 1.3* 1.2   Thyroid Function Tests: No results for input(s): TSH, T4TOTAL, FREET4, T3FREE, THYROIDAB in the last 72 hours. Lipid Profile: No results for input(s): CHOL, HDL, LDLCALC, TRIG, CHOLHDL, LDLDIRECT in the last 72 hours. Anemia Panel: No results for input(s): VITAMINB12, FOLATE, FERRITIN, TIBC, IRON, RETICCTPCT in the last 72 hours. Urine analysis:    Component Value Date/Time   COLORURINE AMBER (A) 05/18/2019 1046   APPEARANCEUR HAZY (A) 05/18/2019 1046   LABSPEC 1.012 05/18/2019 1046   PHURINE 5.0 05/18/2019 1046   GLUCOSEU NEGATIVE 05/18/2019 1046   HGBUR LARGE (A) 05/18/2019 1046   BILIRUBINUR SMALL (A) 05/18/2019 1046   KETONESUR NEGATIVE 05/18/2019 1046   PROTEINUR 30 (A) 05/18/2019 1046   NITRITE NEGATIVE 05/18/2019 1046   LEUKOCYTESUR NEGATIVE 05/18/2019 1046   Sepsis Labs: Invalid input(s): PROCALCITONIN, [place]  Microbiology: Recent Results (from the past 240 hour(s))  Respiratory Panel by RT PCR (Flu A&B, Covid) - Urine, Catheterized      Status: None   Collection Time: 05/18/19 10:47 AM   Specimen: Urine, Catheterized  Result Value Ref Range Status   SARS Coronavirus 2 by RT PCR NEGATIVE NEGATIVE Final    Comment: (NOTE) SARS-CoV-2 target nucleic acids are NOT DETECTED. The SARS-CoV-2 RNA is  generally detectable in upper respiratoy specimens during the acute phase of infection. The lowest concentration of SARS-CoV-2 viral copies this assay can detect is 131 copies/mL. A negative result does not preclude SARS-Cov-2 infection and should not be used as the sole basis for treatment or other patient management decisions. A negative result may occur with  improper specimen collection/handling, submission of specimen other than nasopharyngeal swab, presence of viral mutation(s) within the areas targeted by this assay, and inadequate number of viral copies (<131 copies/mL). A negative result must be combined with clinical observations, patient history, and epidemiological information. The expected result is Negative. Fact Sheet for Patients:  PinkCheek.be Fact Sheet for Healthcare Providers:  GravelBags.it This test is not yet ap proved or cleared by the [place] FDA and  has been authorized for detection and/or diagnosis of SARS-CoV-2 by FDA under an Emergency Use Authorization (EUA). This EUA will remain  in effect (meaning this test can be used) for the duration of the COVID-19 declaration under Section 564(b)(1) of the Act, 21 U.S.C. section 360bbb-3(b)(1), unless the authorization is terminated or revoked sooner.    Influenza A by PCR NEGATIVE NEGATIVE Final   Influenza B by PCR NEGATIVE NEGATIVE Final    Comment: (NOTE) The Xpert Xpress SARS-CoV-2/FLU/RSV assay is intended as an aid in  the diagnosis of influenza from Nasopharyngeal swab specimens and  should not be used as a sole basis for treatment. Nasal washings and  aspirates are unacceptable for  Xpert Xpress SARS-CoV-2/FLU/RSV  testing. Fact Sheet for Patients: PinkCheek.be Fact Sheet for Healthcare Providers: GravelBags.it This test is not yet approved or cleared by the [place] FDA and  has been authorized for detection and/or diagnosis of SARS-CoV-2 by  FDA under an Emergency Use Authorization (EUA). This EUA will remain  in effect (meaning this test can be used) for the duration of the  Covid-19 declaration under Section 564(b)(1) of the Act, 21  U.S.C. section 360bbb-3(b)(1), unless the authorization is  terminated or revoked. Performed at [facility], [address] [place], [place] [address]   Urine Culture     Status: Abnormal   Collection Time: 05/18/19 11:07 AM   Specimen: Urine, Clean Catch  Result Value Ref Range Status   Specimen Description   Final    URINE, CLEAN CATCH Performed at [facility], [address], [place], [place] [address]    Special Requests   Final    NONE Performed at [facility], [address], [place], [place] [address]    Culture (A)  Final    <10,000 COLONIES/mL INSIGNIFICANT GROWTH Performed at [place] [address], [place],  [address]    Report Status 05/19/2019 FINAL  Final    Radiology Studies: No results found.  55 minutes with more than 50% spent in reviewing records, counseling patient/family and coordinating care.   Nikkita Adeyemi T. [place]  If 7PM-7AM, please contact night-coverage [email or web address] Password [email or web address] 05/19/2019, 1:51 PM

## 2019-05-19 NOTE — Progress Notes (Signed)
Initial Nutrition Assessment  DOCUMENTATION CODES:   Severe malnutrition in context of chronic illness  INTERVENTION:   - Boost Breeze po TID, each supplement provides 250 kcal and 9 grams of protein  - Once diet advanced, will adjust supplement regimen  - Once diet advanced, recommend dysphagia 3 diet for ease of chewing given pt is edentulous  NUTRITION DIAGNOSIS:   Severe Malnutrition related to chronic illness as evidenced by severe fat depletion, severe muscle depletion, moderate fat depletion, moderate muscle depletion.  GOAL:   Patient will meet greater than or equal to 90% of their needs  MONITOR:   PO intake, Supplement acceptance, Diet advancement, Labs, Weight trends, I & O's  REASON FOR ASSESSMENT:   Malnutrition Screening Tool    ASSESSMENT:   86 year old female who presented on 5/03 with slurred speech. PMH of HTN, DM, HLD. Pt admitted with cholelithiasis, pancreatitis.   GI has scheduled ERCP + EUS for 05/20/19. Concern is for pancreatic or ampullary malignancy.  Spoke with pt and daughter at bedside. Pt endorses a decreased appetite over the last year. Pt states that she get full quickly at mealtimes. Pt states that she still eats 3 meals daily but eats much less at mealtimes. Pt's daughter confirms.  Pt reports that her UBW is around 200 lbs and that she last weighed this 1 year ago. Pt believes she now weighs around 140 lbs.  Reviewed weight history in chart. Pt with a 22.7 kg weight loss over the last 3 years. This is a 25.6% weight loss which is not significant for 3 year time period but would be significant for a 1 year time period. However, limited weights are available.  Pt denies any issues swallowing but states that she has issues chewing due to not having any teeth. Pt does not have dentures. Pt requests mechanical chopped diet once she is cleared for solid foods.  Pt states that she does drink supplements and maybe has 1 a day. Pt is willing to  consume oral nutrition supplements during admission. RD will order Boost Breeze while pt is on clear liquid diet and monitor for diet advancement.  Medications reviewed and include: SSI, IV abx, LR with KCl @ 100 ml/hr  Labs reviewed: hemoglobin 9.2, lipase 996, sodium 131, potassium 3.1, chloride 95, elevated LFTs CBG's: 110-149  NUTRITION - FOCUSED PHYSICAL EXAM:    Most Recent Value  Orbital Region  Severe depletion  Upper Arm Region  Moderate depletion  Thoracic and Lumbar Region  Moderate depletion  Buccal Region  Severe depletion  Temple Region  Severe depletion  Clavicle Bone Region  Severe depletion  Clavicle and Acromion Bone Region  Severe depletion  Scapular Bone Region  Moderate depletion  Dorsal Hand  Moderate depletion  Patellar Region  Moderate depletion  Anterior Thigh Region  Severe depletion  Posterior Calf Region  Moderate depletion  Edema (RD Assessment)  None  Hair  Reviewed  Eyes  Reviewed  Mouth  Reviewed  Skin  Reviewed  Nails  Reviewed       Diet Order:   Diet Order            Diet NPO time specified  Diet effective midnight        Diet clear liquid Room service appropriate? Yes; Fluid consistency: Thin  Diet effective now              EDUCATION NEEDS:   No education needs have been identified at this time  Skin:  Skin Assessment:  Reviewed RN Assessment  Last BM:  05/19/19 small type 1  Height:   Ht Readings from Last 1 Encounters:  05/18/19 5\' 7"  (1.702 m)    Weight:   Wt Readings from Last 1 Encounters:  05/18/19 65.8 kg    Ideal Body Weight:  61.4 kg  BMI:  Body mass index is 22.71 kg/m.  Estimated Nutritional Needs:   Kcal:  1500-1700  Protein:  75-90 grams  Fluid:  >/= 1.5 L    Gaynell Face, MS, RD, LDN Inpatient Clinical Dietitian Pager: [phone] Weekend/After Hours: [phone]

## 2019-05-19 NOTE — Consult Note (Signed)
Referring Provider: Dr. Wendee Beavers Primary Care Physician:  Asencion Noble, MD Primary Gastroenterologist:  Dr. Gala Romney   Reason for Consultation:  Painless jaundice   HPI: Debra [name] is a 86 y.o. female with past medical history of hypertension, hypercholesterolemia, diabetes, CKD,  and memory loss. She lives with her son. He noticed she had a left sided facial droop and she was slurring her words on 5/3. He also notice she appeared jaundiced. No associated N/V or abdominal pain. She presented to [facility] ED 05/18/2019 for further evaluation. She was evaluated by neurology who suggested adding Plavix to ASA for a possible TIA. An abd/pelvic CT scan showed  profound intra and extrahepatic biliary duct dilatation and dilation of the pancreatic duct which was noted to be increased since prior imaging done 03/20/2019.  LFTs were markedly elevated assistant with acute cholestasis.  She was transferred to [facility] for further evaluation.   Her daughter Debra [name] is at the bedside and she is facilitating obtaining the patient's history.  She denies having any nausea or vomiting.  No upper or lower abdominal pain.  She is passing normal formed brown-colored stools daily.  She has had constipation intermittently.  She takes a stool softener as needed.  No rectal bleeding or melena.  She takes aspirin 81 mg daily.  No other NSAIDs.  She reported losing 55 pounds over the past 3 years.  No fever, sweats or chills. She is followed by Dr. Gala Romney at [facility]. She was last seen at Dr. Roseanne Kaufman office by Neil Crouch, PA-C on 04/13/2019 for further evaluation regarding an abd/pelvic CT scan  03/20/2019 which showed dilated gallbladder with cholelithiasis and mild wall thickening suspicious for cholecystitis.  Diffuse biliary ductal dilatation with common bile duct measuring 14 mm was noted.  A MRCP was recommended, however, due to the patient's dementia her family did not assess she would tolerate or cooperate for this  test.  Details of any further GI work-up by Dr Gala Romney is unclear. She denies ever having an EGD or colonoscopy.  No family history of liver, pancreatic or colorectal cancer.   ED Course 05/18/2019: Na+ 131.  Potassium 3.1.  Glucose 209.  BUN 25.  Creatinine 1.49.  Magnesium 1.7.  Alk phos 652.  Albumin 1.7.  Lipase 1132.  AST 478.  ALT 175.  Total bili 6.9.  Troponin 30.  Lactic acid 4.5.  WBC 8.2.  Hemoglobin 8.6.  Hematocrit 26.1.  MCV 92.6.  Platelet 499.  Abdominal/pelvic CT with contrast 05/18/2019:  1. 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 2.  Cholelithiasis. 3.  Coronary artery disease.  Aortic Atherosclerosis    Past Medical History:  Diagnosis Date  . CKD (chronic kidney disease)   . Diabetes mellitus without complication ([place])   . Gout   . Hypercholesteremia   . Hypertension     Past Surgical History:  Procedure Laterality Date  . TOTAL KNEE ARTHROPLASTY Left     Prior to Admission medications   Medication Sig Start Date End Date Taking? Authorizing Provider  amLODipine (NORVASC) 10 MG tablet Take 10 mg by mouth daily.   Yes [provider]  ammonium lactate (AMLACTIN) 12 % cream Apply 1 application topically 2 (two) times daily as needed. 01/26/19  Yes [provider]  aspirin EC 81 MG tablet Take  81 mg by mouth daily.   Yes [provider]  atorvastatin (LIPITOR) 80 MG tablet Take 80 mg by mouth daily at 6 PM.   Yes [provider]  colchicine 0.6 MG tablet Take 0.6 mg by mouth daily as needed.    Yes [provider]  docusate sodium (COLACE) 100 MG capsule Take 100 mg by mouth daily.   Yes [provider]  hydrochlorothiazide (HYDRODIURIL) 25 MG tablet Take 25 mg by mouth daily.  05/14/19  Yes [provider]  labetalol (NORMODYNE) 100 MG tablet Take 100 mg by mouth 2 (two) times daily.   Yes [provider]  latanoprost (XALATAN) 0.005 % ophthalmic solution Place 1 drop into both eyes at bedtime.    Yes [provider]  losartan (COZAAR) 100 MG tablet Take 100 mg by mouth daily. 05/14/19  Yes [provider]  meclizine (ANTIVERT) 25 MG tablet Take 25 mg by mouth 3 (three) times daily as needed for dizziness.   Yes [provider]  metFORMIN (GLUCOPHAGE-XR) 500 MG 24 hr tablet Take 500 mg by mouth 2 (two) times daily.   Yes [provider]  triamcinolone (KENALOG) 0.025 % cream Apply 1 application topically 2 (two) times daily as needed. 03/23/19  Yes [provider]    Current Facility-Administered Medications  Medication Dose Route Frequency Provider Last Rate Last Admin  . aspirin EC tablet 81 mg  81 mg Oral Daily Kathie Dike, MD   Stopped at 05/19/19 1030  . heparin injection 5,000 Units  5,000 Units Subcutaneous Q8H Kathie Dike, MD   5,000 Units at 05/19/19 0604  . insulin aspart (novoLOG) injection 0-5 Units  0-5 Units Subcutaneous QHS Memon, Jolaine Artist, MD      . insulin aspart (novoLOG) injection 0-9 Units  0-9 Units Subcutaneous TID WC Memon, Jehanzeb, MD      . lactated ringers 1,000 mL with potassium chloride 20 mEq infusion   Intravenous Continuous Kathie Dike, MD 100 mL/hr at 05/18/19 1739 New Bag at 05/18/19 1739  . latanoprost (XALATAN) 0.005 % ophthalmic solution 1 drop  1 drop Both Eyes QHS Kathie Dike, MD   1 drop at 05/18/19 2322  . ondansetron (ZOFRAN) tablet 4 mg  4 mg Oral Q6H PRN Kathie Dike, MD       Or  . ondansetron (ZOFRAN) injection 4 mg  4 mg Intravenous Q6H PRN Kathie Dike, MD      . piperacillin-tazobactam (ZOSYN) IVPB 3.375 g  3.375 g Intravenous Q8H Kathie Dike, MD 12.5 mL/hr at 05/19/19 0603 3.375 g at 05/19/19 0603    Allergies as of 05/18/2019 -  Review Complete 05/18/2019  Allergen Reaction Noted  . Other Nausea And Vomiting 04/27/2015    Family History  Problem Relation Age of Onset  . Diabetes Mother   . Hypertension Father   . Breast cancer Niece   . Colon cancer Neg Hx     Social History   Socioeconomic History  . Marital status: Single    Spouse name: Not on file  . Number of children: Not on file  . Years of education: Not on file  . Highest education level: Not on file  Occupational History  . Not on file  Tobacco Use  . Smoking status: Never Smoker  . Smokeless tobacco: Never Used  Substance and Sexual Activity  . Alcohol use: No  . Drug use: No  . Sexual activity: Not on file  Other Topics Concern  . Not on file  Social History Narrative  . Not on file   Social Determinants of Health   Financial Resource Strain:   . Difficulty of Paying Living Expenses:   Food Insecurity:   . Worried About Charity fundraiser in the Last Year:   . Arboriculturist in the Last Year:   Transportation Needs:   . Film/video editor (Medical):   Marland Kitchen Lack of Transportation (Non-Medical):   Physical Activity:   . Days of Exercise per Week:   . Minutes of Exercise per Session:   Stress:   . Feeling of Stress :   Social Connections:   . Frequency of Communication with Friends and Family:   . Frequency of Social Gatherings with Friends and Family:   . Attends Religious Services:   . Active Member of Clubs or Organizations:   . Attends Archivist Meetings:   Marland Kitchen Marital Status:   Intimate Partner Violence:   . Fear of Current or Ex-Partner:   . Emotionally Abused:   Marland Kitchen Physically Abused:   . Sexually Abused:     Review of Systems: See HPI, all other systems reviewed and are negative  Physical Exam: Vital signs in last 24 hours: Temp:  [97.8 F (36.6 C)-98.7 F (37.1 C)] 97.8 F (36.6 C) (05/04 0822) Pulse Rate:  [90-104] 93 (05/04 0822) Resp:  [18-27] 22 (05/04 0822) BP: (99-111)/(43-67) 103/58  (05/04 0822) SpO2:  [97 %-100 %] 98 % (05/04 0822) Last BM Date: 05/18/19 General: 86 year old female alert and  cooperative in NAD. Head:  Normocephalic and atraumatic. Eyes:  No scleral icterus. Conjunctiva pink. Ears:  Normal auditory acuity. Nose:  No deformity, discharge or lesions. Mouth: Absent dentition.  No ulcers or lesions.  Neck:  Supple. No lymphadenopathy or thyromegaly.  Lungs: Breath sounds clear throughout. Heart: Regular rate and rhythm, no murmurs. Abdomen: Soft, somewhat protuberant but not distended.  Positive bowel sounds all 4 quadrants.  Small subcutaneous knot to the right mid abdomen.  No other masses.  No hepatosplenomegaly. Rectal: Deferred. Musculoskeletal:  Symmetrical without gross deformities.  Pulses:  Normal pulses noted. Extremities:  Without clubbing or edema. Neurologic:  Alert and  oriented x 3. No focal deficits.  Skin:  Intact without significant lesions or rashes. Psych:  Alert and cooperative. Normal mood and affect.  Intake/Output from previous day: 05/03 0701 - 05/04 0700 In: 2398 [IV Piggyback:2398] Out: -  Intake/Output this shift: No intake/output data recorded.  Lab Results: Recent Labs    05/18/19 0941 05/18/19 2258 05/19/19 0409  WBC 8.2 8.4 7.9  HGB 8.6* 9.5* 9.2*  HCT 26.1* 28.0* 26.8*  PLT 499* 601* 538*   BMET Recent Labs    05/18/19 0941 05/18/19 2258 05/19/19 0409  NA 131*  --  131*  K 3.1*  --  3.2*  CL 95*  --  100  CO2 23  --  19*  GLUCOSE 209*  --  119*  BUN 25*  --  15  CREATININE 1.49* 1.34* 1.41*  CALCIUM 8.0*  --  8.0*   LFT Recent Labs    05/19/19 0409  PROT 6.0*  ALBUMIN 1.5*  AST 463*  ALT 179*  ALKPHOS 607*  BILITOT 7.4*   PT/INR Recent Labs    05/18/19 0941 05/19/19 0409  LABPROT 15.3* 14.4  INR 1.3* 1.2   Hepatitis Panel No results for input(s): HEPBSAG, HCVAB, HEPAIGM, HEPBIGM in the last 72 hours.    Studies/Results: CT Head Wo Contrast  Result Date:  05/18/2019 CLINICAL DATA:  Ataxia with stroke suspected EXAM: CT HEAD WITHOUT CONTRAST TECHNIQUE: Contiguous axial images were obtained from the base of the skull through the vertex without intravenous contrast. COMPARISON:  02/27/2019 FINDINGS: Brain: No evidence of acute infarction, hemorrhage, hydrocephalus, extra-axial collection or mass lesion/mass effect. Chronic small vessel ischemia in the periventricular white matter. Cerebral volume loss with ventriculomegaly Vascular: No hyperdense vessel or unexpected calcification. Skull: Normal. Negative for fracture or focal lesion. Sinuses/Orbits: No acute finding. IMPRESSION: 1. No acute finding. 2. Atrophy and chronic small vessel ischemia. Electronically Signed   By: Monte Fantasia M.D.   On: 05/18/2019 10:37   CT ABDOMEN PELVIS W CONTRAST  Result Date: 05/18/2019 CLINICAL DATA:  Abdominal pain, neutropenia, elevated LFTs EXAM: CT ABDOMEN AND PELVIS WITH CONTRAST TECHNIQUE: Multidetector CT imaging of the abdomen and pelvis was performed using the standard protocol following bolus administration of intravenous contrast. CONTRAST:  29m OMNIPAQUE IOHEXOL 300 MG/ML  SOLN COMPARISON:  03/20/2019 FINDINGS: Lower chest: No acute abnormality. Three-vessel coronary artery calcifications. Hepatobiliary: Redemonstrated profound intra and extrahepatic biliary ductal dilatation to the ampulla, which is increased compared prior examination, the common bile duct measuring up to 1.6 cm. The gallbladder is distended and contains numerous tiny gallstones. Pancreas: The pancreatic duct is profoundly dilated along its length to the ampulla, measuring up to 1.4 cm centrally. Spleen: Normal in size without significant abnormality. Adrenals/Urinary Tract: Adrenal glands are unremarkable. Kidneys are normal, without renal calculi, solid lesion, or hydronephrosis. Bladder is unremarkable. Stomach/Bowel: Stomach is within normal limits. Appendix appears normal. No evidence of bowel  wall thickening, distention, or inflammatory changes. Vascular/Lymphatic: Aortic atherosclerosis. No enlarged abdominal or pelvic lymph nodes. Reproductive: No mass or other significant abnormality. Other: No abdominal wall hernia or abnormality. No abdominopelvic ascites. Musculoskeletal: No acute or significant osseous findings. IMPRESSION: 1. 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 2.  Cholelithiasis. 3.  Coronary artery disease.  Aortic Atherosclerosis (ICD10-I70.0). Electronically Signed   By: AEddie CandleM.D.   On: 05/18/2019 11:15   DG Chest Port 1 View  Result Date: 05/18/2019 CLINICAL DATA:  Weakness and slurred speech. EXAM: PORTABLE CHEST 1 VIEW COMPARISON:  01/28/2017. FINDINGS: Trachea is midline. Heart size normal. Thoracic aorta is calcified. Lungs are clear. No pleural fluid. IMPRESSION: 1. No acute findings. 2.  Aortic atherosclerosis (ICD10-I70.0). Electronically Signed   By: MLorin PicketM.D.   On: 05/18/2019 10:26    IMPRESSION/PLAN:  166 86year old female with painless jaundice. Elevated LFTs. CTAP 5/3 showed profound intra and extrahepatic biliary ductal dilatation in addition to dilatation of the pancreatic duct, highly concerning for a small ampullary or pancreatic malignancy. Cholelithiasis. Alk phos 652 -> 607. AST 478 -> 463.  ALT 175 -179.  T. bii 6.9 -> 7.4.  Lipase 1132-> 996. She is afebrile. HR 90 - 100's. BP 90/100's/50's.  -She will most likely require ERCP +/- EUS, await further recommendations per Dr. BTarri Glenn The patient wishes to purse any required endoscopic procedure. ERCP/EUS benefits and risk discussed including the risks with general anesthesia, risk of bleeding, perforation, infection and pancreatitis. -Continue LR @  100cc/hr -Ondansetron 428mIV or po PRN Q 6hrs -Continue Zosyn 3.37566mIV Q 8 hrs  -NPO  2. Normocytic anemia, dilutional component.  Hg 9.5 -> 9.2 -> 8.4 -> 7.9.  HCT 26.9. MCV 89.6. FOBT  5/3 negative. No obvious signs of active GI bleeding.  3. DM II   Further recommendations per Dr. Adline Peals Dorathy Daft  05/19/2019, 12:02 PM

## 2019-05-19 NOTE — Plan of Care (Signed)
Neurology was asked by GI for clearance to allow patient to undergo ERCP and EUS.  Discussed with Dr. Cheral Marker.  Ok from neurology stand point to proceed.   Etta Quill PA-C [facility] [phone]  M-F  (9:00 am- 5:00 PM)  05/19/2019, 2:53 PM

## 2019-05-19 NOTE — Progress Notes (Signed)
  Echocardiogram 2D Echocardiogram has been performed.  Debra [name] 05/19/2019, 4:00 PM

## 2019-05-19 NOTE — H&P (View-Only) (Signed)
Referring Provider: Dr. Wendee Beavers Primary Care Physician:  Asencion Noble, MD Primary Gastroenterologist:  Dr. Gala Romney   Reason for Consultation:  Painless jaundice   HPI: Debra [name] is a 86 y.o. female with past medical history of hypertension, hypercholesterolemia, diabetes, CKD,  and memory loss. She lives with her son. He noticed she had a left sided facial droop and she was slurring her words on 5/3. He also notice she appeared jaundiced. No associated N/V or abdominal pain. She presented to [facility] ED 05/18/2019 for further evaluation. She was evaluated by neurology who suggested adding Plavix to ASA for a possible TIA. An abd/pelvic CT scan showed  profound intra and extrahepatic biliary duct dilatation and dilation of the pancreatic duct which was noted to be increased since prior imaging done 03/20/2019.  LFTs were markedly elevated assistant with acute cholestasis.  She was transferred to [facility] for further evaluation.   Her daughter Debra [name] is at the bedside and she is facilitating obtaining the patient's history.  She denies having any nausea or vomiting.  No upper or lower abdominal pain.  She is passing normal formed brown-colored stools daily.  She has had constipation intermittently.  She takes a stool softener as needed.  No rectal bleeding or melena.  She takes aspirin 81 mg daily.  No other NSAIDs.  She reported losing 55 pounds over the past 3 years.  No fever, sweats or chills. She is followed by Dr. Gala Romney at [facility]. She was last seen at Dr. Roseanne Kaufman office by Neil Crouch, PA-C on 04/13/2019 for further evaluation regarding an abd/pelvic CT scan  03/20/2019 which showed dilated gallbladder with cholelithiasis and mild wall thickening suspicious for cholecystitis.  Diffuse biliary ductal dilatation with common bile duct measuring 14 mm was noted.  A MRCP was recommended, however, due to the patient's dementia her family did not assess she would tolerate or cooperate for this  test.  Details of any further GI work-up by Dr Gala Romney is unclear. She denies ever having an EGD or colonoscopy.  No family history of liver, pancreatic or colorectal cancer.   ED Course 05/18/2019: Na+ 131.  Potassium 3.1.  Glucose 209.  BUN 25.  Creatinine 1.49.  Magnesium 1.7.  Alk phos 652.  Albumin 1.7.  Lipase 1132.  AST 478.  ALT 175.  Total bili 6.9.  Troponin 30.  Lactic acid 4.5.  WBC 8.2.  Hemoglobin 8.6.  Hematocrit 26.1.  MCV 92.6.  Platelet 499.  Abdominal/pelvic CT with contrast 05/18/2019:  1. 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 2.  Cholelithiasis. 3.  Coronary artery disease.  Aortic Atherosclerosis    Past Medical History:  Diagnosis Date  . CKD (chronic kidney disease)   . Diabetes mellitus without complication ([place])   . Gout   . Hypercholesteremia   . Hypertension     Past Surgical History:  Procedure Laterality Date  . TOTAL KNEE ARTHROPLASTY Left     Prior to Admission medications   Medication Sig Start Date End Date Taking? Authorizing Provider  amLODipine (NORVASC) 10 MG tablet Take 10 mg by mouth daily.   Yes [provider]  ammonium lactate (AMLACTIN) 12 % cream Apply 1 application topically 2 (two) times daily as needed. 01/26/19  Yes [provider]  aspirin EC 81 MG tablet Take  81 mg by mouth daily.   Yes [provider]  atorvastatin (LIPITOR) 80 MG tablet Take 80 mg by mouth daily at 6 PM.   Yes [provider]  colchicine 0.6 MG tablet Take 0.6 mg by mouth daily as needed.    Yes [provider]  docusate sodium (COLACE) 100 MG capsule Take 100 mg by mouth daily.   Yes [provider]  hydrochlorothiazide (HYDRODIURIL) 25 MG tablet Take 25 mg by mouth daily.  05/14/19  Yes [provider]  labetalol (NORMODYNE) 100 MG tablet Take 100 mg by mouth 2 (two) times daily.   Yes [provider]  latanoprost (XALATAN) 0.005 % ophthalmic solution Place 1 drop into both eyes at bedtime.    Yes [provider]  losartan (COZAAR) 100 MG tablet Take 100 mg by mouth daily. 05/14/19  Yes [provider]  meclizine (ANTIVERT) 25 MG tablet Take 25 mg by mouth 3 (three) times daily as needed for dizziness.   Yes [provider]  metFORMIN (GLUCOPHAGE-XR) 500 MG 24 hr tablet Take 500 mg by mouth 2 (two) times daily.   Yes [provider]  triamcinolone (KENALOG) 0.025 % cream Apply 1 application topically 2 (two) times daily as needed. 03/23/19  Yes [provider]    Current Facility-Administered Medications  Medication Dose Route Frequency Provider Last Rate Last Admin  . aspirin EC tablet 81 mg  81 mg Oral Daily Kathie Dike, MD   Stopped at 05/19/19 1030  . heparin injection 5,000 Units  5,000 Units Subcutaneous Q8H Kathie Dike, MD   5,000 Units at 05/19/19 0604  . insulin aspart (novoLOG) injection 0-5 Units  0-5 Units Subcutaneous QHS Memon, Jolaine Artist, MD      . insulin aspart (novoLOG) injection 0-9 Units  0-9 Units Subcutaneous TID WC Memon, Jehanzeb, MD      . lactated ringers 1,000 mL with potassium chloride 20 mEq infusion   Intravenous Continuous Kathie Dike, MD 100 mL/hr at 05/18/19 1739 New Bag at 05/18/19 1739  . latanoprost (XALATAN) 0.005 % ophthalmic solution 1 drop  1 drop Both Eyes QHS Kathie Dike, MD   1 drop at 05/18/19 2322  . ondansetron (ZOFRAN) tablet 4 mg  4 mg Oral Q6H PRN Kathie Dike, MD       Or  . ondansetron (ZOFRAN) injection 4 mg  4 mg Intravenous Q6H PRN Kathie Dike, MD      . piperacillin-tazobactam (ZOSYN) IVPB 3.375 g  3.375 g Intravenous Q8H Kathie Dike, MD 12.5 mL/hr at 05/19/19 0603 3.375 g at 05/19/19 0603    Allergies as of 05/18/2019 -  Review Complete 05/18/2019  Allergen Reaction Noted  . Other Nausea And Vomiting 04/27/2015    Family History  Problem Relation Age of Onset  . Diabetes Mother   . Hypertension Father   . Breast cancer Niece   . Colon cancer Neg Hx     Social History   Socioeconomic History  . Marital status: Single    Spouse name: Not on file  . Number of children: Not on file  . Years of education: Not on file  . Highest education level: Not on file  Occupational History  . Not on file  Tobacco Use  . Smoking status: Never Smoker  . Smokeless tobacco: Never Used  Substance and Sexual Activity  . Alcohol use: No  . Drug use: No  . Sexual activity: Not on file  Other Topics Concern  . Not on file  Social History Narrative  . Not on file   Social Determinants of Health   Financial Resource Strain:   . Difficulty of Paying Living Expenses:   Food Insecurity:   . Worried About Charity fundraiser in the Last Year:   . Arboriculturist in the Last Year:   Transportation Needs:   . Film/video editor (Medical):   Marland Kitchen Lack of Transportation (Non-Medical):   Physical Activity:   . Days of Exercise per Week:   . Minutes of Exercise per Session:   Stress:   . Feeling of Stress :   Social Connections:   . Frequency of Communication with Friends and Family:   . Frequency of Social Gatherings with Friends and Family:   . Attends Religious Services:   . Active Member of Clubs or Organizations:   . Attends Archivist Meetings:   Marland Kitchen Marital Status:   Intimate Partner Violence:   . Fear of Current or Ex-Partner:   . Emotionally Abused:   Marland Kitchen Physically Abused:   . Sexually Abused:     Review of Systems: See HPI, all other systems reviewed and are negative  Physical Exam: Vital signs in last 24 hours: Temp:  [97.8 F (36.6 C)-98.7 F (37.1 C)] 97.8 F (36.6 C) (05/04 0822) Pulse Rate:  [90-104] 93 (05/04 0822) Resp:  [18-27] 22 (05/04 0822) BP: (99-111)/(43-67) 103/58  (05/04 0822) SpO2:  [97 %-100 %] 98 % (05/04 0822) Last BM Date: 05/18/19 General: 86 year old female alert and  cooperative in NAD. Head:  Normocephalic and atraumatic. Eyes:  No scleral icterus. Conjunctiva pink. Ears:  Normal auditory acuity. Nose:  No deformity, discharge or lesions. Mouth: Absent dentition.  No ulcers or lesions.  Neck:  Supple. No lymphadenopathy or thyromegaly.  Lungs: Breath sounds clear throughout. Heart: Regular rate and rhythm, no murmurs. Abdomen: Soft, somewhat protuberant but not distended.  Positive bowel sounds all 4 quadrants.  Small subcutaneous knot to the right mid abdomen.  No other masses.  No hepatosplenomegaly. Rectal: Deferred. Musculoskeletal:  Symmetrical without gross deformities.  Pulses:  Normal pulses noted. Extremities:  Without clubbing or edema. Neurologic:  Alert and  oriented x 3. No focal deficits.  Skin:  Intact without significant lesions or rashes. Psych:  Alert and cooperative. Normal mood and affect.  Intake/Output from previous day: 05/03 0701 - 05/04 0700 In: 2398 [IV Piggyback:2398] Out: -  Intake/Output this shift: No intake/output data recorded.  Lab Results: Recent Labs    05/18/19 0941 05/18/19 2258 05/19/19 0409  WBC 8.2 8.4 7.9  HGB 8.6* 9.5* 9.2*  HCT 26.1* 28.0* 26.8*  PLT 499* 601* 538*   BMET Recent Labs    05/18/19 0941 05/18/19 2258 05/19/19 0409  NA 131*  --  131*  K 3.1*  --  3.2*  CL 95*  --  100  CO2 23  --  19*  GLUCOSE 209*  --  119*  BUN 25*  --  15  CREATININE 1.49* 1.34* 1.41*  CALCIUM 8.0*  --  8.0*   LFT Recent Labs    05/19/19 0409  PROT 6.0*  ALBUMIN 1.5*  AST 463*  ALT 179*  ALKPHOS 607*  BILITOT 7.4*   PT/INR Recent Labs    05/18/19 0941 05/19/19 0409  LABPROT 15.3* 14.4  INR 1.3* 1.2   Hepatitis Panel No results for input(s): HEPBSAG, HCVAB, HEPAIGM, HEPBIGM in the last 72 hours.    Studies/Results: CT Head Wo Contrast  Result Date:  05/18/2019 CLINICAL DATA:  Ataxia with stroke suspected EXAM: CT HEAD WITHOUT CONTRAST TECHNIQUE: Contiguous axial images were obtained from the base of the skull through the vertex without intravenous contrast. COMPARISON:  02/27/2019 FINDINGS: Brain: No evidence of acute infarction, hemorrhage, hydrocephalus, extra-axial collection or mass lesion/mass effect. Chronic small vessel ischemia in the periventricular white matter. Cerebral volume loss with ventriculomegaly Vascular: No hyperdense vessel or unexpected calcification. Skull: Normal. Negative for fracture or focal lesion. Sinuses/Orbits: No acute finding. IMPRESSION: 1. No acute finding. 2. Atrophy and chronic small vessel ischemia. Electronically Signed   By: Monte Fantasia M.D.   On: 05/18/2019 10:37   CT ABDOMEN PELVIS W CONTRAST  Result Date: 05/18/2019 CLINICAL DATA:  Abdominal pain, neutropenia, elevated LFTs EXAM: CT ABDOMEN AND PELVIS WITH CONTRAST TECHNIQUE: Multidetector CT imaging of the abdomen and pelvis was performed using the standard protocol following bolus administration of intravenous contrast. CONTRAST:  29m OMNIPAQUE IOHEXOL 300 MG/ML  SOLN COMPARISON:  03/20/2019 FINDINGS: Lower chest: No acute abnormality. Three-vessel coronary artery calcifications. Hepatobiliary: Redemonstrated profound intra and extrahepatic biliary ductal dilatation to the ampulla, which is increased compared prior examination, the common bile duct measuring up to 1.6 cm. The gallbladder is distended and contains numerous tiny gallstones. Pancreas: The pancreatic duct is profoundly dilated along its length to the ampulla, measuring up to 1.4 cm centrally. Spleen: Normal in size without significant abnormality. Adrenals/Urinary Tract: Adrenal glands are unremarkable. Kidneys are normal, without renal calculi, solid lesion, or hydronephrosis. Bladder is unremarkable. Stomach/Bowel: Stomach is within normal limits. Appendix appears normal. No evidence of bowel  wall thickening, distention, or inflammatory changes. Vascular/Lymphatic: Aortic atherosclerosis. No enlarged abdominal or pelvic lymph nodes. Reproductive: No mass or other significant abnormality. Other: No abdominal wall hernia or abnormality. No abdominopelvic ascites. Musculoskeletal: No acute or significant osseous findings. IMPRESSION: 1. 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 2.  Cholelithiasis. 3.  Coronary artery disease.  Aortic Atherosclerosis (ICD10-I70.0). Electronically Signed   By: AEddie CandleM.D.   On: 05/18/2019 11:15   DG Chest Port 1 View  Result Date: 05/18/2019 CLINICAL DATA:  Weakness and slurred speech. EXAM: PORTABLE CHEST 1 VIEW COMPARISON:  01/28/2017. FINDINGS: Trachea is midline. Heart size normal. Thoracic aorta is calcified. Lungs are clear. No pleural fluid. IMPRESSION: 1. No acute findings. 2.  Aortic atherosclerosis (ICD10-I70.0). Electronically Signed   By: MLorin PicketM.D.   On: 05/18/2019 10:26    IMPRESSION/PLAN:  166 86year old female with painless jaundice. Elevated LFTs. CTAP 5/3 showed profound intra and extrahepatic biliary ductal dilatation in addition to dilatation of the pancreatic duct, highly concerning for a small ampullary or pancreatic malignancy. Cholelithiasis. Alk phos 652 -> 607. AST 478 -> 463.  ALT 175 -179.  T. bii 6.9 -> 7.4.  Lipase 1132-> 996. She is afebrile. HR 90 - 100's. BP 90/100's/50's.  -She will most likely require ERCP +/- EUS, await further recommendations per Dr. BTarri Glenn The patient wishes to purse any required endoscopic procedure. ERCP/EUS benefits and risk discussed including the risks with general anesthesia, risk of bleeding, perforation, infection and pancreatitis. -Continue LR @  100cc/hr -Ondansetron 428mIV or po PRN Q 6hrs -Continue Zosyn 3.37566mIV Q 8 hrs  -NPO  2. Normocytic anemia, dilutional component.  Hg 9.5 -> 9.2 -> 8.4 -> 7.9.  HCT 26.9. MCV 89.6. FOBT  5/3 negative. No obvious signs of active GI bleeding.  3. DM II   Further recommendations per Dr. Adline Peals Dorathy Daft  05/19/2019, 12:02 PM

## 2019-05-20 ENCOUNTER — Inpatient Hospital Stay (HOSPITAL_COMMUNITY): Payer: Medicare Other | Admitting: Certified Registered"

## 2019-05-20 ENCOUNTER — Encounter (HOSPITAL_COMMUNITY): Payer: Self-pay | Admitting: Internal Medicine

## 2019-05-20 ENCOUNTER — Encounter (HOSPITAL_COMMUNITY)
Admission: EM | Disposition: A | Discharge status: Home Health | Payer: Self-pay | Source: Home / Self Care | Attending: Internal Medicine

## 2019-05-20 ENCOUNTER — Inpatient Hospital Stay (HOSPITAL_COMMUNITY): Payer: Medicare Other

## 2019-05-20 DIAGNOSIS — E43 Unspecified severe protein-calorie malnutrition: Secondary | ICD-10-CM | POA: Insufficient documentation

## 2019-05-20 DIAGNOSIS — K831 Obstruction of bile duct: Secondary | ICD-10-CM

## 2019-05-20 DIAGNOSIS — K8309 Other cholangitis: Secondary | ICD-10-CM

## 2019-05-20 HISTORY — PX: ERCP: SHX5425

## 2019-05-20 HISTORY — PX: BILIARY STENT PLACEMENT: SHX5538

## 2019-05-20 HISTORY — PX: BIOPSY: SHX5522

## 2019-05-20 LAB — CBC WITH DIFFERENTIAL/PLATELET
Abs Immature Granulocytes: 0.03 10*3/uL (ref 0.00–0.07)
Basophils Absolute: 0 10*3/uL (ref 0.0–0.1)
Basophils Relative: 0 %
Eosinophils Absolute: 0 10*3/uL (ref 0.0–0.5)
Eosinophils Relative: 0 %
HCT: 28.2 % — ABNORMAL LOW (ref 36.0–46.0)
Hemoglobin: 9.7 g/dL — ABNORMAL LOW (ref 12.0–15.0)
Immature Granulocytes: 0 %
Lymphocytes Relative: 23 %
Lymphs Abs: 1.6 10*3/uL (ref 0.7–4.0)
MCH: 30.8 pg (ref 26.0–34.0)
MCHC: 34.4 g/dL (ref 30.0–36.0)
MCV: 89.5 fL (ref 80.0–100.0)
Monocytes Absolute: 0.6 10*3/uL (ref 0.1–1.0)
Monocytes Relative: 8 %
Neutro Abs: 4.8 10*3/uL (ref 1.7–7.7)
Neutrophils Relative %: 69 %
Platelets: 513 10*3/uL — ABNORMAL HIGH (ref 150–400)
RBC: 3.15 MIL/uL — ABNORMAL LOW (ref 3.87–5.11)
RDW: 13.9 % (ref 11.5–15.5)
WBC: 7.1 10*3/uL (ref 4.0–10.5)
nRBC: 0 % (ref 0.0–0.2)

## 2019-05-20 LAB — LIPASE, BLOOD: Lipase: 602 U/L — ABNORMAL HIGH (ref 11–51)

## 2019-05-20 LAB — COMPREHENSIVE METABOLIC PANEL
ALT: 181 U/L — ABNORMAL HIGH (ref 0–44)
AST: 491 U/L — ABNORMAL HIGH (ref 15–41)
Albumin: 1.4 g/dL — ABNORMAL LOW (ref 3.5–5.0)
Alkaline Phosphatase: 701 U/L — ABNORMAL HIGH (ref 38–126)
Anion gap: 14 (ref 5–15)
BUN: 13 mg/dL (ref 8–23)
CO2: 19 mmol/L — ABNORMAL LOW (ref 22–32)
Calcium: 8.2 mg/dL — ABNORMAL LOW (ref 8.9–10.3)
Chloride: 98 mmol/L (ref 98–111)
Creatinine, Ser: 1.39 mg/dL — ABNORMAL HIGH (ref 0.44–1.00)
GFR calc Af Amer: 39 mL/min — ABNORMAL LOW (ref >60)
GFR calc non Af Amer: 34 mL/min — ABNORMAL LOW (ref >60)
Glucose, Bld: 94 mg/dL (ref 70–99)
Potassium: 2.8 mmol/L — ABNORMAL LOW (ref 3.5–5.1)
Sodium: 131 mmol/L — ABNORMAL LOW (ref 135–145)
Total Bilirubin: 8.3 mg/dL — ABNORMAL HIGH (ref 0.3–1.2)
Total Protein: 5.9 g/dL — ABNORMAL LOW (ref 6.5–8.1)

## 2019-05-20 LAB — HEMOGLOBIN A1C
Hgb A1c MFr Bld: 6.1 % — ABNORMAL HIGH (ref 4.8–5.6)
Mean Plasma Glucose: 128.37 mg/dL

## 2019-05-20 LAB — LIPID PANEL
Cholesterol: 269 mg/dL — ABNORMAL HIGH (ref 0–200)
HDL: <10 mg/dL — ABNORMAL LOW (ref >40)
Triglycerides: 189 mg/dL — ABNORMAL HIGH (ref <150)
VLDL: 38 mg/dL (ref 0–40)

## 2019-05-20 LAB — CORTISOL-AM, BLOOD: Cortisol - AM: 18.4 ug/dL (ref 6.7–22.6)

## 2019-05-20 LAB — GLUCOSE, CAPILLARY
Glucose-Capillary: 88 mg/dL (ref 70–99)
Glucose-Capillary: 86 mg/dL (ref 70–99)
Glucose-Capillary: 89 mg/dL (ref 70–99)
Glucose-Capillary: 135 mg/dL — ABNORMAL HIGH (ref 70–99)

## 2019-05-20 LAB — POCT I-STAT, CHEM 8
BUN: 13 mg/dL (ref 8–23)
Calcium, Ion: 1.03 mmol/L — ABNORMAL LOW (ref 1.15–1.40)
Chloride: 101 mmol/L (ref 98–111)
Creatinine, Ser: 1.2 mg/dL — ABNORMAL HIGH (ref 0.44–1.00)
Glucose, Bld: 81 mg/dL (ref 70–99)
HCT: 31 % — ABNORMAL LOW (ref 36.0–46.0)
Hemoglobin: 10.5 g/dL — ABNORMAL LOW (ref 12.0–15.0)
Potassium: 3.6 mmol/L (ref 3.5–5.1)
Sodium: 133 mmol/L — ABNORMAL LOW (ref 135–145)
TCO2: 22 mmol/L (ref 22–32)

## 2019-05-20 LAB — BASIC METABOLIC PANEL
Anion gap: 14 (ref 5–15)
BUN: 13 mg/dL (ref 8–23)
CO2: 21 mmol/L — ABNORMAL LOW (ref 22–32)
Calcium: 8.1 mg/dL — ABNORMAL LOW (ref 8.9–10.3)
Chloride: 97 mmol/L — ABNORMAL LOW (ref 98–111)
Creatinine, Ser: 1.34 mg/dL — ABNORMAL HIGH (ref 0.44–1.00)
GFR calc Af Amer: 41 mL/min — ABNORMAL LOW (ref >60)
GFR calc non Af Amer: 36 mL/min — ABNORMAL LOW (ref >60)
Glucose, Bld: 92 mg/dL (ref 70–99)
Potassium: 3.3 mmol/L — ABNORMAL LOW (ref 3.5–5.1)
Sodium: 132 mmol/L — ABNORMAL LOW (ref 135–145)

## 2019-05-20 LAB — PHOSPHORUS: Phosphorus: 3.4 mg/dL (ref 2.5–4.6)

## 2019-05-20 LAB — CEA: CEA: 7.5 ng/mL — ABNORMAL HIGH (ref 0.0–4.7)

## 2019-05-20 LAB — MAGNESIUM
Magnesium: 1.4 mg/dL — ABNORMAL LOW (ref 1.7–2.4)
Magnesium: 1.6 mg/dL — ABNORMAL LOW (ref 1.7–2.4)

## 2019-05-20 LAB — CANCER ANTIGEN 19-9: CA 19-9: <2 U/mL (ref 0–35)

## 2019-05-20 IMAGING — RF DG ERCP WO/W SPHINCTEROTOMY
1 series · 5 of 5 positions shown · non-contrast
Comparison: CT of the abdomen and pelvis on [DATE]

CLINICAL DATA: Biliary and pancreatic ductal dilatation.

EXAM:
ERCP
TECHNIQUE: Multiple spot images obtained with the fluoroscopic device and
submitted for interpretation post-procedure.

[Series 1: unknown protocol · 0.20mm/px · 5 of 5 slices shown]
[im 1/5]
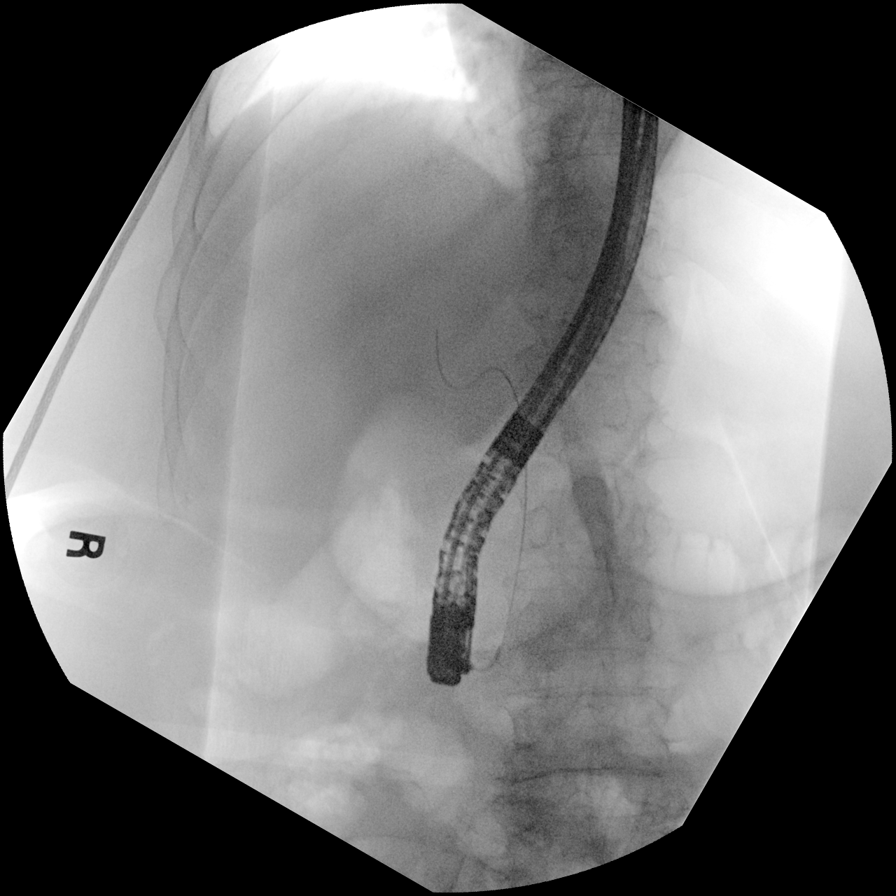
[im 2/5]
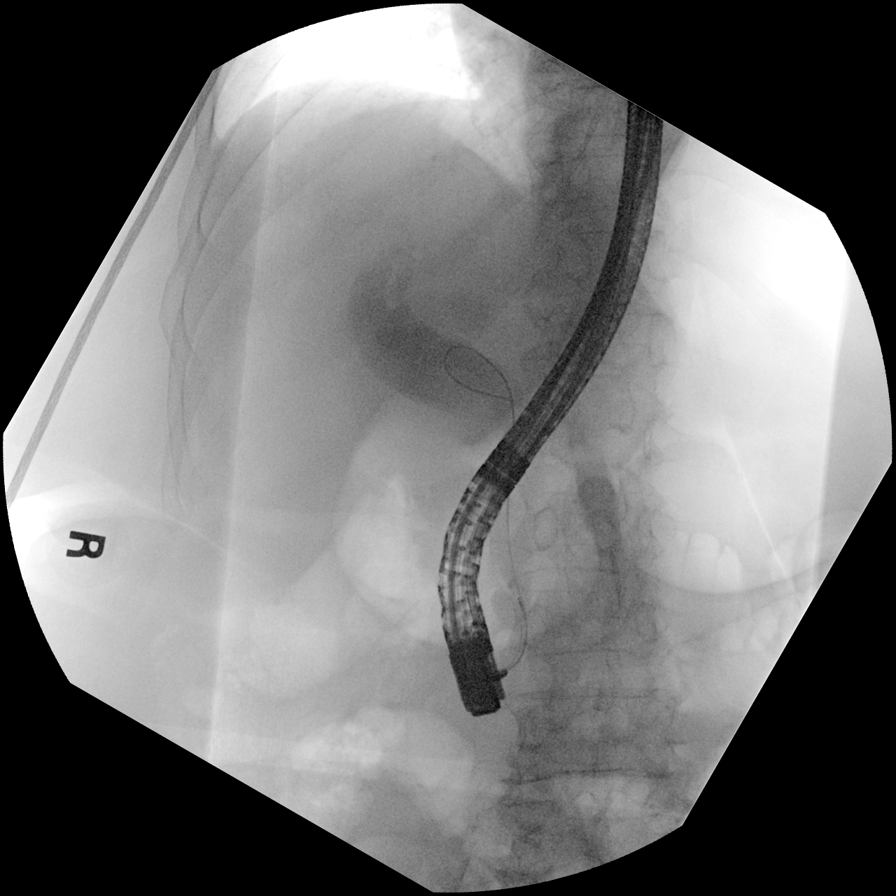
[im 3/5]
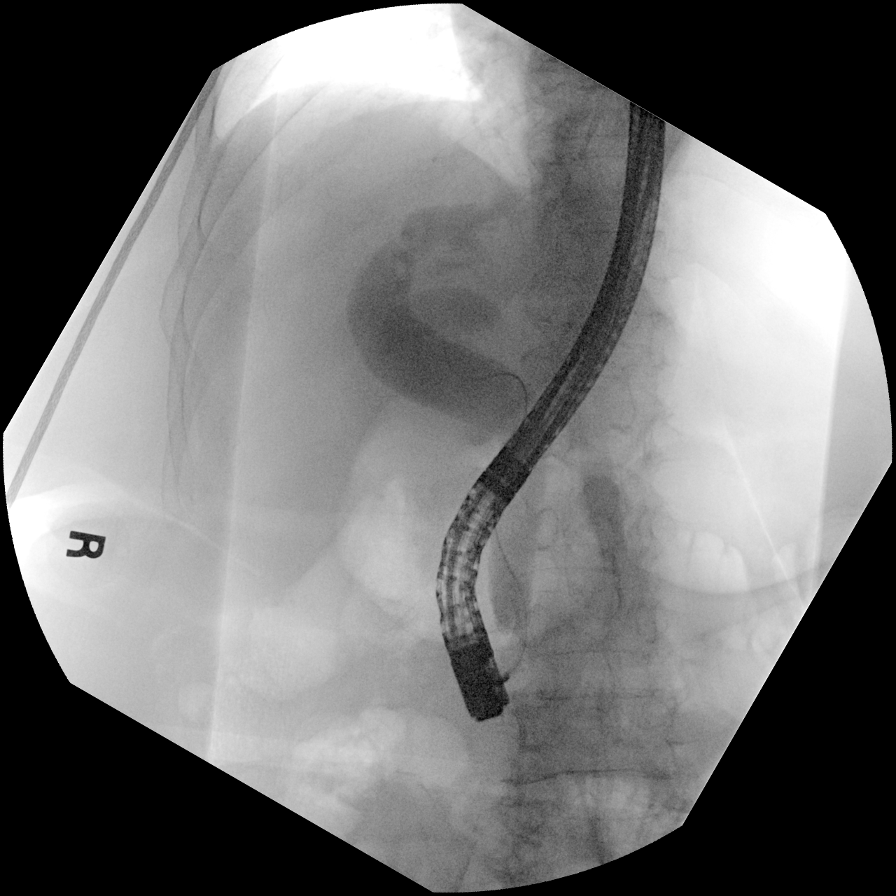
[im 4/5]
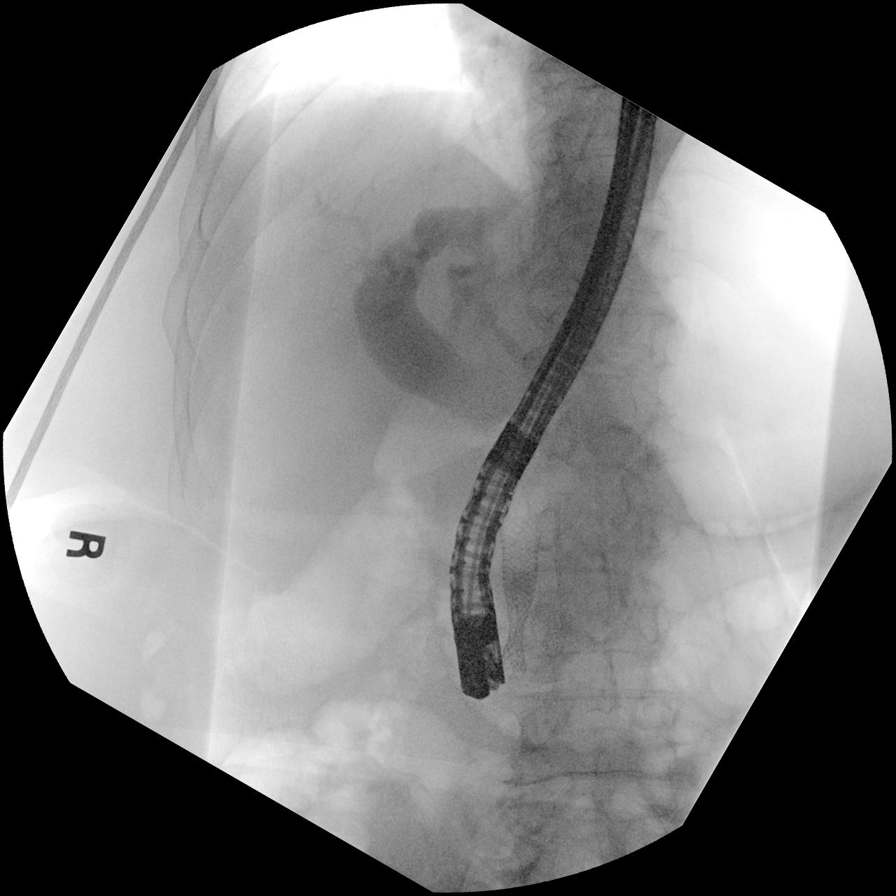
[im 5/5]
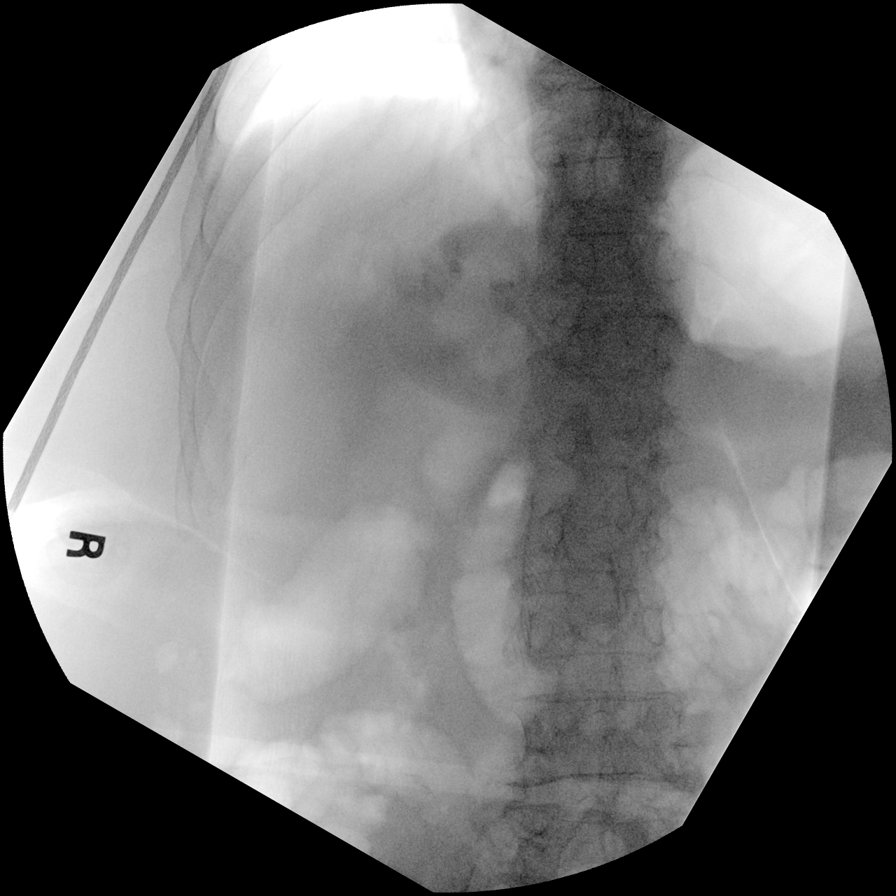

[5 of 5 positions shown; findings below may reference images not displayed]

FINDINGS: Imaging obtained with a C-arm during the endoscopic procedure
demonstrates cannulation of the common bile duct with contrast
injection demonstrating diffuse and massive dilatation of the common
bile duct as well as visualized intrahepatic ducts. A self expanding
metallic biliary stent was placed in the distal CBD.
IMPRESSION: Biliary obstruction treated with placement of a self expanding
metallic stent in the distal CBD.

These images were submitted for radiologic interpretation only.
Please see the procedural report for the amount of contrast and the
fluoroscopy time utilized.

## 2019-05-20 SURGERY — ERCP, WITH INTERVENTION IF INDICATED
Anesthesia: General

## 2019-05-20 MED ORDER — ONDANSETRON HCL 4 MG/2ML IJ SOLN
INTRAMUSCULAR | Status: DC | PRN
  Administered 2019-05-20: 4 mg via INTRAVENOUS

## 2019-05-20 MED ORDER — LIDOCAINE 2% (20 MG/ML) 5 ML SYRINGE
INTRAMUSCULAR | Status: DC | PRN
  Administered 2019-05-20: 50 mg via INTRAVENOUS

## 2019-05-20 MED ORDER — ROCURONIUM BROMIDE 10 MG/ML (PF) SYRINGE
PREFILLED_SYRINGE | INTRAVENOUS | Status: DC | PRN
  Administered 2019-05-20: 20 mg via INTRAVENOUS

## 2019-05-20 MED ORDER — SODIUM CHLORIDE 0.9 % IV SOLN
INTRAVENOUS | Status: DC | PRN
  Administered 2019-05-20: 20 mL

## 2019-05-20 MED ORDER — LACTATED RINGERS IV SOLN: INTRAVENOUS | Status: DC | PRN

## 2019-05-20 MED ORDER — SODIUM CHLORIDE 0.9 % IV SOLN: INTRAVENOUS | Status: DC

## 2019-05-20 MED ORDER — PROPOFOL 10 MG/ML IV BOLUS
INTRAVENOUS | Status: DC | PRN
  Administered 2019-05-20: 80 mg via INTRAVENOUS

## 2019-05-20 MED ORDER — PHENYLEPHRINE HCL-NACL 10-0.9 MG/250ML-% IV SOLN
INTRAVENOUS | Status: DC | PRN
  Administered 2019-05-20: 60 ug/min via INTRAVENOUS

## 2019-05-20 MED ORDER — SUGAMMADEX SODIUM 200 MG/2ML IV SOLN
INTRAVENOUS | Status: DC | PRN
  Administered 2019-05-20: 200 mg via INTRAVENOUS

## 2019-05-20 MED ORDER — GLYCOPYRROLATE PF 0.2 MG/ML IJ SOSY
PREFILLED_SYRINGE | INTRAMUSCULAR | Status: DC | PRN
  Administered 2019-05-20: .1 mg via INTRAVENOUS

## 2019-05-20 MED ORDER — MAGNESIUM SULFATE 2 GM/50ML IV SOLN
2.0000 g | Freq: Once | INTRAVENOUS | Status: AC
  Administered 2019-05-20: 2 g via INTRAVENOUS
  Filled 2019-05-20: qty 50

## 2019-05-20 MED ORDER — GLUCAGON HCL RDNA (DIAGNOSTIC) 1 MG IJ SOLR
INTRAMUSCULAR | Status: AC
  Filled 2019-05-20: qty 1

## 2019-05-20 MED ORDER — INDOMETHACIN 50 MG RE SUPP: 100.0000 mg | Freq: Once | RECTAL | Status: DC

## 2019-05-20 MED ORDER — PHENYLEPHRINE 40 MCG/ML (10ML) SYRINGE FOR IV PUSH (FOR BLOOD PRESSURE SUPPORT)
PREFILLED_SYRINGE | INTRAVENOUS | Status: DC | PRN
  Administered 2019-05-20: 120 ug via INTRAVENOUS
  Administered 2019-05-20 (×2): 80 ug via INTRAVENOUS

## 2019-05-20 MED ORDER — DEXAMETHASONE SODIUM PHOSPHATE 10 MG/ML IJ SOLN
INTRAMUSCULAR | Status: DC | PRN
  Administered 2019-05-20: 4 mg via INTRAVENOUS

## 2019-05-20 MED ORDER — POTASSIUM CHLORIDE 10 MEQ/100ML IV SOLN
10.0000 meq | INTRAVENOUS | Status: AC
  Administered 2019-05-20 (×4): 10 meq via INTRAVENOUS
  Filled 2019-05-20 (×2): qty 100

## 2019-05-20 MED ORDER — SUCCINYLCHOLINE CHLORIDE 200 MG/10ML IV SOSY
PREFILLED_SYRINGE | INTRAVENOUS | Status: DC | PRN
  Administered 2019-05-20: 120 mg via INTRAVENOUS

## 2019-05-20 MED ORDER — INDOMETHACIN 50 MG RE SUPP
RECTAL | Status: AC
  Filled 2019-05-20: qty 2

## 2019-05-20 MED ORDER — FENTANYL CITRATE (PF) 100 MCG/2ML IJ SOLN
INTRAMUSCULAR | Status: DC | PRN
  Administered 2019-05-20 (×3): 25 ug via INTRAVENOUS

## 2019-05-20 MED ORDER — INDOMETHACIN 50 MG RE SUPP
RECTAL | Status: DC | PRN
  Administered 2019-05-20: 100 mg via RECTAL

## 2019-05-20 MED ORDER — POTASSIUM CHLORIDE IN NACL 40-0.9 MEQ/L-% IV SOLN
INTRAVENOUS | Status: AC
  Administered 2019-05-20: 100 mL/h via INTRAVENOUS
  Filled 2019-05-20: qty 1000

## 2019-05-20 NOTE — Anesthesia Postprocedure Evaluation (Signed)
Anesthesia Post Note  Patient: Debra [name]  Procedure(s) Performed: ENDOSCOPIC RETROGRADE CHOLANGIOPANCREATOGRAPHY (ERCP) (N/A ) BIOPSY BILIARY STENT PLACEMENT     Patient location during evaluation: Endoscopy Anesthesia Type: General Level of consciousness: awake Pain management: pain level controlled Vital Signs Assessment: post-procedure vital signs reviewed and stable Respiratory status: spontaneous breathing Cardiovascular status: stable Postop Assessment: no apparent nausea or vomiting Anesthetic complications: no    Last Vitals:  Vitals:   05/20/19 1620 05/20/19 1727  BP: (!) 95/34   Pulse: 97   Resp: (!) 23 17  Temp:    SpO2: 100%     Last Pain:  Vitals:   05/20/19 1620  TempSrc:   PainSc: 0-No pain                 Arneisha Kincannon

## 2019-05-20 NOTE — Anesthesia Procedure Notes (Signed)
Procedure Name: Intubation Date/Time: 05/20/2019 3:12 PM Performed by: Orlie Dakin, CRNA Pre-anesthesia Checklist: Patient identified, Emergency Drugs available, Suction available and Patient being monitored Patient Re-evaluated:Patient Re-evaluated prior to induction Oxygen Delivery Method: Circle system utilized Preoxygenation: Pre-oxygenation with 100% oxygen Induction Type: IV induction Laryngoscope Size: Mac and 4 Grade View: Grade I Tube type: Oral Tube size: 7.5 mm Number of attempts: 1 Airway Equipment and Method: Stylet Placement Confirmation: ETT inserted through vocal cords under direct vision,  positive ETCO2 and breath sounds checked- equal and bilateral Secured at: 23 cm Tube secured with: Tape Dental Injury: Teeth and Oropharynx as per pre-operative assessment

## 2019-05-20 NOTE — Progress Notes (Signed)
STROKE TEAM PROGRESS NOTE   INTERVAL HISTORY Daughter at bedside. Pt is very pleasant and in high spirit. She is not fully orientated and not clearly know the details of her current condition. Discussed with daughter and will do LE venous doppler to rule out DVT given her recent diagnosis pancreatic cancer.   Vitals:   05/20/19 2210 05/21/19 0000 05/21/19 0402 05/21/19 0805  BP:  (!) 98/53 (!) 98/56 (!) 105/49  Pulse:  80 73   Resp: 16 16 17  (!) 21  Temp:  98.2 F (36.8 C) 98.2 F (36.8 C) 98.2 F (36.8 C)  TempSrc:  Axillary Oral Oral  SpO2:  96% 98% 98%  Weight:      Height:        CBC:  Recent Labs  Lab 05/18/19 0941 05/18/19 2258 05/20/19 0420 05/20/19 0420 05/20/19 1449 05/21/19 0346  WBC 8.2   < > 7.1  --   --  5.2  NEUTROABS 6.1  --  4.8  --   --   --   HGB 8.6*   < > 9.7*   < > 10.5* 8.5*  HCT 26.1*   < > 28.2*   < > 31.0* 25.8*  MCV 92.6   < > 89.5  --   --  91.8  PLT 499*   < > 513*  --   --  441*   < > = values in this interval not displayed.    Basic Metabolic Panel:  Recent Labs  Lab 05/20/19 0420 05/20/19 0420 05/20/19 1212 05/20/19 1212 05/20/19 1449 05/21/19 0346  NA 131*   < > 132*   < > 133* 132*  K 2.8*   < > 3.3*   < > 3.6 4.0  CL 98   < > 97*   < > 101 102  CO2 19*   < > 21*  --   --  18*  GLUCOSE 94   < > 92   < > 81 176*  BUN 13   < > 13   < > 13 14  CREATININE 1.39*   < > 1.34*   < > 1.20* 1.54*  CALCIUM 8.2*   < > 8.1*  --   --  8.0*  MG 1.4*   < > 1.6*  --   --  1.6*  PHOS 3.4  --   --   --   --   --    < > = values in this interval not displayed.   Lipid Panel:     Component Value Date/Time   CHOL 269 (H) 05/20/2019 0420   TRIG 189 (H) 05/20/2019 0420   HDL <10 (L) 05/20/2019 0420   CHOLHDL NOT CALCULATED 05/20/2019 0420   VLDL 38 05/20/2019 0420   LDLCALC NOT CALCULATED 05/20/2019 0420   HgbA1c:  Lab Results  Component Value Date   HGBA1C 6.1 (H) 05/20/2019   Urine Drug Screen: No results found for: LABOPIA,  COCAINSCRNUR, LABBENZ, AMPHETMU, THCU, LABBARB  Alcohol Level No results found for: ETH  IMAGING past 24 hours DG ERCP BILIARY & PANCREATIC DUCTS  Result Date: 05/20/2019 CLINICAL DATA:  Biliary and pancreatic ductal dilatation. EXAM: ERCP TECHNIQUE: Multiple spot images obtained with the fluoroscopic device and submitted for interpretation post-procedure. COMPARISON:  CT of the abdomen and pelvis on 05/18/2019 FINDINGS: Imaging obtained with a C-arm during the endoscopic procedure demonstrates cannulation of the common bile duct with contrast injection demonstrating diffuse and massive dilatation of the common bile duct  as well as visualized intrahepatic ducts. A self expanding metallic biliary stent was placed in the distal CBD. IMPRESSION: Biliary obstruction treated with placement of a self expanding metallic stent in the distal CBD. These images were submitted for radiologic interpretation only. Please see the procedural report for the amount of contrast and the fluoroscopy time utilized. Electronically Signed   By: Aletta Edouard M.D.   On: 05/20/2019 16:13    PHYSICAL EXAM  Temp:  [97.6 F (36.4 C)-98.2 F (36.8 C)] 98.2 F (36.8 C) (05/06 0805) Pulse Rate:  [73-103] 73 (05/06 0402) Resp:  [12-23] 18 (05/06 1157) BP: (94-110)/(34-56) 95/51 (05/06 1152) SpO2:  [96 %-100 %] 99 % (05/06 1152)  General - Thin built, well developed, in no apparent distress, pleasant.  Ophthalmologic - fundi not visualized due to noncooperation.  Cardiovascular - Regular rhythm and rate.  Neuro - awake alert, pleasant and talkative. No aphasia, able to follow simple commands, speech fluent, however, naming 2/5, able to repeat. Hard of hearing. Blinking to visual threat bilaterally. PERRL, EOMI. No significant facial droop. Tongue midline. Moving all extremities equally symmetrically except right shoulder decreased ROM, which is chronic as per daughter (for the last 2 months at least). BUE distal strength  equally. DTR 1+ and no babinski. Sensation symmetrical. B/l FTN slow but intact. Right FTN difficulty with right should limited ROM but no ataxia. Gait not tested.    ASSESSMENT/PLAN Debra [name] is a 86 y.o. female with history of HTN, HLD, DB and baseline memory loss w/o formal dementia dx with transient L facial droop and slurred speech. Upon arrival c/o RUE weakness. MRI neg. Found to have acute pancreatitis.  Possible TIA - likely small vessel disease. However, hypercoagulable state from pancreatic cancer is in DDx.    CT head No acute abnormality. Small vessel disease. Atrophy.   MRI  No acute abnormality. Small vessel disease. Atrophy. L frontal convexity meningioma   MRA head Unremarkable   MRA neck B ICA bifurcation atherosclerosis ~50% stenoses  2D Echo EF 60-65%. No source of embolus   LE doppler no DVT   LDL NOT CALCULATED due to low HDL  HgbA1c 6.1  Heparin 5000 units sq tid for VTE prophylaxis  aspirin 81 mg daily prior to admission, now on aspirin 325 mg daily. No DAPT due to possible future procedures. OK with anticoagulation if oncology feels indicated for hypercoagulable state.   Therapy recommendations:  HH PT, no OT  Disposition:  Return home  Pancreatic cancer, new diagnosis  Unintentional weight loss of 30# past 1 yr  Protein calorie malnutrition  Obstructive jaundice  CT with profound intra and extrahepatic biliary duct dilation. Concern about a small ampullary or pancreatic malignancy based on imaging.  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  LE venous doppler no DVT  Oncology consulted  OK with anticoagulation if oncology feels indicated for hypercoagulable state  Hypertension  Soft BP 90-100s . BP goal normotensive  Hyperlipidemia  Home meds:  lipitor 80, currently on hold in hospital  LDL NOT  CALCULATED, goal < 70  Pt not candidate for statin due to high AST and ALT  Diabetes type II Controlled  HgbA1c 6.1, goal < 7.0  CBGs  SSI  PCP follow up  Other Stroke Risk Factors  Advanced age  Other Active Problems  Baseline cognitive deficits  Hypokalemia  Hospital day #  3  Neurology will sign off. Please call with questions. Pt will follow up with stroke clinic NP at [facility] in about 4 weeks. Thanks for the consult.  Rosalin Hawking, MD PhD Stroke Neurology 05/21/2019 7:08 PM   To contact Stroke Continuity provider, please refer to [email or web address] After hours, contact General Neurology

## 2019-05-20 NOTE — Transfer of Care (Signed)
Immediate Anesthesia Transfer of Care Note  Patient: Debra [name]  Procedure(s) Performed: ENDOSCOPIC RETROGRADE CHOLANGIOPANCREATOGRAPHY (ERCP) (N/A ) BIOPSY BILIARY STENT PLACEMENT  Patient Location: Endoscopy Unit  Anesthesia Type:General  Level of Consciousness: drowsy  Airway & Oxygen Therapy: Patient Spontanous Breathing and Patient connected to face mask oxygen  Post-op Assessment: Report given to RN and Post -op Vital signs reviewed and stable  Post vital signs: Reviewed and stable  Last Vitals:  Vitals Value Taken Time  BP 99/43 05/20/19 1609  Temp    Pulse 99 05/20/19 1610  Resp 26 05/20/19 1610  SpO2 100 % 05/20/19 1610  Vitals shown include unvalidated device data.  Last Pain:  Vitals:   05/20/19 1444  TempSrc: Temporal  PainSc: 0-No pain      Patients Stated Pain Goal: 0 (82/08/13 8871)  Complications: No apparent anesthesia complications

## 2019-05-20 NOTE — Anesthesia Preprocedure Evaluation (Signed)
Anesthesia Evaluation  Patient identified by MRN, date of birth, ID band Patient awake    Reviewed: Allergy & Precautions, NPO status   Airway Mallampati: II  TM Distance: >3 FB     Dental   Pulmonary    breath sounds clear to auscultation       Cardiovascular hypertension,  Rhythm:Regular Rate:Normal     Neuro/Psych    GI/Hepatic Neg liver ROS, History noted. CG   Endo/Other  diabetes  Renal/GU Renal disease     Musculoskeletal   Abdominal   Peds  Hematology   Anesthesia Other Findings   Reproductive/Obstetrics                             Anesthesia Physical Anesthesia Plan  ASA: III  Anesthesia Plan: General   Post-op Pain Management:    Induction: Intravenous  PONV Risk Score and Plan: 3 and Ondansetron and Dexamethasone  Airway Management Planned: Oral ETT  Additional Equipment:   Intra-op Plan:   Post-operative Plan: Possible Post-op intubation/ventilation  Informed Consent: I have reviewed the patients History and Physical, chart, labs and discussed the procedure including the risks, benefits and alternatives for the proposed anesthesia with the patient or authorized representative who has indicated his/her understanding and acceptance.     Dental advisory given  Plan Discussed with: CRNA and Anesthesiologist  Anesthesia Plan Comments:         Anesthesia Quick Evaluation

## 2019-05-20 NOTE — Progress Notes (Signed)
PROGRESS NOTE    Debra [name]  JME:[id number] DOB: [birth year] DOA: 05/18/2019 PCP: Asencion Noble, MD   Brief Narrative: 86 year old with past medical history significant for diabetes, hypertension, hyperlipidemia presented to [facility], ED with a slurry speech, left facial droop, Johnson's and unintentional weight loss.  Reportedly had a slurred speech and left facial droop the morning of presentation that lasted about 30 minutes.  Family also noticed jaundice.  Reportedly had about 30 pounds weight loss over the last year.  Patient was found to have transaminases, elevated lipase 1132, CT head without acute finding.  CT abdomen and pelvis show pancreatic duct and intra and extrahepatic biliary duct dilation and cholelithiasis.  Sequently patient complaining of right upper arm weakness, reported to be new per family.  Neurology was consulted CVA work-up initiated.  Assessment & Plan:   Active Problems:   Dilation of biliary tract   Gallstone pancreatitis   Pancreatic duct dilated   Lactic acidosis   Acute cholangitis   Unintentional weight loss   Slurred speech   HLD (hyperlipidemia)   HTN (hypertension)   DM (diabetes mellitus), type 2 (HCC)   Hypokalemia   TIA (transient ischemic attack)   Protein-calorie malnutrition, severe  1-Obstructive jaundice, acute pancreatitis; ampullary mass, cholangitis, hyperbilirubinemia patient presented with lipase in the 1000 range. She presented also with obstructive jaundice.  CT with profound intra and extrahepatic biliary duct dilation and slightly increased pancreatic duct dilation.  Concern about a small ampullary or pancreatic malignancy based on imaging. 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  Biopsy obtained.  The mass calluses ensured a broad distal CBD stricture which stent was placed cholangitis noted. Continue with Zosyn GI is recommending  5 to 7 days of antibiotics given purulent cholangitis.  2-right upper extremity weakness/transient left facial droop and slurred speech CVA work-up ; MRI head: No acute intracranial abnormality, mild chronic small vessel disease, 8 mm meningioma.  MR a head; atheromatous change about the carotid bifurcation stenosis of approximately 50% by NASCET criteria bilaterally Aspirin daily Need to start statin when liver function test improved Neurology following.  Diabetes: Continue with a sliding scale insulin Hypertension: Permissive hypertension continue with fluid Lactic acidosis: Related to cholangitis.  Continue with IV fluids and IV antibiotics HypoKalemia: Replete IV Normocytic anemia: Follow  Nutrition Problem: Severe Malnutrition Etiology: chronic illness    Signs/Symptoms: severe fat depletion, severe muscle depletion, moderate fat depletion, moderate muscle depletion    Interventions: Refer to RD note for recommendations, Boost Breeze  Estimated body mass index is 22.71 kg/m as calculated from the following:   Height as of this encounter: 5\' 7"  (1.702 m).   Weight as of this encounter: 65.8 kg.   DVT prophylaxis: SCDs Code Status: Full code Family Communication: Care discussed with patient Disposition Plan:  Patient is from: Home Anticipated d/c date: 2 or 3 days after resolution of pancreatitis and obstructive jaundice. Anticipate to discharge home Barriers to d/c or necessity for inpatient status: Plan for ERCP today  Consultants:   GI  Neurology  Procedures:   ERCP  Antimicrobials:  Zosyn  Subjective: Denies abdominal pain.   Objective: Vitals:   05/20/19 1444 05/20/19 1609 05/20/19 1620 05/20/19 1727  BP: (!) 110/40 (!) 99/43 (!) 95/34   Pulse: 92 98 97   Resp: (!) 23 18 (!) 23 17  Temp: 98.1 F (36.7 C) 98 F (36.7 C)  TempSrc: Temporal Temporal    SpO2: 100% 100% 100%   Weight:      Height:        Intake/Output Summary (Last 24 hours)  at 05/20/2019 1759 Last data filed at 05/20/2019 1610 Gross per 24 hour  Intake 859.61 ml  Output --  Net 859.61 ml   Filed Weights   05/18/19 0903  Weight: 65.8 kg    Examination:  General exam: Appears calm and comfortable jaundice Respiratory system: Clear to auscultation. Respiratory effort normal. Cardiovascular system: S1 & S2 heard, RRR. No JVD, murmurs, rubs, gallops or clicks. No pedal edema. Gastrointestinal system: Abdomen is nondistended, soft and nontender. No organomegaly or masses felt. Normal bowel sounds heard. Central nervous system: Alert and oriented.  Extremities: Symmetric 5 x 5 power.    Data Reviewed: I have personally reviewed following labs and imaging studies  CBC: Recent Labs  Lab 05/18/19 0941 05/18/19 2258 05/19/19 0409 05/20/19 0420 05/20/19 1449  WBC 8.2 8.4 7.9 7.1  --   NEUTROABS 6.1  --   --  4.8  --   HGB 8.6* 9.5* 9.2* 9.7* 10.5*  HCT 26.1* 28.0* 26.8* 28.2* 31.0*  MCV 92.6 90.3 89.6 89.5  --   PLT 499* 601* 538* 513*  --    Basic Metabolic Panel: Recent Labs  Lab 05/18/19 0941 05/18/19 0941 05/18/19 2258 05/19/19 0409 05/20/19 0420 05/20/19 1212 05/20/19 1449  NA 131*  --   --  131* 131* 132* 133*  K 3.1*  --   --  3.2* 2.8* 3.3* 3.6  CL 95*  --   --  100 98 97* 101  CO2 23  --   --  19* 19* 21*  --   GLUCOSE 209*  --   --  119* 94 92 81  BUN 25*  --   --  15 13 13 13   CREATININE 1.49*   < > 1.34* 1.41* 1.39* 1.34* 1.20*  CALCIUM 8.0*  --   --  8.0* 8.2* 8.1*  --   MG 1.7  --   --   --  1.4* 1.6*  --   PHOS  --   --   --   --  3.4  --   --    < > = values in this interval not displayed.   GFR: Estimated Creatinine Clearance: 32.1 mL/min (A) (by C-G formula based on SCr of 1.2 mg/dL (H)). Liver Function Tests: Recent Labs  Lab 05/18/19 0941 05/19/19 0409 05/19/19 1515 05/20/19 0420  AST 478* 463*  --  491*  ALT 175* 179*  --  181*  ALKPHOS 652* 607*  --  701*  BILITOT 6.9* 7.4* 8.0* 8.3*  PROT 5.7* 6.0*  --   5.9*  ALBUMIN 1.7* 1.5*  --  1.4*   Recent Labs  Lab 05/18/19 0941 05/19/19 0409 05/20/19 0420  LIPASE 1,132* 996* 602*   No results for input(s): AMMONIA in the last 168 hours. Coagulation Profile: Recent Labs  Lab 05/18/19 0941 05/19/19 0409  INR 1.3* 1.2   Cardiac Enzymes: Recent Labs  Lab 05/19/19 1515  CKTOTAL 550*   BNP (last 3 results) No results for input(s): PROBNP in the last 8760 hours. HbA1C: Recent Labs    05/18/19 2258 05/20/19 0420  HGBA1C 6.1* 6.1*   CBG: Recent Labs  Lab 05/19/19 1625 05/19/19 2144 05/20/19 0751 05/20/19 1224 05/20/19 1620  GLUCAP 92 105* 88 86 89   Lipid Profile: Recent Labs    05/20/19 0420  CHOL 269*  HDL <10*  LDLCALC NOT CALCULATED  TRIG 189*  CHOLHDL NOT CALCULATED   Thyroid Function Tests: Recent Labs    05/19/19 1519  TSH 4.886*   Anemia Panel: Recent Labs    05/19/19 1515 05/19/19 1519  VITAMINB12 4,543*  --   FOLATE  --  14.9  FERRITIN 771*  --   TIBC 137*  --   IRON 46  --   RETICCTPCT  --  2.0   Sepsis Labs: Recent Labs  Lab 05/18/19 1136 05/18/19 1658 05/19/19 0844 05/19/19 1126  LATICACIDVEN 4.2* 3.0* 1.5 1.5    Recent Results (from the past 240 hour(s))  Respiratory Panel by RT PCR (Flu A&B, Covid) - Urine, Catheterized     Status: None   Collection Time: 05/18/19 10:47 AM   Specimen: Urine, Catheterized  Result Value Ref Range Status   SARS Coronavirus 2 by RT PCR NEGATIVE NEGATIVE Final    Comment: (NOTE) SARS-CoV-2 target nucleic acids are NOT DETECTED. The SARS-CoV-2 RNA is generally detectable in upper respiratoy specimens during the acute phase of infection. The lowest concentration of SARS-CoV-2 viral copies this assay can detect is 131 copies/mL. A negative result does not preclude SARS-Cov-2 infection and should not be used as the sole basis for treatment or other patient management decisions. A negative result may occur with  improper specimen collection/handling,  submission of specimen other than nasopharyngeal swab, presence of viral mutation(s) within the areas targeted by this assay, and inadequate number of viral copies (<131 copies/mL). A negative result must be combined with clinical observations, patient history, and epidemiological information. The expected result is Negative. Fact Sheet for Patients:  PinkCheek.be Fact Sheet for Healthcare Providers:  GravelBags.it This test is not yet ap proved or cleared by the [place] FDA and  has been authorized for detection and/or diagnosis of SARS-CoV-2 by FDA under an Emergency Use Authorization (EUA). This EUA will remain  in effect (meaning this test can be used) for the duration of the COVID-19 declaration under Section 564(b)(1) of the Act, 21 U.S.C. section 360bbb-3(b)(1), unless the authorization is terminated or revoked sooner.    Influenza A by PCR NEGATIVE NEGATIVE Final   Influenza B by PCR NEGATIVE NEGATIVE Final    Comment: (NOTE) The Xpert Xpress SARS-CoV-2/FLU/RSV assay is intended as an aid in  the diagnosis of influenza from Nasopharyngeal swab specimens and  should not be used as a sole basis for treatment. Nasal washings and  aspirates are unacceptable for Xpert Xpress SARS-CoV-2/FLU/RSV  testing. Fact Sheet for Patients: PinkCheek.be Fact Sheet for Healthcare Providers: GravelBags.it This test is not yet approved or cleared by the [place] FDA and  has been authorized for detection and/or diagnosis of SARS-CoV-2 by  FDA under an Emergency Use Authorization (EUA). This EUA will remain  in effect (meaning this test can be used) for the duration of the  Covid-19 declaration under Section 564(b)(1) of the Act, 21  U.S.C. section 360bbb-3(b)(1), unless the authorization is  terminated or revoked. Performed at [facility], [address],  [place], [place] [address]   Urine Culture     Status: Abnormal   Collection Time: 05/18/19 11:07 AM   Specimen: Urine, Clean Catch  Result Value Ref Range Status   Specimen Description   Final    URINE, CLEAN CATCH Performed at [facility], [address], [place], [place] [address]    Special Requests   Final    NONE Performed at [facility],  [address], [place], [place] [address]    Culture (A)  Final    <10,000 COLONIES/mL INSIGNIFICANT GROWTH Performed at [place] [address], [place], [place] [address]    Report Status 05/19/2019 FINAL  Final         Radiology Studies: MR ANGIO HEAD WO CONTRAST  Result Date: 05/19/2019 CLINICAL DATA:  Initial evaluation for acute slurred speech, left-sided facial droop. EXAM: MR HEAD WITHOUT CONTRAST MR CIRCLE OF WILLIS WITHOUT CONTRAST MRA OF THE NECK WITHOUT AND WITH CONTRAST TECHNIQUE: Multiplanar, multiecho pulse sequences of the brain, circle of Willis and surrounding structures were obtained without intravenous contrast. Angiographic images of the neck were obtained using MRA technique without and with intravenous contrast. CONTRAST:  6.5 cc of Gadavist. COMPARISON:  Prior head CT from 05/18/2019. FINDINGS: MRI HEAD FINDINGS Brain: Diffuse prominence of the CSF containing spaces compatible with generalized age-related cerebral atrophy. Patchy and confluent T2/FLAIR hyperintensity within the periventricular and deep white matter both cerebral hemispheres most consistent with chronic small vessel ischemic disease, mild for age. No abnormal foci of restricted diffusion to suggest acute or subacute ischemia. Gray-white matter differentiation maintained. No encephalomalacia to suggest chronic cortical infarction. No evidence for acute or chronic intracranial hemorrhage. 8 mm meningioma overlies the left frontal convexity without associated mass effect (series 6, image 15). No other mass lesion, mass effect, or midline shift. Diffuse  ventricular prominence related to global parenchymal volume loss without hydrocephalus. No extra-axial fluid collection. Note made of a partially empty sella. Midline structures intact. Vascular: Major intracranial vascular flow voids are maintained. Skull and upper cervical spine: Craniocervical junction within normal limits. Multilevel degenerative spondylolysis noted within the upper cervical spine with resultant moderate spinal stenosis at the level of C3-4. Bone marrow signal intensity within normal limits. No focal marrow replacing lesion. No scalp soft tissue abnormality. Sinuses/Orbits: Patient status post bilateral ocular lens replacement. Small amount of pneumatized secretions noted within the left sphenoid sinus. Paranasal sinuses are otherwise largely clear. No significant mastoid effusion. Inner ear structures grossly normal. Other: None. MRA HEAD FINDINGS ANTERIOR CIRCULATION: Visualized distal cervical segments of the internal carotid arteries are patent with symmetric antegrade flow. Petrous, cavernous, and supraclinoid ICAs patent without flow-limiting stenosis. A1 segments widely patent. Normal anterior communicating artery complex. Anterior cerebral arteries patent to their distal aspects without stenosis. No M1 stenosis or occlusion. Normal MCA bifurcations. Distal MCA branches well perfused and symmetric. POSTERIOR CIRCULATION: Partially visualized vertebral arteries widely patent. Right vertebral artery dominant. Both picas patent. Basilar patent to its distal aspect without stenosis. Superior cerebral arteries patent bilaterally. Right PCA supplied via the basilar. Predominant fetal type origin of the left PCA. Both PCAs well perfused to their distal aspects. No intracranial aneurysm. MRA NECK FINDINGS: AORTIC ARCH: Examination degraded by motion artifact. Visualized aortic arch of normal caliber with normal 3 vessel morphology. No flow-limiting stenosis about the origin of the great vessels.  Visualized subclavian arteries widely patent. RIGHT CAROTID SYSTEM: Right CCA tortuous but widely patent to the bifurcation without stenosis. Atheromatous irregularity about the right carotid bifurcation with associated stenosis of up to approximate 50% by NASCET criteria. Right ICA patent distally to the skull base without stenosis or occlusion. LEFT CAROTID SYSTEM: Left CCA patent from its origin to the bifurcation without stenosis. Atheromatous irregularity about the left carotid bifurcation with associated stenosis of up to approximately 50% by NASCET criteria. Left ICA otherwise patent distally to the skull base without stenosis or occlusion. VERTEBRAL ARTERIES: Both  vertebral arteries arise from the subclavian arteries. Right vertebral artery dominant. Vertebral arteries not well assessed proximally due to motion. Visualized portions are mildly tortuous but widely patent without stenosis or occlusion. IMPRESSION: MRI HEAD IMPRESSION: 1. No acute intracranial infarct or other abnormality. 2. Generalized age advanced cerebral atrophy with mild chronic small vessel ischemic disease. 3. 8 mm meningioma overlying the left frontal convexity without associated mass effect. MRA HEAD IMPRESSION: Negative intracranial MRA with no large vessel occlusion. No hemodynamically significant or correctable stenosis. MRA NECK IMPRESSION: 1. Atheromatous change about the carotid bifurcations with estimated stenoses of up to approximately 50% by NASCET criteria bilaterally. 2. Patent vertebral arteries within the neck. Right vertebral artery dominant. Electronically Signed   By: Jeannine Boga M.D.   On: 05/19/2019 20:56   MR ANGIO NECK W WO CONTRAST  Result Date: 05/19/2019 CLINICAL DATA:  Initial evaluation for acute slurred speech, left-sided facial droop. EXAM: MR HEAD WITHOUT CONTRAST MR CIRCLE OF WILLIS WITHOUT CONTRAST MRA OF THE NECK WITHOUT AND WITH CONTRAST TECHNIQUE: Multiplanar, multiecho pulse sequences of  the brain, circle of Willis and surrounding structures were obtained without intravenous contrast. Angiographic images of the neck were obtained using MRA technique without and with intravenous contrast. CONTRAST:  6.5 cc of Gadavist. COMPARISON:  Prior head CT from 05/18/2019. FINDINGS: MRI HEAD FINDINGS Brain: Diffuse prominence of the CSF containing spaces compatible with generalized age-related cerebral atrophy. Patchy and confluent T2/FLAIR hyperintensity within the periventricular and deep white matter both cerebral hemispheres most consistent with chronic small vessel ischemic disease, mild for age. No abnormal foci of restricted diffusion to suggest acute or subacute ischemia. Gray-white matter differentiation maintained. No encephalomalacia to suggest chronic cortical infarction. No evidence for acute or chronic intracranial hemorrhage. 8 mm meningioma overlies the left frontal convexity without associated mass effect (series 6, image 15). No other mass lesion, mass effect, or midline shift. Diffuse ventricular prominence related to global parenchymal volume loss without hydrocephalus. No extra-axial fluid collection. Note made of a partially empty sella. Midline structures intact. Vascular: Major intracranial vascular flow voids are maintained. Skull and upper cervical spine: Craniocervical junction within normal limits. Multilevel degenerative spondylolysis noted within the upper cervical spine with resultant moderate spinal stenosis at the level of C3-4. Bone marrow signal intensity within normal limits. No focal marrow replacing lesion. No scalp soft tissue abnormality. Sinuses/Orbits: Patient status post bilateral ocular lens replacement. Small amount of pneumatized secretions noted within the left sphenoid sinus. Paranasal sinuses are otherwise largely clear. No significant mastoid effusion. Inner ear structures grossly normal. Other: None. MRA HEAD FINDINGS ANTERIOR CIRCULATION: Visualized distal  cervical segments of the internal carotid arteries are patent with symmetric antegrade flow. Petrous, cavernous, and supraclinoid ICAs patent without flow-limiting stenosis. A1 segments widely patent. Normal anterior communicating artery complex. Anterior cerebral arteries patent to their distal aspects without stenosis. No M1 stenosis or occlusion. Normal MCA bifurcations. Distal MCA branches well perfused and symmetric. POSTERIOR CIRCULATION: Partially visualized vertebral arteries widely patent. Right vertebral artery dominant. Both picas patent. Basilar patent to its distal aspect without stenosis. Superior cerebral arteries patent bilaterally. Right PCA supplied via the basilar. Predominant fetal type origin of the left PCA. Both PCAs well perfused to their distal aspects. No intracranial aneurysm. MRA NECK FINDINGS: AORTIC ARCH: Examination degraded by motion artifact. Visualized aortic arch of normal caliber with normal 3 vessel morphology. No flow-limiting stenosis about the origin of the great vessels. Visualized subclavian arteries widely patent. RIGHT CAROTID SYSTEM: Right CCA tortuous but  widely patent to the bifurcation without stenosis. Atheromatous irregularity about the right carotid bifurcation with associated stenosis of up to approximate 50% by NASCET criteria. Right ICA patent distally to the skull base without stenosis or occlusion. LEFT CAROTID SYSTEM: Left CCA patent from its origin to the bifurcation without stenosis. Atheromatous irregularity about the left carotid bifurcation with associated stenosis of up to approximately 50% by NASCET criteria. Left ICA otherwise patent distally to the skull base without stenosis or occlusion. VERTEBRAL ARTERIES: Both vertebral arteries arise from the subclavian arteries. Right vertebral artery dominant. Vertebral arteries not well assessed proximally due to motion. Visualized portions are mildly tortuous but widely patent without stenosis or occlusion.  IMPRESSION: MRI HEAD IMPRESSION: 1. No acute intracranial infarct or other abnormality. 2. Generalized age advanced cerebral atrophy with mild chronic small vessel ischemic disease. 3. 8 mm meningioma overlying the left frontal convexity without associated mass effect. MRA HEAD IMPRESSION: Negative intracranial MRA with no large vessel occlusion. No hemodynamically significant or correctable stenosis. MRA NECK IMPRESSION: 1. Atheromatous change about the carotid bifurcations with estimated stenoses of up to approximately 50% by NASCET criteria bilaterally. 2. Patent vertebral arteries within the neck. Right vertebral artery dominant. Electronically Signed   By: Jeannine Boga M.D.   On: 05/19/2019 20:56   MR BRAIN WO CONTRAST  Result Date: 05/19/2019 CLINICAL DATA:  Initial evaluation for acute slurred speech, left-sided facial droop. EXAM: MR HEAD WITHOUT CONTRAST MR CIRCLE OF WILLIS WITHOUT CONTRAST MRA OF THE NECK WITHOUT AND WITH CONTRAST TECHNIQUE: Multiplanar, multiecho pulse sequences of the brain, circle of Willis and surrounding structures were obtained without intravenous contrast. Angiographic images of the neck were obtained using MRA technique without and with intravenous contrast. CONTRAST:  6.5 cc of Gadavist. COMPARISON:  Prior head CT from 05/18/2019. FINDINGS: MRI HEAD FINDINGS Brain: Diffuse prominence of the CSF containing spaces compatible with generalized age-related cerebral atrophy. Patchy and confluent T2/FLAIR hyperintensity within the periventricular and deep white matter both cerebral hemispheres most consistent with chronic small vessel ischemic disease, mild for age. No abnormal foci of restricted diffusion to suggest acute or subacute ischemia. Gray-white matter differentiation maintained. No encephalomalacia to suggest chronic cortical infarction. No evidence for acute or chronic intracranial hemorrhage. 8 mm meningioma overlies the left frontal convexity without associated  mass effect (series 6, image 15). No other mass lesion, mass effect, or midline shift. Diffuse ventricular prominence related to global parenchymal volume loss without hydrocephalus. No extra-axial fluid collection. Note made of a partially empty sella. Midline structures intact. Vascular: Major intracranial vascular flow voids are maintained. Skull and upper cervical spine: Craniocervical junction within normal limits. Multilevel degenerative spondylolysis noted within the upper cervical spine with resultant moderate spinal stenosis at the level of C3-4. Bone marrow signal intensity within normal limits. No focal marrow replacing lesion. No scalp soft tissue abnormality. Sinuses/Orbits: Patient status post bilateral ocular lens replacement. Small amount of pneumatized secretions noted within the left sphenoid sinus. Paranasal sinuses are otherwise largely clear. No significant mastoid effusion. Inner ear structures grossly normal. Other: None. MRA HEAD FINDINGS ANTERIOR CIRCULATION: Visualized distal cervical segments of the internal carotid arteries are patent with symmetric antegrade flow. Petrous, cavernous, and supraclinoid ICAs patent without flow-limiting stenosis. A1 segments widely patent. Normal anterior communicating artery complex. Anterior cerebral arteries patent to their distal aspects without stenosis. No M1 stenosis or occlusion. Normal MCA bifurcations. Distal MCA branches well perfused and symmetric. POSTERIOR CIRCULATION: Partially visualized vertebral arteries widely patent. Right vertebral artery dominant. Both  picas patent. Basilar patent to its distal aspect without stenosis. Superior cerebral arteries patent bilaterally. Right PCA supplied via the basilar. Predominant fetal type origin of the left PCA. Both PCAs well perfused to their distal aspects. No intracranial aneurysm. MRA NECK FINDINGS: AORTIC ARCH: Examination degraded by motion artifact. Visualized aortic arch of normal caliber  with normal 3 vessel morphology. No flow-limiting stenosis about the origin of the great vessels. Visualized subclavian arteries widely patent. RIGHT CAROTID SYSTEM: Right CCA tortuous but widely patent to the bifurcation without stenosis. Atheromatous irregularity about the right carotid bifurcation with associated stenosis of up to approximate 50% by NASCET criteria. Right ICA patent distally to the skull base without stenosis or occlusion. LEFT CAROTID SYSTEM: Left CCA patent from its origin to the bifurcation without stenosis. Atheromatous irregularity about the left carotid bifurcation with associated stenosis of up to approximately 50% by NASCET criteria. Left ICA otherwise patent distally to the skull base without stenosis or occlusion. VERTEBRAL ARTERIES: Both vertebral arteries arise from the subclavian arteries. Right vertebral artery dominant. Vertebral arteries not well assessed proximally due to motion. Visualized portions are mildly tortuous but widely patent without stenosis or occlusion. IMPRESSION: MRI HEAD IMPRESSION: 1. No acute intracranial infarct or other abnormality. 2. Generalized age advanced cerebral atrophy with mild chronic small vessel ischemic disease. 3. 8 mm meningioma overlying the left frontal convexity without associated mass effect. MRA HEAD IMPRESSION: Negative intracranial MRA with no large vessel occlusion. No hemodynamically significant or correctable stenosis. MRA NECK IMPRESSION: 1. Atheromatous change about the carotid bifurcations with estimated stenoses of up to approximately 50% by NASCET criteria bilaterally. 2. Patent vertebral arteries within the neck. Right vertebral artery dominant. Electronically Signed   By: Jeannine Boga M.D.   On: 05/19/2019 20:56   DG ERCP BILIARY & PANCREATIC DUCTS  Result Date: 05/20/2019 CLINICAL DATA:  Biliary and pancreatic ductal dilatation. EXAM: ERCP TECHNIQUE: Multiple spot images obtained with the fluoroscopic device and  submitted for interpretation post-procedure. COMPARISON:  CT of the abdomen and pelvis on 05/18/2019 FINDINGS: Imaging obtained with a C-arm during the endoscopic procedure demonstrates cannulation of the common bile duct with contrast injection demonstrating diffuse and massive dilatation of the common bile duct as well as visualized intrahepatic ducts. A self expanding metallic biliary stent was placed in the distal CBD. IMPRESSION: Biliary obstruction treated with placement of a self expanding metallic stent in the distal CBD. These images were submitted for radiologic interpretation only. Please see the procedural report for the amount of contrast and the fluoroscopy time utilized. Electronically Signed   By: Aletta Edouard M.D.   On: 05/20/2019 16:13   ECHOCARDIOGRAM COMPLETE  Result Date: 05/19/2019    ECHOCARDIOGRAM REPORT   Patient Name:   JOVONDA SELNER Date of Exam: 05/19/2019 Medical Rec #:  [id number]          Height:       67.0 in Accession #:    [id number]         Weight:       145.0 lb Date of Birth:  Apr 07, 1930          BSA:          1.764 m Patient Age:    89 years           BP:           103/58 mmHg Patient Gender: F                  HR:  92 bpm. Exam Location:  Inpatient Procedure: 2D Echo Indications:    stroke like symptom  History:        Patient has no prior history of Echocardiogram examinations.                 Risk Factors:Diabetes, Dyslipidemia and Hypertension.  Sonographer:    Johny Chess Referring Phys: [id number] TAYE T GONFA IMPRESSIONS  1. Left ventricular ejection fraction, by estimation, is 60 to 65%. The left ventricle has normal function. The left ventricle has no regional wall motion abnormalities. Left ventricular diastolic parameters were normal.  2. Right ventricular systolic function is normal. The right ventricular size is normal.  3. The mitral valve is normal in structure. Trivial mitral valve regurgitation. No evidence of mitral stenosis.  4. The aortic  valve is tricuspid. Aortic valve regurgitation is not visualized. Mild to moderate aortic valve sclerosis/calcification is present, without any evidence of aortic stenosis.  5. The inferior vena cava is normal in size with greater than 50% respiratory variability, suggesting right atrial pressure of 3 mmHg. Comparison(s): No prior Echocardiogram. FINDINGS  Left Ventricle: Left ventricular ejection fraction, by estimation, is 60 to 65%. The left ventricle has normal function. The left ventricle has no regional wall motion abnormalities. The left ventricular internal cavity size was normal in size. There is  no left ventricular hypertrophy. Left ventricular diastolic parameters were normal. Right Ventricle: The right ventricular size is normal. No increase in right ventricular wall thickness. Right ventricular systolic function is normal. Left Atrium: Left atrial size was normal in size. Right Atrium: Right atrial size was normal in size. Pericardium: There is no evidence of pericardial effusion. Mitral Valve: The mitral valve is normal in structure. Trivial mitral valve regurgitation. No evidence of mitral valve stenosis. Tricuspid Valve: The tricuspid valve is normal in structure. Tricuspid valve regurgitation is not demonstrated. No evidence of tricuspid stenosis. Aortic Valve: The aortic valve is tricuspid. . There is moderate thickening and moderate calcification of the aortic valve. Aortic valve regurgitation is not visualized. Mild to moderate aortic valve sclerosis/calcification is present, without any evidence of aortic stenosis. Moderate aortic valve annular calcification. There is moderate thickening of the aortic valve. There is moderate calcification of the aortic valve. Pulmonic Valve: The pulmonic valve was not well visualized. Pulmonic valve regurgitation is not visualized. No evidence of pulmonic stenosis. Aorta: The aortic root was not well visualized and the ascending aorta was not well visualized.  Venous: The inferior vena cava is normal in size with greater than 50% respiratory variability, suggesting right atrial pressure of 3 mmHg. IAS/Shunts: No atrial level shunt detected by color flow Doppler.  LEFT VENTRICLE PLAX 2D LVIDd:         3.00 cm  Diastology LVIDs:         2.05 cm  LV e' lateral: 11.40 cm/s LV PW:         0.80 cm  LV e' medial:  6.96 cm/s LV IVS:        0.90 cm LVOT diam:     1.70 cm LV SV:         42 LV SV Index:   24 LVOT Area:     2.27 cm  RIGHT VENTRICLE RV S prime:     12.80 cm/s TAPSE (M-mode): 1.5 cm LEFT ATRIUM             Index       RIGHT ATRIUM  Index LA diam:        2.70 cm 1.53 cm/m  RA Area:     8.60 cm LA Vol (A2C):   38.2 ml 21.66 ml/m RA Volume:   15.10 ml 8.56 ml/m LA Vol (A4C):   32.7 ml 18.54 ml/m LA Biplane Vol: 35.4 ml 20.07 ml/m  AORTIC VALVE LVOT Vmax:   104.00 cm/s LVOT Vmean:  69.400 cm/s LVOT VTI:    0.183 m  AORTA Ao Root diam: 2.90 cm  SHUNTS Systemic VTI:  0.18 m Systemic Diam: 1.70 cm Buford Dresser MD Electronically signed by Buford Dresser MD Signature Date/Time: 05/19/2019/8:44:09 PM    Final         Scheduled Meds: . aspirin  325 mg Oral Daily  . feeding supplement  1 Container Oral TID BM  . heparin  5,000 Units Subcutaneous Q8H  . insulin aspart  0-5 Units Subcutaneous QHS  . insulin aspart  0-9 Units Subcutaneous TID WC  . latanoprost  1 drop Both Eyes QHS   Continuous Infusions: . piperacillin-tazobactam (ZOSYN)  IV 3.375 g (05/20/19 0616)  . potassium chloride 10 mEq (05/20/19 1723)     LOS: 2 days    Time spent: 35 minutes    Sebastain Fishbaugh A Blanca Carreon, MD [facility]   If 7PM-7AM, please contact night-coverage [email or web address]  05/20/2019, 5:59 PM

## 2019-05-20 NOTE — Evaluation (Signed)
Physical Therapy Evaluation Patient Details Name: Debra [name] MRN: [id number] DOB: Jul 03, 1930 Today's Date: 05/20/2019   History of Present Illness  86 y.o. female with past medical history of hypertension, hypercholesterolemia, diabetes, CKD,  and memory loss admitted to [facility] for 5/3 for slurred speech, facial droop (resolved on admission), 30 lb weight loss in a year. Abdominal imaging reveals extrahepatic biliary ductal dilatation and dilation of pancreatic duct, concern for possible malignancy, cholelithiasis. CVA workup negative, but MRI reveals 8 mm meningioma overlying the left frontal convexity without associated mass effect as well as cerebral atrophy.  Clinical Impression   Pt presents with generalized LE weakness not focal upon assessment, unsteadiness in standing, mild tachycardia with mobility, and decreased activity tolerance. Pt to benefit from acute PT to address deficits. Pt ambulated short room distance this day, limited by fatigue and weakness. Per pt and family, pt uses wheelchair at times for home mobility but does walk short distances. Pt's goal is to improve strength and walk more, PT recommending HHPT to address mobility deficits and reduce fall risk upon d/c from acute setting. PT to progress mobility as tolerated, and will continue to follow acutely.      Follow Up Recommendations Home health PT;Supervision/Assistance - 24 hour    Equipment Recommendations  None recommended by PT    Recommendations for Other Services       Precautions / Restrictions Precautions Precautions: Fall Restrictions Weight Bearing Restrictions: No      Mobility  Bed Mobility Overal bed mobility: Needs Assistance Bed Mobility: Supine to Sit;Sit to Supine     Supine to sit: Min assist;HOB elevated Sit to supine: Min assist;HOB elevated   General bed mobility comments: Min assist for LE management, scooting to and from EOB with very increased time.  Transfers Overall  transfer level: Needs assistance Equipment used: Rolling walker (2 wheeled) Transfers: Sit to/from Stand Sit to Stand: Min assist;From elevated surface         General transfer comment: Min assist for power up, steadying, PT and OT stressing importance of correct hand placement when rising to avoid pulling on RW.  Ambulation/Gait Ambulation/Gait assistance: Min guard Gait Distance (Feet): 30 Feet Assistive device: Rolling walker (2 wheeled) Gait Pattern/deviations: Step-through pattern;Decreased stride length;Trendelenburg;Trunk flexed Gait velocity: decr   General Gait Details: Min guard for safety, verbal cuing for upright posture although difficult to do due to chronic kyphotic posture, placement within RW. Slow and steady gait not requiring physical steadying assist. HRmax 123 bpm during mobility.  Stairs            Wheelchair Mobility    Modified Rankin (Stroke Patients Only)       Balance Overall balance assessment: Needs assistance;History of Falls(fall 2 months ago going to bathroom at night) Sitting-balance support: No upper extremity supported;Feet supported Sitting balance-Leahy Scale: Fair     Standing balance support: Bilateral upper extremity supported;During functional activity Standing balance-Leahy Scale: Poor Standing balance comment: reliant on external assist                             Pertinent Vitals/Pain Pain Assessment: No/denies pain    Home Living Family/patient expects to be discharged to:: Private residence Living Arrangements: Children(2 sons) Available Help at Discharge: Family;Available 24 hours/day;Personal care attendant(aide 2 hours a day) Type of Home: House Home Access: Stairs to enter Entrance Stairs-Rails: Psychiatric nurse of Steps: 2 Home Layout: One level Home Equipment: Walker - 4  wheels;Cane - single point;Bedside commode;Shower seat;Wheelchair - manual      Prior Function Level of  Independence: Needs assistance   Gait / Transfers Assistance Needed: walks with a cane and assistance, leads a sedentary lifestyle and states she uses wheelchair at times but knows she shouldn't use it as much as she does  ADL's / Homemaking Assistance Needed: has an aide assist for bathing, dressing and IADL, could self feed only.        Hand Dominance   Dominant Hand: Right    Extremity/Trunk Assessment   Upper Extremity Assessment Upper Extremity Assessment: Defer to OT evaluation    Lower Extremity Assessment Lower Extremity Assessment: Generalized weakness    Cervical / Trunk Assessment Cervical / Trunk Assessment: Kyphotic  Communication   Communication: HOH  Cognition Arousal/Alertness: Awake/alert Behavior During Therapy: WFL for tasks assessed/performed Overall Cognitive Status: History of cognitive impairments - at baseline                                 General Comments: Pt with memory loss, very pleasant during session and enegaged with mobility.      General Comments      Exercises     Assessment/Plan    PT Assessment Patient needs continued PT services  PT Problem List Decreased strength;Decreased mobility;Decreased range of motion;Decreased activity tolerance;Decreased balance;Decreased knowledge of use of DME;Cardiopulmonary status limiting activity       PT Treatment Interventions DME instruction;Therapeutic activities;Gait training;Patient/family education;Therapeutic exercise;Balance training;Stair training;Functional mobility training;Neuromuscular re-education    PT Goals (Current goals can be found in the Care Plan section)  Acute Rehab PT Goals Patient Stated Goal: go home, walk more PT Goal Formulation: With patient Time For Goal Achievement: 06/03/19 Potential to Achieve Goals: Good    Frequency Min 3X/week   Barriers to discharge        Co-evaluation PT/OT/SLP Co-Evaluation/Treatment: Yes Reason for  Co-Treatment: For patient/therapist safety PT goals addressed during session: Mobility/safety with mobility;Proper use of DME OT goals addressed during session: ADL's and self-care       AM-PAC PT "6 Clicks" Mobility  Outcome Measure Help needed turning from your back to your side while in a flat bed without using bedrails?: A Little Help needed moving from lying on your back to sitting on the side of a flat bed without using bedrails?: A Little Help needed moving to and from a bed to a chair (including a wheelchair)?: A Little Help needed standing up from a chair using your arms (e.g., wheelchair or bedside chair)?: A Little Help needed to walk in hospital room?: A Little Help needed climbing 3-5 steps with a railing? : A Little 6 Click Score: 18    End of Session Equipment Utilized During Treatment: Gait belt Activity Tolerance: Patient tolerated treatment well Patient left: in bed;with call bell/phone within reach;with bed alarm set;with family/visitor present Nurse Communication: Mobility status PT Visit Diagnosis: Other abnormalities of gait and mobility (R26.89);Muscle weakness (generalized) (M62.81);History of falling (Z91.81)    Time: 8413-2440 PT Time Calculation (min) (ACUTE ONLY): 24 min   Charges:   PT Evaluation $PT Eval Low Complexity: 1 Low         Eira Alpert E, PT Acute Rehabilitation Services Pager [phone]  Office [phone]   Kelcie Currie D Lexus Barletta 05/20/2019, 11:35 AM

## 2019-05-20 NOTE — Progress Notes (Signed)
Spoke with two different MRI techs who gave two different times about pts scan. Told family at bedside that first time wasn't correct. She stated that she told everyone the time and everyone was calling. She seemed very appreciative to my conversation.

## 2019-05-20 NOTE — Evaluation (Signed)
Occupational Therapy Evaluation Patient Details Name: Debra [name] MRN: [id number] DOB: January 25, 1930 Today's Date: 05/20/2019    History of Present Illness 86 y.o. female with past medical history of hypertension, hypercholesterolemia, diabetes, CKD,  and memory loss admitted to [facility] for 5/3 for slurred speech, facial droop (resolved on admission), 30 lb weight loss in a year. Abdominal imaging reveals extrahepatic biliary ductal dilatation and dilation of pancreatic duct, concern for possible malignancy, cholelithiasis. CVA workup negative, but MRI reveals 8 mm meningioma overlying the left frontal convexity without associated mass effect as well as cerebral atrophy.   Clinical Impression   Pt self feeds and participates in grooming, but is otherwise dependent in ADL. Pt with generalized weakness and impaired balance. She leads a sedentary lifestyle. The family's goal is to improve pt's mobility. No further OT needs.    Follow Up Recommendations  No OT follow up    Equipment Recommendations  None recommended by OT    Recommendations for Other Services       Precautions / Restrictions Precautions Precautions: Fall Restrictions Weight Bearing Restrictions: No      Mobility Bed Mobility Overal bed mobility: Needs Assistance Bed Mobility: Supine to Sit;Sit to Supine     Supine to sit: Min assist;HOB elevated Sit to supine: Min assist;HOB elevated   General bed mobility comments: Min assist for LE management, scooting to and from EOB with very increased time.  Transfers Overall transfer level: Needs assistance Equipment used: Rolling walker (2 wheeled) Transfers: Sit to/from Stand Sit to Stand: Min assist;From elevated surface         General transfer comment: Min assist for power up, steadying, PT and OT stressing importance of correct hand placement when rising to avoid pulling on RW.    Balance Overall balance assessment: Needs assistance;History of  Falls Sitting-balance support: No upper extremity supported;Feet supported Sitting balance-Leahy Scale: Fair     Standing balance support: Bilateral upper extremity supported;During functional activity Standing balance-Leahy Scale: Poor Standing balance comment: reliant on external assist                           ADL either performed or assessed with clinical judgement   ADL Overall ADL's : At baseline                                             Vision Patient Visual Report: No change from baseline       Perception     Praxis      Pertinent Vitals/Pain Pain Assessment: No/denies pain     Hand Dominance Right   Extremity/Trunk Assessment Upper Extremity Assessment Upper Extremity Assessment: Generalized weakness   Lower Extremity Assessment Lower Extremity Assessment: Defer to PT evaluation   Cervical / Trunk Assessment Cervical / Trunk Assessment: Kyphotic   Communication Communication Communication: HOH   Cognition Arousal/Alertness: Awake/alert Behavior During Therapy: WFL for tasks assessed/performed Overall Cognitive Status: History of cognitive impairments - at baseline                                 General Comments: Pt with memory loss, very pleasant during session and enegaged with mobility.   General Comments       Exercises     Shoulder Instructions  Home Living Family/patient expects to be discharged to:: Private residence Living Arrangements: Children(sons) Available Help at Discharge: Family;Available 24 hours/day;Personal care attendant(aide 2 hours a day) Type of Home: House Home Access: Stairs to enter [employer] of Steps: 2 Entrance Stairs-Rails: Right;Left Home Layout: One level     Bathroom Shower/Tub: Teacher, early years/pre: Standard     Home Equipment: Environmental consultant - 4 wheels;Cane - single point;Bedside commode;Shower seat;Wheelchair - manual           Prior Functioning/Environment Level of Independence: Needs assistance  Gait / Transfers Assistance Needed: walks with a cane and assistance, leads a sedentary lifestyle and states she uses wheelchair at times but knows she shouldn't use it as much as she does ADL's / Homemaking Assistance Needed: has an aide assist for bathing, dressing and IADL, could self feed only.            OT Problem List:        OT Treatment/Interventions:      OT Goals(Current goals can be found in the care plan section) Acute Rehab OT Goals Patient Stated Goal: go home, walk more  OT Frequency:     Barriers to D/C:            Co-evaluation PT/OT/SLP Co-Evaluation/Treatment: Yes Reason for Co-Treatment: For patient/therapist safety PT goals addressed during session: Mobility/safety with mobility;Proper use of DME OT goals addressed during session: ADL's and self-care;Proper use of Adaptive equipment and DME      AM-PAC OT "6 Clicks" Daily Activity     Outcome Measure Help from another person eating meals?: None Help from another person taking care of personal grooming?: A Little Help from another person toileting, which includes using toliet, bedpan, or urinal?: A Lot Help from another person bathing (including washing, rinsing, drying)?: A Lot Help from another person to put on and taking off regular upper body clothing?: A Little Help from another person to put on and taking off regular lower body clothing?: A Lot 6 Click Score: 16   End of Session Equipment Utilized During Treatment: Gait belt;Rolling walker  Activity Tolerance: Patient tolerated treatment well Patient left: in bed;with call bell/phone within reach;with family/visitor present;with bed alarm set  OT Visit Diagnosis: Unsteadiness on feet (R26.81);Other abnormalities of gait and mobility (R26.89);Muscle weakness (generalized) (M62.81);Cognitive communication deficit (R41.841)                Time: 6195-0932 OT Time Calculation  (min): 24 min Charges:  OT General Charges $OT Visit: 1 Visit OT Evaluation $OT Eval Moderate Complexity: 1 Mod  Nestor Lewandowsky, OTR/L Acute Rehabilitation Services Pager: [phone] Office: [phone] Malka So 05/20/2019, 1:12 PM

## 2019-05-20 NOTE — Social Work (Signed)
CSW was unable to complete sbirt due to pt being at a procedure. CSW may attempt to complete at more appropriate time.   Jarome Trull, LCSWA, LCASA Clinical Social Worker [phone]    

## 2019-05-20 NOTE — Interval H&P Note (Signed)
History and Physical Interval Note:  05/20/2019 2:47 PM  Debra [name]  has presented today for surgery, with the diagnosis of Intra and extrahepatic ductal dilatation, pancreatic ductal dilatation.  The various methods of treatment have been discussed with the patient and family. After consideration of risks, benefits and other options for treatment, the patient has consented to  Procedure(s): ENDOSCOPIC RETROGRADE CHOLANGIOPANCREATOGRAPHY (ERCP) (N/A) Endoscopic ultrasound (N/A) as a surgical intervention.  The patient's history has been reviewed, patient examined, no change in status, stable for surgery.  I have reviewed the patient's chart and labs.  Questions were answered to the patient's satisfaction.     Milus Banister

## 2019-05-20 NOTE — Op Note (Signed)
[facility] Patient Name: Debra [name] Procedure Date : 05/20/2019 MRN: [id number] Attending MD: Milus Banister , MD Date of Birth: [birth year] CSN: [id number] Age: 86 Admit Type: Inpatient Procedure:                ERCP Indications:              Dilated pancreatic and biliary ducts, elevated                            liver tests, elevated WBC at admission. No mass in                            pancreas on CT Providers:                Milus Banister, MD, Carlyn Reichert, RN, Lazaro Arms, Technician Referring MD:              Medicines:                General Anesthesia, Indomethacin 100 mg PR, zosyn                            IV 3.375mg  Complications:            No immediate complications. Estimated blood loss:                            None Estimated Blood Loss:     Estimated blood loss: none. Procedure:                Pre-Anesthesia Assessment:                           - Prior to the procedure, a History and Physical                            was performed, and patient medications and                            allergies were reviewed. The patient's tolerance of                            previous anesthesia was also reviewed. The risks                            and benefits of the procedure and the sedation                            options and risks were discussed with the patient.                            All questions were answered, and informed consent                            was obtained. Prior  Anticoagulants: The patient has                            taken no previous anticoagulant or antiplatelet                            agents. ASA Grade Assessment: III - A patient with                            severe systemic disease. After reviewing the risks                            and benefits, the patient was deemed in                            satisfactory condition to undergo the procedure.  After obtaining informed consent, the scope was                            passed under direct vision. Throughout the                            procedure, the patient's blood pressure, pulse, and                            oxygen saturations were monitored continuously. The                            [id number]-[id number] ([id number] Olympus Duodensocope was                            introduced through the mouth, and used to inject                            contrast into and used to inject contrast into the                            bile duct. The ERCP was accomplished without                            difficulty. The patient tolerated the procedure                            well. Scope In: Scope Out: Findings:      The duodenoscope was advanced to the region of the major papilla without       detailed examination of the UGI tract. There was an ulcerated 2-3cm mass       at the major papilla that was very suspicious for a malignancy. I       sampled the lesion with forceps extensively. The ampullary orifice was       obliterated however by gentle, persistent probing with a .035 hydrawire       through a 44 [employer] I was able to locate and cannulate the major       papilla and inject contrast. Cholangiogram  revealed a short (1cm long),       abrupt stricture at the most distal aspect of the CBD. The extrahepatic       biliary tree proximal to the stricture was diffusely dilated (CBD 76mm).       The cystic duct did not opacify. Only a small amount of contrast filled       the distal hepatic ducts. There was copious purulence that drained from       the bile duct into the duoenum. I placed a 4cm long 40mm diameter       uncovered metal mesh stent across the ampullary/distal CBD stricture in       good position (the distal-most 7-49mm) of the stent extended into the       duodenum. The main pancreatic duct was never cannulated with the wire or       injected with dye. 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 This was biopsied extensively.                           - The mass causes a short, abrupt distal CBD                            stricture which I stented with a 4cm long 72mm                            diameter uncovered metal mesh stent.                           - Purulent cholangitis noted. Recommendation:           - Return patient to hospital ward for ongoing care.                           - Follow her liver tests, would continue another                            5-7 days of antibiotics given the purulent                            cholangitis.                           - Await final pathology results from the ampullary                            mass. Procedure Code(s):        --- Professional ---                           [phone], Endoscopic retrograde  cholangiopancreatography (ERCP); with placement of                            endoscopic stent into biliary or pancreatic duct,                            including pre- and post-dilation and guide wire                            passage, when performed, including sphincterotomy,                            when performed, each stent                           43261, 59, Endoscopic retrograde                            cholangiopancreatography (ERCP); with biopsy,                            single or multiple Diagnosis Code(s):        --- Professional ---                           K83.1, Obstruction of bile duct CPT copyright 2019 American [employer]. All rights reserved. The codes documented in this report are preliminary and upon coder review may  be revised to meet current compliance requirements. Milus Banister, MD 05/20/2019 4:27:30 PM This report has been signed electronically. Number of Addenda: 0

## 2019-05-21 ENCOUNTER — Telehealth: Payer: Self-pay

## 2019-05-21 ENCOUNTER — Inpatient Hospital Stay (HOSPITAL_COMMUNITY): Payer: Medicare Other

## 2019-05-21 DIAGNOSIS — C241 Malignant neoplasm of ampulla of Vater: Principal | ICD-10-CM

## 2019-05-21 DIAGNOSIS — C801 Malignant (primary) neoplasm, unspecified: Secondary | ICD-10-CM

## 2019-05-21 DIAGNOSIS — G459 Transient cerebral ischemic attack, unspecified: Secondary | ICD-10-CM | POA: Diagnosis not present

## 2019-05-21 LAB — CBC
HCT: 25.8 % — ABNORMAL LOW (ref 36.0–46.0)
Hemoglobin: 8.5 g/dL — ABNORMAL LOW (ref 12.0–15.0)
MCH: 30.2 pg (ref 26.0–34.0)
MCHC: 32.9 g/dL (ref 30.0–36.0)
MCV: 91.8 fL (ref 80.0–100.0)
Platelets: 441 10*3/uL — ABNORMAL HIGH (ref 150–400)
RBC: 2.81 MIL/uL — ABNORMAL LOW (ref 3.87–5.11)
RDW: 13.9 % (ref 11.5–15.5)
WBC: 5.2 10*3/uL (ref 4.0–10.5)
nRBC: 0 % (ref 0.0–0.2)

## 2019-05-21 LAB — HEPATIC FUNCTION PANEL
ALT: 149 U/L — ABNORMAL HIGH (ref 0–44)
AST: 314 U/L — ABNORMAL HIGH (ref 15–41)
Albumin: 1.2 g/dL — ABNORMAL LOW (ref 3.5–5.0)
Alkaline Phosphatase: 595 U/L — ABNORMAL HIGH (ref 38–126)
Bilirubin, Direct: 3.3 mg/dL — ABNORMAL HIGH (ref 0.0–0.2)
Indirect Bilirubin: 2.4 mg/dL — ABNORMAL HIGH (ref 0.3–0.9)
Total Bilirubin: 5.7 mg/dL — ABNORMAL HIGH (ref 0.3–1.2)
Total Protein: 5.6 g/dL — ABNORMAL LOW (ref 6.5–8.1)

## 2019-05-21 LAB — BASIC METABOLIC PANEL
Anion gap: 12 (ref 5–15)
BUN: 14 mg/dL (ref 8–23)
CO2: 18 mmol/L — ABNORMAL LOW (ref 22–32)
Calcium: 8 mg/dL — ABNORMAL LOW (ref 8.9–10.3)
Chloride: 102 mmol/L (ref 98–111)
Creatinine, Ser: 1.54 mg/dL — ABNORMAL HIGH (ref 0.44–1.00)
GFR calc Af Amer: 35 mL/min — ABNORMAL LOW (ref >60)
GFR calc non Af Amer: 30 mL/min — ABNORMAL LOW (ref >60)
Glucose, Bld: 176 mg/dL — ABNORMAL HIGH (ref 70–99)
Potassium: 4 mmol/L (ref 3.5–5.1)
Sodium: 132 mmol/L — ABNORMAL LOW (ref 135–145)

## 2019-05-21 LAB — GLUCOSE, CAPILLARY
Glucose-Capillary: 169 mg/dL — ABNORMAL HIGH (ref 70–99)
Glucose-Capillary: 162 mg/dL — ABNORMAL HIGH (ref 70–99)
Glucose-Capillary: 242 mg/dL — ABNORMAL HIGH (ref 70–99)
Glucose-Capillary: 322 mg/dL — ABNORMAL HIGH (ref 70–99)
Glucose-Capillary: 261 mg/dL — ABNORMAL HIGH (ref 70–99)

## 2019-05-21 LAB — HEMOGLOBIN AND HEMATOCRIT, BLOOD
HCT: 23.7 % — ABNORMAL LOW (ref 36.0–46.0)
Hemoglobin: 8 g/dL — ABNORMAL LOW (ref 12.0–15.0)

## 2019-05-21 LAB — LACTIC ACID, PLASMA
Lactic Acid, Venous: 2.4 mmol/L (ref 0.5–1.9)
Lactic Acid, Venous: 3 mmol/L (ref 0.5–1.9)

## 2019-05-21 LAB — TYPE AND SCREEN
ABO/RH(D): A NEG
Antibody Screen: NEGATIVE

## 2019-05-21 LAB — MAGNESIUM: Magnesium: 1.6 mg/dL — ABNORMAL LOW (ref 1.7–2.4)

## 2019-05-21 LAB — ABO/RH: ABO/RH(D): A NEG

## 2019-05-21 MED ORDER — SODIUM CHLORIDE 0.9 % IV BOLUS
1000.0000 mL | Freq: Once | INTRAVENOUS | Status: AC
  Administered 2019-05-21: 1000 mL via INTRAVENOUS

## 2019-05-21 MED ORDER — SODIUM CHLORIDE 0.9 % IV BOLUS
250.0000 mL | Freq: Once | INTRAVENOUS | Status: AC
  Administered 2019-05-21: 250 mL via INTRAVENOUS

## 2019-05-21 MED ORDER — ENSURE ENLIVE PO LIQD
237.0000 mL | Freq: Three times a day (TID) | ORAL | Status: DC
  Administered 2019-05-21 (×2): 237 mL via ORAL

## 2019-05-21 MED ORDER — MAGNESIUM SULFATE 2 GM/50ML IV SOLN
2.0000 g | Freq: Once | INTRAVENOUS | Status: AC
  Administered 2019-05-21: 2 g via INTRAVENOUS
  Filled 2019-05-21: qty 50

## 2019-05-21 NOTE — Progress Notes (Signed)
Pt daughter would prefer pt not be given Ensures. She reports they appear to cause elevated blood sugars after drinking.

## 2019-05-21 NOTE — Progress Notes (Signed)
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  I was not able to reach her daughter however I spoke with her son Herbie [place] about final pathology.  [facility] will arrange outpatient referral to medical oncology, they prefer it to be at the [place] rather than [facility].

## 2019-05-21 NOTE — Progress Notes (Signed)
Pt c/o nausea after drinking Strawberry flavored Ensure. Zofran administered.

## 2019-05-21 NOTE — Progress Notes (Signed)
CRITICAL VALUE ALERT  Critical Value:  Lactic Acid 2.4  Date & Time Notied:  1720  05/21/2019  Provider Notified: Tyrell Antonio, MD  Orders Received/Actions taken: awaiting orders
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
## 2019-05-21 NOTE — Progress Notes (Addendum)
PROGRESS NOTE    Debra [name]  EHU:[id number] DOB: [birth year] DOA: 05/18/2019 PCP: Asencion Noble, MD   Brief Narrative: 86 year old with past medical history significant for diabetes, hypertension, hyperlipidemia presented to [facility], ED with a slurry speech, left facial droop, Johnson's and unintentional weight loss.  Reportedly had a slurred speech and left facial droop the morning of presentation that lasted about 30 minutes.  Family also noticed jaundice.  Reportedly had about 30 pounds weight loss over the last year.  Patient was found to have transaminases, elevated lipase 1132, CT head without acute finding.  CT abdomen and pelvis show pancreatic duct and intra and extrahepatic biliary duct dilation and cholelithiasis.  Sequently patient complaining of right upper arm weakness, reported to be new per family.  Neurology was consulted CVA work-up initiated.  Assessment & Plan:   Active Problems:   Dilation of biliary tract   Gallstone pancreatitis   Pancreatic duct dilated   Lactic acidosis   Acute cholangitis   Unintentional weight loss   Slurred speech   HLD (hyperlipidemia)   HTN (hypertension)   DM (diabetes mellitus), type 2 (HCC)   Hypokalemia   TIA (transient ischemic attack)   Protein-calorie malnutrition, severe  1-Obstructive jaundice, acute pancreatitis; ampullary mass, cholangitis, hyperbilirubinemia,  Preliminary path report poorly differentiated adenocarcinoma  patient presented with lipase in the 1000 range. She presented also with obstructive jaundice.  CT with profound intra and extrahepatic biliary duct dilation and slightly increased pancreatic duct dilation.  Concern about a small ampullary or pancreatic malignancy based on imaging. 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  Biopsy obtained.  The mass calluses ensured a broad distal CBD stricture which stent was  placed cholangitis noted. Continue with Zosyn GI is recommending 5 to 7 days of antibiotics given purulent cholangitis. Transition to Augmentin at discharge.  Dr Edison Nasuti place referral to oncology , out patient.  LFT trending down.  Hypotension; Check lactic acid. IV bolus.  Repeated hb stable.   2-right upper extremity weakness/transient left facial droop and slurred speech CVA work-up ; MRI head: No acute intracranial abnormality, mild chronic small vessel disease, 8 mm meningioma.  MR a head; atheromatous change about the carotid bifurcation stenosis of approximately 50% by NASCET criteria bilaterally Aspirin daily Need to start statin when liver function test improved Neurology following.  Diabetes: Continue with a sliding scale insulin Hypertension: Permissive hypertension continue with fluid Lactic acidosis: Related to cholangitis.  Continue with IV fluids and IV antibiotics. AKI; increase cr to 1.5. increase IV fluids.  HypoKalemia: Resolved.  Metabolic acidosis; continue with IV fluid. Hypomagnesemia; continue with IV magnesium supplement.  Normocytic anemia: Follow Mild Hyponatremia; continue with IV fluids.   Nutrition Problem: Severe Malnutrition Etiology: chronic illness    Signs/Symptoms: severe fat depletion, severe muscle depletion, moderate fat depletion, moderate muscle depletion    Interventions: Refer to RD note for recommendations, Boost Breeze  Estimated body mass index is 22.71 kg/m as calculated from the following:   Height as of this encounter: 5\' 7"  (1.702 m).   Weight as of this encounter: 65.8 kg.   DVT prophylaxis: SCDs Code Status: Full code Family Communication: Care discussed with patient Disposition Plan:  Patient is from: Home Anticipated d/c date: 2 or 3 days after resolution of pancreatitis and obstructive jaundice. Anticipate to discharge home Barriers to d/c or necessity for inpatient status: Plan for ERCP today  Consultants:  GI  Neurology  Procedures:   ERCP  Antimicrobials:  Zosyn  Subjective: She denies pain, she was able to eat breakfast.   Objective: Vitals:   05/21/19 0402 05/21/19 0805 05/21/19 1152 05/21/19 1157  BP: (!) 98/56 (!) 105/49 (!) 95/51   Pulse: 73     Resp: 17 (!) 21 12 18   Temp: 98.2 F (36.8 C) 98.2 F (36.8 C)    TempSrc: Oral Oral    SpO2: 98% 98% 99%   Weight:      Height:        Intake/Output Summary (Last 24 hours) at 05/21/2019 1201 Last data filed at 05/21/2019 4970 Gross per 24 hour  Intake 1397.61 ml  Output --  Net 1397.61 ml   Filed Weights   05/18/19 0903  Weight: 65.8 kg    Examination:  General exam: NAD, Jaundice Respiratory system: CTA Cardiovascular system: S 1, S 2 RRR Gastrointestinal system: BS present, soft, nt Central nervous system: Alert, conversant Extremities: Symmetric power.     Data Reviewed: I have personally reviewed following labs and imaging studies  CBC: Recent Labs  Lab 05/18/19 0941 05/18/19 0941 05/18/19 2258 05/19/19 0409 05/20/19 0420 05/20/19 1449 05/21/19 0346  WBC 8.2  --  8.4 7.9 7.1  --  5.2  NEUTROABS 6.1  --   --   --  4.8  --   --   HGB 8.6*   < > 9.5* 9.2* 9.7* 10.5* 8.5*  HCT 26.1*   < > 28.0* 26.8* 28.2* 31.0* 25.8*  MCV 92.6  --  90.3 89.6 89.5  --  91.8  PLT 499*  --  601* 538* 513*  --  441*   < > = values in this interval not displayed.   Basic Metabolic Panel: Recent Labs  Lab 05/18/19 0941 05/18/19 2258 05/19/19 0409 05/20/19 0420 05/20/19 1212 05/20/19 1449 05/21/19 0346  NA 131*  --  131* 131* 132* 133* 132*  K 3.1*  --  3.2* 2.8* 3.3* 3.6 4.0  CL 95*  --  100 98 97* 101 102  CO2 23  --  19* 19* 21*  --  18*  GLUCOSE 209*  --  119* 94 92 81 176*  BUN 25*  --  15 13 13 13 14   CREATININE 1.49*   < > 1.41* 1.39* 1.34* 1.20* 1.54*  CALCIUM 8.0*  --  8.0* 8.2* 8.1*  --  8.0*  MG 1.7  --   --  1.4* 1.6*  --  1.6*  PHOS  --   --   --  3.4  --   --   --    < > = values in this  interval not displayed.   GFR: Estimated Creatinine Clearance: 25 mL/min (A) (by C-G formula based on SCr of 1.54 mg/dL (H)). Liver Function Tests: Recent Labs  Lab 05/18/19 0941 05/19/19 0409 05/19/19 1515 05/20/19 0420 05/21/19 0346  AST 478* 463*  --  491* 314*  ALT 175* 179*  --  181* 149*  ALKPHOS 652* 607*  --  701* 595*  BILITOT 6.9* 7.4* 8.0* 8.3* 5.7*  PROT 5.7* 6.0*  --  5.9* 5.6*  ALBUMIN 1.7* 1.5*  --  1.4* 1.2*   Recent Labs  Lab 05/18/19 0941 05/19/19 0409 05/20/19 0420  LIPASE 1,132* 996* 602*   No results for input(s): AMMONIA in the last 168 hours. Coagulation Profile: Recent Labs  Lab 05/18/19 0941 05/19/19 0409  INR 1.3* 1.2   Cardiac Enzymes: Recent  Labs  Lab 05/19/19 1515  CKTOTAL 550*   BNP (last 3 results) No results for input(s): PROBNP in the last 8760 hours. HbA1C: Recent Labs    05/18/19 2258 05/20/19 0420  HGBA1C 6.1* 6.1*   CBG: Recent Labs  Lab 05/20/19 1620 05/20/19 2111 05/21/19 0230 05/21/19 0807 05/21/19 1125  GLUCAP 89 135* 169* 162* 242*   Lipid Profile: Recent Labs    05/20/19 0420  CHOL 269*  HDL <10*  LDLCALC NOT CALCULATED  TRIG 189*  CHOLHDL NOT CALCULATED   Thyroid Function Tests: Recent Labs    05/19/19 1519  TSH 4.886*   Anemia Panel: Recent Labs    05/19/19 1515 05/19/19 1519  VITAMINB12 4,543*  --   FOLATE  --  14.9  FERRITIN 771*  --   TIBC 137*  --   IRON 46  --   RETICCTPCT  --  2.0   Sepsis Labs: Recent Labs  Lab 05/18/19 1136 05/18/19 1658 05/19/19 0844 05/19/19 1126  LATICACIDVEN 4.2* 3.0* 1.5 1.5    Recent Results (from the past 240 hour(s))  Respiratory Panel by RT PCR (Flu A&B, Covid) - Urine, Catheterized     Status: None   Collection Time: 05/18/19 10:47 AM   Specimen: Urine, Catheterized  Result Value Ref Range Status   SARS Coronavirus 2 by RT PCR NEGATIVE NEGATIVE Final    Comment: (NOTE) SARS-CoV-2 target nucleic acids are NOT DETECTED. The SARS-CoV-2  RNA is generally detectable in upper respiratoy specimens during the acute phase of infection. The lowest concentration of SARS-CoV-2 viral copies this assay can detect is 131 copies/mL. A negative result does not preclude SARS-Cov-2 infection and should not be used as the sole basis for treatment or other patient management decisions. A negative result may occur with  improper specimen collection/handling, submission of specimen other than nasopharyngeal swab, presence of viral mutation(s) within the areas targeted by this assay, and inadequate number of viral copies (<131 copies/mL). A negative result must be combined with clinical observations, patient history, and epidemiological information. The expected result is Negative. Fact Sheet for Patients:  PinkCheek.be Fact Sheet for Healthcare Providers:  GravelBags.it This test is not yet ap proved or cleared by the [place] FDA and  has been authorized for detection and/or diagnosis of SARS-CoV-2 by FDA under an Emergency Use Authorization (EUA). This EUA will remain  in effect (meaning this test can be used) for the duration of the COVID-19 declaration under Section 564(b)(1) of the Act, 21 U.S.C. section 360bbb-3(b)(1), unless the authorization is terminated or revoked sooner.    Influenza A by PCR NEGATIVE NEGATIVE Final   Influenza B by PCR NEGATIVE NEGATIVE Final    Comment: (NOTE) The Xpert Xpress SARS-CoV-2/FLU/RSV assay is intended as an aid in  the diagnosis of influenza from Nasopharyngeal swab specimens and  should not be used as a sole basis for treatment. Nasal washings and  aspirates are unacceptable for Xpert Xpress SARS-CoV-2/FLU/RSV  testing. Fact Sheet for Patients: PinkCheek.be Fact Sheet for Healthcare Providers: GravelBags.it This test is not yet approved or cleared by the [place] FDA  and  has been authorized for detection and/or diagnosis of SARS-CoV-2 by  FDA under an Emergency Use Authorization (EUA). This EUA will remain  in effect (meaning this test can be used) for the duration of the  Covid-19 declaration under Section 564(b)(1) of the Act, 21  U.S.C. section 360bbb-3(b)(1), unless the authorization is  terminated or revoked. Performed at [facility], [place]  [address], [place], [place] [address]   Urine Culture     Status: Abnormal   Collection Time: 05/18/19 11:07 AM   Specimen: Urine, Clean Catch  Result Value Ref Range Status   Specimen Description   Final    URINE, CLEAN CATCH Performed at [facility], [address] [place], [place] [address]    Special Requests   Final    NONE Performed at [facility], [address] [place], [place] [address]    Culture (A)  Final    <10,000 COLONIES/mL INSIGNIFICANT GROWTH Performed at [facility] [address], [place], [place] [address]    Report Status 05/19/2019 FINAL  Final         Radiology Studies: MR ANGIO HEAD WO CONTRAST  Result Date: 05/19/2019 CLINICAL DATA:  Initial evaluation for acute slurred speech, left-sided facial droop. EXAM: MR HEAD WITHOUT CONTRAST MR CIRCLE OF WILLIS WITHOUT CONTRAST MRA OF THE NECK WITHOUT AND WITH CONTRAST TECHNIQUE: Multiplanar, multiecho pulse sequences of the brain, circle of Willis and surrounding structures were obtained without intravenous contrast. Angiographic images of the neck were obtained using MRA technique without and with intravenous contrast. CONTRAST:  6.5 cc of Gadavist. COMPARISON:  Prior head CT from 05/18/2019. FINDINGS: MRI HEAD FINDINGS Brain: Diffuse prominence of the CSF containing spaces compatible with generalized age-related cerebral atrophy. Patchy and confluent T2/FLAIR hyperintensity within the periventricular and deep white matter both cerebral hemispheres most consistent with chronic small vessel ischemic disease, mild for age. No  abnormal foci of restricted diffusion to suggest acute or subacute ischemia. Gray-white matter differentiation maintained. No encephalomalacia to suggest chronic cortical infarction. No evidence for acute or chronic intracranial hemorrhage. 8 mm meningioma overlies the left frontal convexity without associated mass effect (series 6, image 15). No other mass lesion, mass effect, or midline shift. Diffuse ventricular prominence related to global parenchymal volume loss without hydrocephalus. No extra-axial fluid collection. Note made of a partially empty sella. Midline structures intact. Vascular: Major intracranial vascular flow voids are maintained. Skull and upper cervical spine: Craniocervical junction within normal limits. Multilevel degenerative spondylolysis noted within the upper cervical spine with resultant moderate spinal stenosis at the level of C3-4. Bone marrow signal intensity within normal limits. No focal marrow replacing lesion. No scalp soft tissue abnormality. Sinuses/Orbits: Patient status post bilateral ocular lens replacement. Small amount of pneumatized secretions noted within the left sphenoid sinus. Paranasal sinuses are otherwise largely clear. No significant mastoid effusion. Inner ear structures grossly normal. Other: None. MRA HEAD FINDINGS ANTERIOR CIRCULATION: Visualized distal cervical segments of the internal carotid arteries are patent with symmetric antegrade flow. Petrous, cavernous, and supraclinoid ICAs patent without flow-limiting stenosis. A1 segments widely patent. Normal anterior communicating artery complex. Anterior cerebral arteries patent to their distal aspects without stenosis. No M1 stenosis or occlusion. Normal MCA bifurcations. Distal MCA branches well perfused and symmetric. POSTERIOR CIRCULATION: Partially visualized vertebral arteries widely patent. Right vertebral artery dominant. Both picas patent. Basilar patent to its distal aspect without stenosis. Superior  cerebral arteries patent bilaterally. Right PCA supplied via the basilar. Predominant fetal type origin of the left PCA. Both PCAs well perfused to their distal aspects. No intracranial aneurysm. MRA NECK FINDINGS: AORTIC ARCH: Examination degraded by motion artifact. Visualized aortic arch of normal caliber with normal 3 vessel morphology. No flow-limiting stenosis about the origin of the great vessels. Visualized subclavian arteries widely patent. RIGHT CAROTID SYSTEM: Right CCA tortuous but widely patent to the bifurcation without stenosis. Atheromatous irregularity about the  right carotid bifurcation with associated stenosis of up to approximate 50% by NASCET criteria. Right ICA patent distally to the skull base without stenosis or occlusion. LEFT CAROTID SYSTEM: Left CCA patent from its origin to the bifurcation without stenosis. Atheromatous irregularity about the left carotid bifurcation with associated stenosis of up to approximately 50% by NASCET criteria. Left ICA otherwise patent distally to the skull base without stenosis or occlusion. VERTEBRAL ARTERIES: Both vertebral arteries arise from the subclavian arteries. Right vertebral artery dominant. Vertebral arteries not well assessed proximally due to motion. Visualized portions are mildly tortuous but widely patent without stenosis or occlusion. IMPRESSION: MRI HEAD IMPRESSION: 1. No acute intracranial infarct or other abnormality. 2. Generalized age advanced cerebral atrophy with mild chronic small vessel ischemic disease. 3. 8 mm meningioma overlying the left frontal convexity without associated mass effect. MRA HEAD IMPRESSION: Negative intracranial MRA with no large vessel occlusion. No hemodynamically significant or correctable stenosis. MRA NECK IMPRESSION: 1. Atheromatous change about the carotid bifurcations with estimated stenoses of up to approximately 50% by NASCET criteria bilaterally. 2. Patent vertebral arteries within the neck. Right  vertebral artery dominant. Electronically Signed   By: Jeannine Boga M.D.   On: 05/19/2019 20:56   MR ANGIO NECK W WO CONTRAST  Result Date: 05/19/2019 CLINICAL DATA:  Initial evaluation for acute slurred speech, left-sided facial droop. EXAM: MR HEAD WITHOUT CONTRAST MR CIRCLE OF WILLIS WITHOUT CONTRAST MRA OF THE NECK WITHOUT AND WITH CONTRAST TECHNIQUE: Multiplanar, multiecho pulse sequences of the brain, circle of Willis and surrounding structures were obtained without intravenous contrast. Angiographic images of the neck were obtained using MRA technique without and with intravenous contrast. CONTRAST:  6.5 cc of Gadavist. COMPARISON:  Prior head CT from 05/18/2019. FINDINGS: MRI HEAD FINDINGS Brain: Diffuse prominence of the CSF containing spaces compatible with generalized age-related cerebral atrophy. Patchy and confluent T2/FLAIR hyperintensity within the periventricular and deep white matter both cerebral hemispheres most consistent with chronic small vessel ischemic disease, mild for age. No abnormal foci of restricted diffusion to suggest acute or subacute ischemia. Gray-white matter differentiation maintained. No encephalomalacia to suggest chronic cortical infarction. No evidence for acute or chronic intracranial hemorrhage. 8 mm meningioma overlies the left frontal convexity without associated mass effect (series 6, image 15). No other mass lesion, mass effect, or midline shift. Diffuse ventricular prominence related to global parenchymal volume loss without hydrocephalus. No extra-axial fluid collection. Note made of a partially empty sella. Midline structures intact. Vascular: Major intracranial vascular flow voids are maintained. Skull and upper cervical spine: Craniocervical junction within normal limits. Multilevel degenerative spondylolysis noted within the upper cervical spine with resultant moderate spinal stenosis at the level of C3-4. Bone marrow signal intensity within normal  limits. No focal marrow replacing lesion. No scalp soft tissue abnormality. Sinuses/Orbits: Patient status post bilateral ocular lens replacement. Small amount of pneumatized secretions noted within the left sphenoid sinus. Paranasal sinuses are otherwise largely clear. No significant mastoid effusion. Inner ear structures grossly normal. Other: None. MRA HEAD FINDINGS ANTERIOR CIRCULATION: Visualized distal cervical segments of the internal carotid arteries are patent with symmetric antegrade flow. Petrous, cavernous, and supraclinoid ICAs patent without flow-limiting stenosis. A1 segments widely patent. Normal anterior communicating artery complex. Anterior cerebral arteries patent to their distal aspects without stenosis. No M1 stenosis or occlusion. Normal MCA bifurcations. Distal MCA branches well perfused and symmetric. POSTERIOR CIRCULATION: Partially visualized vertebral arteries widely patent. Right vertebral artery dominant. Both picas patent. Basilar patent to its distal aspect without  stenosis. Superior cerebral arteries patent bilaterally. Right PCA supplied via the basilar. Predominant fetal type origin of the left PCA. Both PCAs well perfused to their distal aspects. No intracranial aneurysm. MRA NECK FINDINGS: AORTIC ARCH: Examination degraded by motion artifact. Visualized aortic arch of normal caliber with normal 3 vessel morphology. No flow-limiting stenosis about the origin of the great vessels. Visualized subclavian arteries widely patent. RIGHT CAROTID SYSTEM: Right CCA tortuous but widely patent to the bifurcation without stenosis. Atheromatous irregularity about the right carotid bifurcation with associated stenosis of up to approximate 50% by NASCET criteria. Right ICA patent distally to the skull base without stenosis or occlusion. LEFT CAROTID SYSTEM: Left CCA patent from its origin to the bifurcation without stenosis. Atheromatous irregularity about the left carotid bifurcation with  associated stenosis of up to approximately 50% by NASCET criteria. Left ICA otherwise patent distally to the skull base without stenosis or occlusion. VERTEBRAL ARTERIES: Both vertebral arteries arise from the subclavian arteries. Right vertebral artery dominant. Vertebral arteries not well assessed proximally due to motion. Visualized portions are mildly tortuous but widely patent without stenosis or occlusion. IMPRESSION: MRI HEAD IMPRESSION: 1. No acute intracranial infarct or other abnormality. 2. Generalized age advanced cerebral atrophy with mild chronic small vessel ischemic disease. 3. 8 mm meningioma overlying the left frontal convexity without associated mass effect. MRA HEAD IMPRESSION: Negative intracranial MRA with no large vessel occlusion. No hemodynamically significant or correctable stenosis. MRA NECK IMPRESSION: 1. Atheromatous change about the carotid bifurcations with estimated stenoses of up to approximately 50% by NASCET criteria bilaterally. 2. Patent vertebral arteries within the neck. Right vertebral artery dominant. Electronically Signed   By: Jeannine Boga M.D.   On: 05/19/2019 20:56   MR BRAIN WO CONTRAST  Result Date: 05/19/2019 CLINICAL DATA:  Initial evaluation for acute slurred speech, left-sided facial droop. EXAM: MR HEAD WITHOUT CONTRAST MR CIRCLE OF WILLIS WITHOUT CONTRAST MRA OF THE NECK WITHOUT AND WITH CONTRAST TECHNIQUE: Multiplanar, multiecho pulse sequences of the brain, circle of Willis and surrounding structures were obtained without intravenous contrast. Angiographic images of the neck were obtained using MRA technique without and with intravenous contrast. CONTRAST:  6.5 cc of Gadavist. COMPARISON:  Prior head CT from 05/18/2019. FINDINGS: MRI HEAD FINDINGS Brain: Diffuse prominence of the CSF containing spaces compatible with generalized age-related cerebral atrophy. Patchy and confluent T2/FLAIR hyperintensity within the periventricular and deep white matter  both cerebral hemispheres most consistent with chronic small vessel ischemic disease, mild for age. No abnormal foci of restricted diffusion to suggest acute or subacute ischemia. Gray-white matter differentiation maintained. No encephalomalacia to suggest chronic cortical infarction. No evidence for acute or chronic intracranial hemorrhage. 8 mm meningioma overlies the left frontal convexity without associated mass effect (series 6, image 15). No other mass lesion, mass effect, or midline shift. Diffuse ventricular prominence related to global parenchymal volume loss without hydrocephalus. No extra-axial fluid collection. Note made of a partially empty sella. Midline structures intact. Vascular: Major intracranial vascular flow voids are maintained. Skull and upper cervical spine: Craniocervical junction within normal limits. Multilevel degenerative spondylolysis noted within the upper cervical spine with resultant moderate spinal stenosis at the level of C3-4. Bone marrow signal intensity within normal limits. No focal marrow replacing lesion. No scalp soft tissue abnormality. Sinuses/Orbits: Patient status post bilateral ocular lens replacement. Small amount of pneumatized secretions noted within the left sphenoid sinus. Paranasal sinuses are otherwise largely clear. No significant mastoid effusion. Inner ear structures grossly normal. Other: None. MRA HEAD  FINDINGS ANTERIOR CIRCULATION: Visualized distal cervical segments of the internal carotid arteries are patent with symmetric antegrade flow. Petrous, cavernous, and supraclinoid ICAs patent without flow-limiting stenosis. A1 segments widely patent. Normal anterior communicating artery complex. Anterior cerebral arteries patent to their distal aspects without stenosis. No M1 stenosis or occlusion. Normal MCA bifurcations. Distal MCA branches well perfused and symmetric. POSTERIOR CIRCULATION: Partially visualized vertebral arteries widely patent. Right  vertebral artery dominant. Both picas patent. Basilar patent to its distal aspect without stenosis. Superior cerebral arteries patent bilaterally. Right PCA supplied via the basilar. Predominant fetal type origin of the left PCA. Both PCAs well perfused to their distal aspects. No intracranial aneurysm. MRA NECK FINDINGS: AORTIC ARCH: Examination degraded by motion artifact. Visualized aortic arch of normal caliber with normal 3 vessel morphology. No flow-limiting stenosis about the origin of the great vessels. Visualized subclavian arteries widely patent. RIGHT CAROTID SYSTEM: Right CCA tortuous but widely patent to the bifurcation without stenosis. Atheromatous irregularity about the right carotid bifurcation with associated stenosis of up to approximate 50% by NASCET criteria. Right ICA patent distally to the skull base without stenosis or occlusion. LEFT CAROTID SYSTEM: Left CCA patent from its origin to the bifurcation without stenosis. Atheromatous irregularity about the left carotid bifurcation with associated stenosis of up to approximately 50% by NASCET criteria. Left ICA otherwise patent distally to the skull base without stenosis or occlusion. VERTEBRAL ARTERIES: Both vertebral arteries arise from the subclavian arteries. Right vertebral artery dominant. Vertebral arteries not well assessed proximally due to motion. Visualized portions are mildly tortuous but widely patent without stenosis or occlusion. IMPRESSION: MRI HEAD IMPRESSION: 1. No acute intracranial infarct or other abnormality. 2. Generalized age advanced cerebral atrophy with mild chronic small vessel ischemic disease. 3. 8 mm meningioma overlying the left frontal convexity without associated mass effect. MRA HEAD IMPRESSION: Negative intracranial MRA with no large vessel occlusion. No hemodynamically significant or correctable stenosis. MRA NECK IMPRESSION: 1. Atheromatous change about the carotid bifurcations with estimated stenoses of up  to approximately 50% by NASCET criteria bilaterally. 2. Patent vertebral arteries within the neck. Right vertebral artery dominant. Electronically Signed   By: Jeannine Boga M.D.   On: 05/19/2019 20:56   DG ERCP BILIARY & PANCREATIC DUCTS  Result Date: 05/20/2019 CLINICAL DATA:  Biliary and pancreatic ductal dilatation. EXAM: ERCP TECHNIQUE: Multiple spot images obtained with the fluoroscopic device and submitted for interpretation post-procedure. COMPARISON:  CT of the abdomen and pelvis on 05/18/2019 FINDINGS: Imaging obtained with a C-arm during the endoscopic procedure demonstrates cannulation of the common bile duct with contrast injection demonstrating diffuse and massive dilatation of the common bile duct as well as visualized intrahepatic ducts. A self expanding metallic biliary stent was placed in the distal CBD. IMPRESSION: Biliary obstruction treated with placement of a self expanding metallic stent in the distal CBD. These images were submitted for radiologic interpretation only. Please see the procedural report for the amount of contrast and the fluoroscopy time utilized. Electronically Signed   By: Aletta Edouard M.D.   On: 05/20/2019 16:13   ECHOCARDIOGRAM COMPLETE  Result Date: 05/19/2019    ECHOCARDIOGRAM REPORT   Patient Name:   LAKEYTA VANDENHEUVEL Date of Exam: 05/19/2019 Medical Rec #:  [id number]          Height:       67.0 in Accession #:    [id number]         Weight:       145.0 lb Date of Birth:  1930/09/28          BSA:          1.764 m Patient Age:    5 years           BP:           103/58 mmHg Patient Gender: F                  HR:           92 bpm. Exam Location:  Inpatient Procedure: 2D Echo Indications:    stroke like symptom  History:        Patient has no prior history of Echocardiogram examinations.                 Risk Factors:Diabetes, Dyslipidemia and Hypertension.  Sonographer:    Johny Chess Referring Phys: [id number] TAYE T GONFA IMPRESSIONS  1. Left ventricular  ejection fraction, by estimation, is 60 to 65%. The left ventricle has normal function. The left ventricle has no regional wall motion abnormalities. Left ventricular diastolic parameters were normal.  2. Right ventricular systolic function is normal. The right ventricular size is normal.  3. The mitral valve is normal in structure. Trivial mitral valve regurgitation. No evidence of mitral stenosis.  4. The aortic valve is tricuspid. Aortic valve regurgitation is not visualized. Mild to moderate aortic valve sclerosis/calcification is present, without any evidence of aortic stenosis.  5. The inferior vena cava is normal in size with greater than 50% respiratory variability, suggesting right atrial pressure of 3 mmHg. Comparison(s): No prior Echocardiogram. FINDINGS  Left Ventricle: Left ventricular ejection fraction, by estimation, is 60 to 65%. The left ventricle has normal function. The left ventricle has no regional wall motion abnormalities. The left ventricular internal cavity size was normal in size. There is  no left ventricular hypertrophy. Left ventricular diastolic parameters were normal. Right Ventricle: The right ventricular size is normal. No increase in right ventricular wall thickness. Right ventricular systolic function is normal. Left Atrium: Left atrial size was normal in size. Right Atrium: Right atrial size was normal in size. Pericardium: There is no evidence of pericardial effusion. Mitral Valve: The mitral valve is normal in structure. Trivial mitral valve regurgitation. No evidence of mitral valve stenosis. Tricuspid Valve: The tricuspid valve is normal in structure. Tricuspid valve regurgitation is not demonstrated. No evidence of tricuspid stenosis. Aortic Valve: The aortic valve is tricuspid. . There is moderate thickening and moderate calcification of the aortic valve. Aortic valve regurgitation is not visualized. Mild to moderate aortic valve sclerosis/calcification is present, without  any evidence of aortic stenosis. Moderate aortic valve annular calcification. There is moderate thickening of the aortic valve. There is moderate calcification of the aortic valve. Pulmonic Valve: The pulmonic valve was not well visualized. Pulmonic valve regurgitation is not visualized. No evidence of pulmonic stenosis. Aorta: The aortic root was not well visualized and the ascending aorta was not well visualized. Venous: The inferior vena cava is normal in size with greater than 50% respiratory variability, suggesting right atrial pressure of 3 mmHg. IAS/Shunts: No atrial level shunt detected by color flow Doppler.  LEFT VENTRICLE PLAX 2D LVIDd:         3.00 cm  Diastology LVIDs:         2.05 cm  LV e' lateral: 11.40 cm/s LV PW:         0.80 cm  LV e' medial:  6.96 cm/s LV IVS:  0.90 cm LVOT diam:     1.70 cm LV SV:         42 LV SV Index:   24 LVOT Area:     2.27 cm  RIGHT VENTRICLE RV S prime:     12.80 cm/s TAPSE (M-mode): 1.5 cm LEFT ATRIUM             Index       RIGHT ATRIUM          Index LA diam:        2.70 cm 1.53 cm/m  RA Area:     8.60 cm LA Vol (A2C):   38.2 ml 21.66 ml/m RA Volume:   15.10 ml 8.56 ml/m LA Vol (A4C):   32.7 ml 18.54 ml/m LA Biplane Vol: 35.4 ml 20.07 ml/m  AORTIC VALVE LVOT Vmax:   104.00 cm/s LVOT Vmean:  69.400 cm/s LVOT VTI:    0.183 m  AORTA Ao Root diam: 2.90 cm  SHUNTS Systemic VTI:  0.18 m Systemic Diam: 1.70 cm Buford Dresser MD Electronically signed by Buford Dresser MD Signature Date/Time: 05/19/2019/8:44:09 PM    Final         Scheduled Meds: . aspirin  325 mg Oral Daily  . feeding supplement (ENSURE ENLIVE)  237 mL Oral TID BM  . heparin  5,000 Units Subcutaneous Q8H  . insulin aspart  0-5 Units Subcutaneous QHS  . insulin aspart  0-9 Units Subcutaneous TID WC  . latanoprost  1 drop Both Eyes QHS   Continuous Infusions: . sodium chloride    . piperacillin-tazobactam (ZOSYN)  IV 3.375 g (05/21/19 0646)     LOS: 3 days    Time  spent: 35 minutes    Kanyah Matsushima A Delores Edelstein, MD [facility]   If 7PM-7AM, please contact night-coverage [email or web address]  05/21/2019, 12:01 PM

## 2019-05-21 NOTE — TOC Initial Note (Signed)
Transition of Care [facility]) - Initial/Assessment Note    Patient Details  Name: Debra [name] MRN: [id number] Date of Birth: 1930/01/21  [facility] CM/SW Contact:    Angelita Ingles, RN Phone Number: 05/21/2019, 3:16 PM  Clinical Narrative:   CM at bedside to assess need per daughter request to see CM. Daughter states that her mother received home services for 2 hours per day and is awaiting approval for more hours through the CAP program. Daughter states that she has been working with the CAP manager and would like to know how much longer before approval of more hours. This Probation officer has referred daughter to the CAP manager for the details of that process. Daughter is requesting 3 in 1 and rolling walker. Equipment has been ordered through [employer] and will be delivered to the room. CM will continue to follow for any further needs.               Expected Discharge Plan: [place] Barriers to Discharge: Continued Medical Work up   Patient Goals and CMS Choice        Expected Discharge Plan and Services Expected Discharge Plan: [place] In-house Referral: NA Discharge Planning Services: CM Consult Post Acute Care Choice: Durable Medical Equipment Living arrangements for the past 2 months: Single Family Home                 DME Arranged: 3-N-1, [place]ile DME Agency: Other - Comment([employer]) Date DME Agency Contacted: 05/21/19 Time DME Agency Contacted: [phone] Representative spoke with at DME Agency: Brenton Grills HH Arranged: NA [place] Agency: NA        Prior Living Arrangements/Services Living arrangements for the past 2 months: [place] with:: Adult Children Patient language and need for interpreter reviewed:: Yes Do you feel safe going back to the place where you live?: Yes      Need for Family Participation in Patient Care: Yes (Comment) Care giver support system in place?: Yes (comment) Current home services: Homehealth  aide Criminal Activity/Legal Involvement Pertinent to Current Situation/Hospitalization: No - Comment as needed  Activities of Daily Living   ADL Screening (condition at time of admission) Patient's cognitive ability adequate to safely complete daily activities?: (P) Yes Is the patient deaf or have difficulty hearing?: (P) No  Permission Sought/Granted                  Emotional Assessment Appearance:: Appears stated age Attitude/Demeanor/Rapport: Unable to Assess(confused) Affect (typically observed): Unable to Assess(confused) Orientation: : Oriented to Self Alcohol / Substance Use: Not Applicable Psych Involvement: No (comment)  Admission diagnosis:  TIA (transient ischemic attack) [G45.9] Acute cholangitis [K83.09] Elevated bilirubin [R17] Patient Active Problem List   Diagnosis Date Noted  . Protein-calorie malnutrition, severe 05/20/2019  . Acute cholangitis 05/18/2019  . Unintentional weight loss 05/18/2019  . Slurred speech 05/18/2019  . HLD (hyperlipidemia) 05/18/2019  . HTN (hypertension) 05/18/2019  . DM (diabetes mellitus), type 2 ([place]) 05/18/2019  . Hypokalemia 05/18/2019  . TIA (transient ischemic attack) 05/18/2019  . Elevated bilirubin   . Transaminitis   . Gallstone pancreatitis   . Choledocholithiasis   . Dilated cbd, acquired   . Pancreatic duct dilated   . Lactic acidosis   . Poor appetite 04/13/2019  . Dilation of biliary tract 04/13/2019  . Abnormal CT scan, gallbladder 04/13/2019  . Abnormal weight loss 04/13/2019   PCP:  Asencion Noble, MD Pharmacy:   [facility], [place] [address] [place] [address] Phone: [phone] Fax: [phone]     Social Determinants of Health (SDOH) Interventions    Readmission Risk Interventions No flowsheet data found.

## 2019-05-21 NOTE — Progress Notes (Signed)
Pharmacy Antibiotic Note  Debra [name] is a 86 y.o. female admitted on 05/18/2019 with elevated LFTs, increased LA 4.5, wbc wnl, Cr 1.5, afebrile possible pancreatic mass.  Pharmacy has been consulted for Zosyn  Dosing.  Testing now shows likely cancer. Per GI, can transition to Augmentin at discharge. SCr up to 1.54 however CrCl still >20 ml/min. Continue current dosing for now.   Plan: Zosyn 3.375 gm q8h EI Will continue to follow renal function, culture results, LOT, and antibiotic de-escalation plans   Height: 5\' 7"  (170.2 cm) Weight: 65.8 kg (145 lb) IBW/kg (Calculated) : 61.6  Temp (24hrs), Avg:98.1 F (36.7 C), Min:98 F (36.7 C), Max:98.2 F (36.8 C)  Recent Labs  Lab 05/18/19 0941 05/18/19 0941 05/18/19 1136 05/18/19 1658 05/18/19 2258 05/18/19 2258 05/19/19 0409 05/19/19 0844 05/19/19 1126 05/20/19 0420 05/20/19 1212 05/20/19 1449 05/21/19 0346  WBC 8.2  --   --   --  8.4  --  7.9  --   --  7.1  --   --  5.2  CREATININE 1.49*   < >  --   --  1.34*   < > 1.41*  --   --  1.39* 1.34* 1.20* 1.54*  LATICACIDVEN 4.5*  --  4.2* 3.0*  --   --   --  1.5 1.5  --   --   --   --    < > = values in this interval not displayed.    Estimated Creatinine Clearance: 25 mL/min (A) (by C-G formula based on SCr of 1.54 mg/dL (H)).    Allergies  Allergen Reactions  . Other Nausea And Vomiting    Diet Sodas. Sugar-free jello    Zosyn 5/3 >>  5/3 UCx >>neg 5/3 COVID/Flu negative  Thank you for allowing pharmacy to be a part of this patient's care.  Alycia Rossetti, PharmD, BCPS Clinical Pharmacist Clinical phone for 05/21/2019: [phone] 05/21/2019 1:28 PM   **Pharmacist phone directory can now be found on [email or web address] ([email or web address]).  Listed under [place].

## 2019-05-21 NOTE — Telephone Encounter (Signed)
-----   Message from Milus Banister, MD sent at 05/21/2019 11:00 AM EDT ----- She will probably be d/cd in next few days from [facility]. 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  Thanks

## 2019-05-21 NOTE — Progress Notes (Signed)
Daily Rounding Note  05/21/2019, 10:43 AM  LOS: 3 days   SUBJECTIVE:   Chief complaint: Ampullary mass, suspicious for carcinoma.  Cholangitis.    Denies abdominal pain, says she has never had abdominal pain.  No nausea or vomiting.  Tolerating solid food but appetite still diminished.  Consumed the Ensure bottle quickly this morning. 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  Dr. Ardis Hughs has discussed findings with the patient's son and daughter.  Plan for referral to oncology  OBJECTIVE:         Vital signs in last 24 hours:    Temp:  [97.6 F (36.4 C)-98.2 F (36.8 C)] 98.2 F (36.8 C) (05/06 0805) Pulse Rate:  [73-103] 73 (05/06 0402) Resp:  [16-23] 21 (05/06 0805) BP: (95-110)/(34-56) 105/49 (05/06 0805) SpO2:  [96 %-100 %] 98 % (05/06 0805) Last BM Date: 05/20/19 Filed Weights   05/18/19 0903  Weight: 65.8 kg   General: Patient looks well though has scleral icterus.  Alert, animated Heart: RRR. Chest: No labored breathing or cough Abdomen: Soft, nontender, nondistended.  Active bowel sounds. Extremities: No CCE. Neuro/Psych: Appropriate, follows commands.  No gross deficits.  Intake/Output from previous day: 05/05 0701 - 05/06 0700 In: 739.6 [P.O.:120; I.V.:302.7; IV Piggyback:316.9] Out: -   Intake/Output this shift: Total I/O In: 658 [P.O.:658] Out: -   Lab Results: Recent Labs    05/19/19 0409 05/19/19 0409 05/20/19 0420 05/20/19 1449 05/21/19 0346  WBC 7.9  --  7.1  --  5.2  HGB 9.2*   < > 9.7* 10.5* 8.5*  HCT 26.8*   < > 28.2* 31.0* 25.8*  PLT 538*  --  513*  --  441*   < > = values in this interval not displayed.   BMET Recent Labs    05/20/19 0420 05/20/19 0420 05/20/19 1212 05/20/19 1449 05/21/19 0346  NA 131*   < > 132* 133* 132*  K 2.8*   < > 3.3* 3.6 4.0  CL 98   < > 97* 101 102  CO2 19*  --  21*  --  18*  GLUCOSE 94   < > 92 81 176*    BUN 13   < > 13 13 14   CREATININE 1.39*   < > 1.34* 1.20* 1.54*  CALCIUM 8.2*  --  8.1*  --  8.0*   < > = values in this interval not displayed.   LFT Recent Labs    05/19/19 0409 05/19/19 0409 05/19/19 1515 05/20/19 0420 05/21/19 0346  PROT 6.0*  --   --  5.9* 5.6*  ALBUMIN 1.5*  --   --  1.4* 1.2*  AST 463*  --   --  491* 314*  ALT 179*  --   --  181* 149*  ALKPHOS 607*  --   --  701* 595*  BILITOT 7.4*   < > 8.0* 8.3* 5.7*  BILIDIR  --   --  5.2*  --  3.3*  IBILI  --   --  2.8*  --  2.4*   < > = values in this interval not displayed.   PT/INR Recent Labs    05/19/19 0409  LABPROT 14.4  INR 1.2   Hepatitis Panel No results for input(s): HEPBSAG, HCVAB, HEPAIGM, HEPBIGM in the last 72 hours.  Studies/Results: MR ANGIO HEAD WO CONTRAST  Result Date: 05/19/2019 CLINICAL DATA:  Initial evaluation for acute slurred speech, left-sided facial droop. EXAM:  MR HEAD WITHOUT CONTRAST MR CIRCLE OF WILLIS WITHOUT CONTRAST MRA OF THE NECK WITHOUT AND WITH CONTRAST TECHNIQUE: Multiplanar, multiecho pulse sequences of the brain, circle of Willis and surrounding structures were obtained without intravenous contrast. Angiographic images of the neck were obtained using MRA technique without and with intravenous contrast. CONTRAST:  6.5 cc of Gadavist. COMPARISON:  Prior head CT from 05/18/2019. FINDINGS: MRI HEAD FINDINGS Brain: Diffuse prominence of the CSF containing spaces compatible with generalized age-related cerebral atrophy. Patchy and confluent T2/FLAIR hyperintensity within the periventricular and deep white matter both cerebral hemispheres most consistent with chronic small vessel ischemic disease, mild for age. No abnormal foci of restricted diffusion to suggest acute or subacute ischemia. Gray-white matter differentiation maintained. No encephalomalacia to suggest chronic cortical infarction. No evidence for acute or chronic intracranial hemorrhage. 8 mm meningioma overlies the left  frontal convexity without associated mass effect (series 6, image 15). No other mass lesion, mass effect, or midline shift. Diffuse ventricular prominence related to global parenchymal volume loss without hydrocephalus. No extra-axial fluid collection. Note made of a partially empty sella. Midline structures intact. Vascular: Major intracranial vascular flow voids are maintained. Skull and upper cervical spine: Craniocervical junction within normal limits. Multilevel degenerative spondylolysis noted within the upper cervical spine with resultant moderate spinal stenosis at the level of C3-4. Bone marrow signal intensity within normal limits. No focal marrow replacing lesion. No scalp soft tissue abnormality. Sinuses/Orbits: Patient status post bilateral ocular lens replacement. Small amount of pneumatized secretions noted within the left sphenoid sinus. Paranasal sinuses are otherwise largely clear. No significant mastoid effusion. Inner ear structures grossly normal. Other: None. MRA HEAD FINDINGS ANTERIOR CIRCULATION: Visualized distal cervical segments of the internal carotid arteries are patent with symmetric antegrade flow. Petrous, cavernous, and supraclinoid ICAs patent without flow-limiting stenosis. A1 segments widely patent. Normal anterior communicating artery complex. Anterior cerebral arteries patent to their distal aspects without stenosis. No M1 stenosis or occlusion. Normal MCA bifurcations. Distal MCA branches well perfused and symmetric. POSTERIOR CIRCULATION: Partially visualized vertebral arteries widely patent. Right vertebral artery dominant. Both picas patent. Basilar patent to its distal aspect without stenosis. Superior cerebral arteries patent bilaterally. Right PCA supplied via the basilar. Predominant fetal type origin of the left PCA. Both PCAs well perfused to their distal aspects. No intracranial aneurysm. MRA NECK FINDINGS: AORTIC ARCH: Examination degraded by motion artifact.  Visualized aortic arch of normal caliber with normal 3 vessel morphology. No flow-limiting stenosis about the origin of the great vessels. Visualized subclavian arteries widely patent. RIGHT CAROTID SYSTEM: Right CCA tortuous but widely patent to the bifurcation without stenosis. Atheromatous irregularity about the right carotid bifurcation with associated stenosis of up to approximate 50% by NASCET criteria. Right ICA patent distally to the skull base without stenosis or occlusion. LEFT CAROTID SYSTEM: Left CCA patent from its origin to the bifurcation without stenosis. Atheromatous irregularity about the left carotid bifurcation with associated stenosis of up to approximately 50% by NASCET criteria. Left ICA otherwise patent distally to the skull base without stenosis or occlusion. VERTEBRAL ARTERIES: Both vertebral arteries arise from the subclavian arteries. Right vertebral artery dominant. Vertebral arteries not well assessed proximally due to motion. Visualized portions are mildly tortuous but widely patent without stenosis or occlusion. IMPRESSION: MRI HEAD IMPRESSION: 1. No acute intracranial infarct or other abnormality. 2. Generalized age advanced cerebral atrophy with mild chronic small vessel ischemic disease. 3. 8 mm meningioma overlying the left frontal convexity without associated mass effect. MRA HEAD IMPRESSION: Negative  intracranial MRA with no large vessel occlusion. No hemodynamically significant or correctable stenosis. MRA NECK IMPRESSION: 1. Atheromatous change about the carotid bifurcations with estimated stenoses of up to approximately 50% by NASCET criteria bilaterally. 2. Patent vertebral arteries within the neck. Right vertebral artery dominant. Electronically Signed   By: Jeannine Boga M.D.   On: 05/19/2019 20:56   MR ANGIO NECK W WO CONTRAST  Result Date: 05/19/2019 CLINICAL DATA:  Initial evaluation for acute slurred speech, left-sided facial droop. EXAM: MR HEAD WITHOUT  CONTRAST MR CIRCLE OF WILLIS WITHOUT CONTRAST MRA OF THE NECK WITHOUT AND WITH CONTRAST TECHNIQUE: Multiplanar, multiecho pulse sequences of the brain, circle of Willis and surrounding structures were obtained without intravenous contrast. Angiographic images of the neck were obtained using MRA technique without and with intravenous contrast. CONTRAST:  6.5 cc of Gadavist. COMPARISON:  Prior head CT from 05/18/2019. FINDINGS: MRI HEAD FINDINGS Brain: Diffuse prominence of the CSF containing spaces compatible with generalized age-related cerebral atrophy. Patchy and confluent T2/FLAIR hyperintensity within the periventricular and deep white matter both cerebral hemispheres most consistent with chronic small vessel ischemic disease, mild for age. No abnormal foci of restricted diffusion to suggest acute or subacute ischemia. Gray-white matter differentiation maintained. No encephalomalacia to suggest chronic cortical infarction. No evidence for acute or chronic intracranial hemorrhage. 8 mm meningioma overlies the left frontal convexity without associated mass effect (series 6, image 15). No other mass lesion, mass effect, or midline shift. Diffuse ventricular prominence related to global parenchymal volume loss without hydrocephalus. No extra-axial fluid collection. Note made of a partially empty sella. Midline structures intact. Vascular: Major intracranial vascular flow voids are maintained. Skull and upper cervical spine: Craniocervical junction within normal limits. Multilevel degenerative spondylolysis noted within the upper cervical spine with resultant moderate spinal stenosis at the level of C3-4. Bone marrow signal intensity within normal limits. No focal marrow replacing lesion. No scalp soft tissue abnormality. Sinuses/Orbits: Patient status post bilateral ocular lens replacement. Small amount of pneumatized secretions noted within the left sphenoid sinus. Paranasal sinuses are otherwise largely clear. No  significant mastoid effusion. Inner ear structures grossly normal. Other: None. MRA HEAD FINDINGS ANTERIOR CIRCULATION: Visualized distal cervical segments of the internal carotid arteries are patent with symmetric antegrade flow. Petrous, cavernous, and supraclinoid ICAs patent without flow-limiting stenosis. A1 segments widely patent. Normal anterior communicating artery complex. Anterior cerebral arteries patent to their distal aspects without stenosis. No M1 stenosis or occlusion. Normal MCA bifurcations. Distal MCA branches well perfused and symmetric. POSTERIOR CIRCULATION: Partially visualized vertebral arteries widely patent. Right vertebral artery dominant. Both picas patent. Basilar patent to its distal aspect without stenosis. Superior cerebral arteries patent bilaterally. Right PCA supplied via the basilar. Predominant fetal type origin of the left PCA. Both PCAs well perfused to their distal aspects. No intracranial aneurysm. MRA NECK FINDINGS: AORTIC ARCH: Examination degraded by motion artifact. Visualized aortic arch of normal caliber with normal 3 vessel morphology. No flow-limiting stenosis about the origin of the great vessels. Visualized subclavian arteries widely patent. RIGHT CAROTID SYSTEM: Right CCA tortuous but widely patent to the bifurcation without stenosis. Atheromatous irregularity about the right carotid bifurcation with associated stenosis of up to approximate 50% by NASCET criteria. Right ICA patent distally to the skull base without stenosis or occlusion. LEFT CAROTID SYSTEM: Left CCA patent from its origin to the bifurcation without stenosis. Atheromatous irregularity about the left carotid bifurcation with associated stenosis of up to approximately 50% by NASCET criteria. Left ICA otherwise patent distally  to the skull base without stenosis or occlusion. VERTEBRAL ARTERIES: Both vertebral arteries arise from the subclavian arteries. Right vertebral artery dominant. Vertebral  arteries not well assessed proximally due to motion. Visualized portions are mildly tortuous but widely patent without stenosis or occlusion. IMPRESSION: MRI HEAD IMPRESSION: 1. No acute intracranial infarct or other abnormality. 2. Generalized age advanced cerebral atrophy with mild chronic small vessel ischemic disease. 3. 8 mm meningioma overlying the left frontal convexity without associated mass effect. MRA HEAD IMPRESSION: Negative intracranial MRA with no large vessel occlusion. No hemodynamically significant or correctable stenosis. MRA NECK IMPRESSION: 1. Atheromatous change about the carotid bifurcations with estimated stenoses of up to approximately 50% by NASCET criteria bilaterally. 2. Patent vertebral arteries within the neck. Right vertebral artery dominant. Electronically Signed   By: Jeannine Boga M.D.   On: 05/19/2019 20:56   MR BRAIN WO CONTRAST  Result Date: 05/19/2019 CLINICAL DATA:  Initial evaluation for acute slurred speech, left-sided facial droop. EXAM: MR HEAD WITHOUT CONTRAST MR CIRCLE OF WILLIS WITHOUT CONTRAST MRA OF THE NECK WITHOUT AND WITH CONTRAST TECHNIQUE: Multiplanar, multiecho pulse sequences of the brain, circle of Willis and surrounding structures were obtained without intravenous contrast. Angiographic images of the neck were obtained using MRA technique without and with intravenous contrast. CONTRAST:  6.5 cc of Gadavist. COMPARISON:  Prior head CT from 05/18/2019. FINDINGS: MRI HEAD FINDINGS Brain: Diffuse prominence of the CSF containing spaces compatible with generalized age-related cerebral atrophy. Patchy and confluent T2/FLAIR hyperintensity within the periventricular and deep white matter both cerebral hemispheres most consistent with chronic small vessel ischemic disease, mild for age. No abnormal foci of restricted diffusion to suggest acute or subacute ischemia. Gray-white matter differentiation maintained. No encephalomalacia to suggest chronic cortical  infarction. No evidence for acute or chronic intracranial hemorrhage. 8 mm meningioma overlies the left frontal convexity without associated mass effect (series 6, image 15). No other mass lesion, mass effect, or midline shift. Diffuse ventricular prominence related to global parenchymal volume loss without hydrocephalus. No extra-axial fluid collection. Note made of a partially empty sella. Midline structures intact. Vascular: Major intracranial vascular flow voids are maintained. Skull and upper cervical spine: Craniocervical junction within normal limits. Multilevel degenerative spondylolysis noted within the upper cervical spine with resultant moderate spinal stenosis at the level of C3-4. Bone marrow signal intensity within normal limits. No focal marrow replacing lesion. No scalp soft tissue abnormality. Sinuses/Orbits: Patient status post bilateral ocular lens replacement. Small amount of pneumatized secretions noted within the left sphenoid sinus. Paranasal sinuses are otherwise largely clear. No significant mastoid effusion. Inner ear structures grossly normal. Other: None. MRA HEAD FINDINGS ANTERIOR CIRCULATION: Visualized distal cervical segments of the internal carotid arteries are patent with symmetric antegrade flow. Petrous, cavernous, and supraclinoid ICAs patent without flow-limiting stenosis. A1 segments widely patent. Normal anterior communicating artery complex. Anterior cerebral arteries patent to their distal aspects without stenosis. No M1 stenosis or occlusion. Normal MCA bifurcations. Distal MCA branches well perfused and symmetric. POSTERIOR CIRCULATION: Partially visualized vertebral arteries widely patent. Right vertebral artery dominant. Both picas patent. Basilar patent to its distal aspect without stenosis. Superior cerebral arteries patent bilaterally. Right PCA supplied via the basilar. Predominant fetal type origin of the left PCA. Both PCAs well perfused to their distal aspects. No  intracranial aneurysm. MRA NECK FINDINGS: AORTIC ARCH: Examination degraded by motion artifact. Visualized aortic arch of normal caliber with normal 3 vessel morphology. No flow-limiting stenosis about the origin of the great vessels. Visualized subclavian  arteries widely patent. RIGHT CAROTID SYSTEM: Right CCA tortuous but widely patent to the bifurcation without stenosis. Atheromatous irregularity about the right carotid bifurcation with associated stenosis of up to approximate 50% by NASCET criteria. Right ICA patent distally to the skull base without stenosis or occlusion. LEFT CAROTID SYSTEM: Left CCA patent from its origin to the bifurcation without stenosis. Atheromatous irregularity about the left carotid bifurcation with associated stenosis of up to approximately 50% by NASCET criteria. Left ICA otherwise patent distally to the skull base without stenosis or occlusion. VERTEBRAL ARTERIES: Both vertebral arteries arise from the subclavian arteries. Right vertebral artery dominant. Vertebral arteries not well assessed proximally due to motion. Visualized portions are mildly tortuous but widely patent without stenosis or occlusion. IMPRESSION: MRI HEAD IMPRESSION: 1. No acute intracranial infarct or other abnormality. 2. Generalized age advanced cerebral atrophy with mild chronic small vessel ischemic disease. 3. 8 mm meningioma overlying the left frontal convexity without associated mass effect. MRA HEAD IMPRESSION: Negative intracranial MRA with no large vessel occlusion. No hemodynamically significant or correctable stenosis. MRA NECK IMPRESSION: 1. Atheromatous change about the carotid bifurcations with estimated stenoses of up to approximately 50% by NASCET criteria bilaterally. 2. Patent vertebral arteries within the neck. Right vertebral artery dominant. Electronically Signed   By: Jeannine Boga M.D.   On: 05/19/2019 20:56   DG ERCP BILIARY & PANCREATIC DUCTS  Result Date: 05/20/2019 CLINICAL  DATA:  Biliary and pancreatic ductal dilatation. EXAM: ERCP TECHNIQUE: Multiple spot images obtained with the fluoroscopic device and submitted for interpretation post-procedure. COMPARISON:  CT of the abdomen and pelvis on 05/18/2019 FINDINGS: Imaging obtained with a C-arm during the endoscopic procedure demonstrates cannulation of the common bile duct with contrast injection demonstrating diffuse and massive dilatation of the common bile duct as well as visualized intrahepatic ducts. A self expanding metallic biliary stent was placed in the distal CBD. IMPRESSION: Biliary obstruction treated with placement of a self expanding metallic stent in the distal CBD. These images were submitted for radiologic interpretation only. Please see the procedural report for the amount of contrast and the fluoroscopy time utilized. Electronically Signed   By: Aletta Edouard M.D.   On: 05/20/2019 16:13   ECHOCARDIOGRAM COMPLETE  Result Date: 05/19/2019    ECHOCARDIOGRAM REPORT   Patient Name:   NONI STONESIFER Date of Exam: 05/19/2019 Medical Rec #:  [id number]          Height:       67.0 in Accession #:    [id number]         Weight:       145.0 lb Date of Birth:  Nov 14, 1930          BSA:          1.764 m Patient Age:    86 years           BP:           103/58 mmHg Patient Gender: F                  HR:           92 bpm. Exam Location:  Inpatient Procedure: 2D Echo Indications:    stroke like symptom  History:        Patient has no prior history of Echocardiogram examinations.                 Risk Factors:Diabetes, Dyslipidemia and Hypertension.  Sonographer:    Johny Chess Referring Phys: [phone]  TAYE T GONFA IMPRESSIONS  1. Left ventricular ejection fraction, by estimation, is 60 to 65%. The left ventricle has normal function. The left ventricle has no regional wall motion abnormalities. Left ventricular diastolic parameters were normal.  2. Right ventricular systolic function is normal. The right ventricular size is  normal.  3. The mitral valve is normal in structure. Trivial mitral valve regurgitation. No evidence of mitral stenosis.  4. The aortic valve is tricuspid. Aortic valve regurgitation is not visualized. Mild to moderate aortic valve sclerosis/calcification is present, without any evidence of aortic stenosis.  5. The inferior vena cava is normal in size with greater than 50% respiratory variability, suggesting right atrial pressure of 3 mmHg. Comparison(s): No prior Echocardiogram. FINDINGS  Left Ventricle: Left ventricular ejection fraction, by estimation, is 60 to 65%. The left ventricle has normal function. The left ventricle has no regional wall motion abnormalities. The left ventricular internal cavity size was normal in size. There is  no left ventricular hypertrophy. Left ventricular diastolic parameters were normal. Right Ventricle: The right ventricular size is normal. No increase in right ventricular wall thickness. Right ventricular systolic function is normal. Left Atrium: Left atrial size was normal in size. Right Atrium: Right atrial size was normal in size. Pericardium: There is no evidence of pericardial effusion. Mitral Valve: The mitral valve is normal in structure. Trivial mitral valve regurgitation. No evidence of mitral valve stenosis. Tricuspid Valve: The tricuspid valve is normal in structure. Tricuspid valve regurgitation is not demonstrated. No evidence of tricuspid stenosis. Aortic Valve: The aortic valve is tricuspid. . There is moderate thickening and moderate calcification of the aortic valve. Aortic valve regurgitation is not visualized. Mild to moderate aortic valve sclerosis/calcification is present, without any evidence of aortic stenosis. Moderate aortic valve annular calcification. There is moderate thickening of the aortic valve. There is moderate calcification of the aortic valve. Pulmonic Valve: The pulmonic valve was not well visualized. Pulmonic valve regurgitation is not  visualized. No evidence of pulmonic stenosis. Aorta: The aortic root was not well visualized and the ascending aorta was not well visualized. Venous: The inferior vena cava is normal in size with greater than 50% respiratory variability, suggesting right atrial pressure of 3 mmHg. IAS/Shunts: No atrial level shunt detected by color flow Doppler.  LEFT VENTRICLE PLAX 2D LVIDd:         3.00 cm  Diastology LVIDs:         2.05 cm  LV e' lateral: 11.40 cm/s LV PW:         0.80 cm  LV e' medial:  6.96 cm/s LV IVS:        0.90 cm LVOT diam:     1.70 cm LV SV:         42 LV SV Index:   24 LVOT Area:     2.27 cm  RIGHT VENTRICLE RV S prime:     12.80 cm/s TAPSE (M-mode): 1.5 cm LEFT ATRIUM             Index       RIGHT ATRIUM          Index LA diam:        2.70 cm 1.53 cm/m  RA Area:     8.60 cm LA Vol (A2C):   38.2 ml 21.66 ml/m RA Volume:   15.10 ml 8.56 ml/m LA Vol (A4C):   32.7 ml 18.54 ml/m LA Biplane Vol: 35.4 ml 20.07 ml/m  AORTIC VALVE LVOT Vmax:  104.00 cm/s LVOT Vmean:  69.400 cm/s LVOT VTI:    0.183 m  AORTA Ao Root diam: 2.90 cm  SHUNTS Systemic VTI:  0.18 m Systemic Diam: 1.70 cm Buford Dresser MD Electronically signed by Buford Dresser MD Signature Date/Time: 05/19/2019/8:44:09 PM    Final    Scheduled Meds: . aspirin  325 mg Oral Daily  . feeding supplement (ENSURE ENLIVE)  237 mL Oral TID BM  . heparin  5,000 Units Subcutaneous Q8H  . insulin aspart  0-5 Units Subcutaneous QHS  . insulin aspart  0-9 Units Subcutaneous TID WC  . latanoprost  1 drop Both Eyes QHS   Continuous Infusions: . sodium chloride    . magnesium sulfate bolus IVPB 2 g (05/21/19 1100)  . piperacillin-tazobactam (ZOSYN)  IV 3.375 g (05/21/19 0646)   PRN Meds:.ondansetron **OR** ondansetron (ZOFRAN) IV, senna  ASSESMENT:   *   Ampullary cancer, cholangitis, elevated LFTs and Lipase.  . 05/20/2019 ERCP. 2 to 3 cm ulcerated ampullary mass suspicious for carcinoma, extensively biopsied.  Mass is causing a  distal CBD stricture.  4 cm long, 10 mm diameter uncovered metal mesh stent.  Abundant purulent material flowed out of the duct. Day 4 Zosyn.   Elevated LFTs improved.  Interestingly the CA 19-9 is less than 2, well within normal range.  CEA elevated at 7.5. Pathology confirming cancer but no official printable report in chart yet.     *    AKI versus CKD.  *    Hyponatremia.  *    Normocytic anemia.  Hb 8.5.  Stool FOBT negative.  *    Transient dysarthria and left facial droop.  MRI head shows advanced age-related cerebral atrophy and mild small vessel ischemic disease.  8 mm left frontal meningioma.   CT head shows atrophy and chronic small vessel ischemia.  Pt alert and verbal this AM.     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  Dr. Ardis Hughs has discussed findings with the patient's son and daughter.  Plan for referral to oncology He is available for GI office visit or repeat ERCP if needed in future. Continue another 5 or 6 days of antibiotics, could switch to oral Augmentin at discharge to complete course. GI will sign off.      Azucena Freed  05/21/2019, 10:43 AM Phone [phone]

## 2019-05-22 LAB — GLUCOSE, CAPILLARY
Glucose-Capillary: 113 mg/dL — ABNORMAL HIGH (ref 70–99)
Glucose-Capillary: 163 mg/dL — ABNORMAL HIGH (ref 70–99)
Glucose-Capillary: 141 mg/dL — ABNORMAL HIGH (ref 70–99)
Glucose-Capillary: 207 mg/dL — ABNORMAL HIGH (ref 70–99)

## 2019-05-22 LAB — HEPATIC FUNCTION PANEL
ALT: 112 U/L — ABNORMAL HIGH (ref 0–44)
AST: 172 U/L — ABNORMAL HIGH (ref 15–41)
Albumin: 1.3 g/dL — ABNORMAL LOW (ref 3.5–5.0)
Alkaline Phosphatase: 448 U/L — ABNORMAL HIGH (ref 38–126)
Bilirubin, Direct: 1.4 mg/dL — ABNORMAL HIGH (ref 0.0–0.2)
Indirect Bilirubin: 1.5 mg/dL — ABNORMAL HIGH (ref 0.3–0.9)
Total Bilirubin: 2.9 mg/dL — ABNORMAL HIGH (ref 0.3–1.2)
Total Protein: 5.5 g/dL — ABNORMAL LOW (ref 6.5–8.1)

## 2019-05-22 LAB — CBC
HCT: 24.5 % — ABNORMAL LOW (ref 36.0–46.0)
Hemoglobin: 8.2 g/dL — ABNORMAL LOW (ref 12.0–15.0)
MCH: 30.9 pg (ref 26.0–34.0)
MCHC: 33.5 g/dL (ref 30.0–36.0)
MCV: 92.5 fL (ref 80.0–100.0)
Platelets: 456 10*3/uL — ABNORMAL HIGH (ref 150–400)
RBC: 2.65 MIL/uL — ABNORMAL LOW (ref 3.87–5.11)
RDW: 13.8 % (ref 11.5–15.5)
WBC: 9.4 10*3/uL (ref 4.0–10.5)
nRBC: 0 % (ref 0.0–0.2)

## 2019-05-22 LAB — BASIC METABOLIC PANEL
Anion gap: 8 (ref 5–15)
BUN: 15 mg/dL (ref 8–23)
CO2: 21 mmol/L — ABNORMAL LOW (ref 22–32)
Calcium: 7.6 mg/dL — ABNORMAL LOW (ref 8.9–10.3)
Chloride: 104 mmol/L (ref 98–111)
Creatinine, Ser: 1.52 mg/dL — ABNORMAL HIGH (ref 0.44–1.00)
GFR calc Af Amer: 35 mL/min — ABNORMAL LOW (ref >60)
GFR calc non Af Amer: 31 mL/min — ABNORMAL LOW (ref >60)
Glucose, Bld: 144 mg/dL — ABNORMAL HIGH (ref 70–99)
Potassium: 3.9 mmol/L (ref 3.5–5.1)
Sodium: 133 mmol/L — ABNORMAL LOW (ref 135–145)

## 2019-05-22 LAB — LACTIC ACID, PLASMA
Lactic Acid, Venous: 3.1 mmol/L (ref 0.5–1.9)
Lactic Acid, Venous: 2.1 mmol/L (ref 0.5–1.9)

## 2019-05-22 MED ORDER — GLUCERNA SHAKE PO LIQD
237.0000 mL | Freq: Three times a day (TID) | ORAL | Status: DC
  Administered 2019-05-22 – 2019-05-23 (×4): 237 mL via ORAL

## 2019-05-22 NOTE — Progress Notes (Addendum)
PROGRESS NOTE    Debra [name]  ZRA:[id number] DOB: [birth year] DOA: 05/18/2019 PCP: Asencion Noble, MD   Brief Narrative: 86 year old with past medical history significant for diabetes, hypertension, hyperlipidemia presented to [facility], ED with a slurry speech, left facial droop, Johnson's and unintentional weight loss.  Reportedly had a slurred speech and left facial droop the morning of presentation that lasted about 30 minutes.  Family also noticed jaundice.  Reportedly had about 30 pounds weight loss over the last year.  Patient was found to have transaminases, elevated lipase 1132, CT head without acute finding.  CT abdomen and pelvis show pancreatic duct and intra and extrahepatic biliary duct dilation and cholelithiasis.  Sequently patient complaining of right upper arm weakness, reported to be new per family.  Neurology was consulted CVA work-up initiated.  Assessment & Plan:   Active Problems:   Dilation of biliary tract   Gallstone pancreatitis   Pancreatic duct dilated   Lactic acidosis   Acute cholangitis   Unintentional weight loss   Slurred speech   HLD (hyperlipidemia)   HTN (hypertension)   DM (diabetes mellitus), type 2 (HCC)   Hypokalemia   TIA (transient ischemic attack)   Protein-calorie malnutrition, severe  1-Obstructive jaundice, acute pancreatitis; ampullary mass, cholangitis, hyperbilirubinemia,  Preliminary path report poorly differentiated adenocarcinoma  patient presented with lipase in the 1000 range. She presented also with obstructive jaundice.  CT with profound intra and extrahepatic biliary duct dilation and slightly increased pancreatic duct dilation.  Concern about a small ampullary or pancreatic malignancy based on imaging. 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  Biopsy obtained.  The mass calluses ensured a broad distal CBD stricture which stent was  placed cholangitis noted. Continue with Zosyn GI is recommending 5 to 7 days of antibiotics given purulent cholangitis. Transition to Augmentin at discharge.  Dr. Edison Nasuti place referral to oncology , out patient.  LFT trending down.   Hypotension; was hypotensive last night again. Continue with fluids. Support care.  Repeated hb stable.   2-right upper extremity weakness/transient left facial droop and slurred speech CVA work-up ; MRI head: No acute intracranial abnormality, mild chronic small vessel disease, 8 mm meningioma.  MR a head; atheromatous change about the carotid bifurcation stenosis of approximately 50% by NASCET criteria bilaterally Aspirin daily Need to start statin when liver function test improved Neurology following.  Diabetes: Continue with a sliding scale insulin Hypertension: Permissive hypertension continue with fluid Lactic acidosis: Related to cholangitis.  Continue with IV fluids and IV antibiotics. AKI;on CKD possible stage IV;  increase cr to 1.5. increase IV fluids.  HypoKalemia: Resolved.  Metabolic acidosis; continue with IV fluid. Hypomagnesemia; continue with IV magnesium supplement.  Normocytic anemia: Follow Mild Hyponatremia; continue with IV fluids.   Nutrition Problem: Severe Malnutrition Etiology: chronic illness    Signs/Symptoms: severe fat depletion, severe muscle depletion, moderate fat depletion, moderate muscle depletion    Interventions: Refer to RD note for recommendations, Boost Breeze  Estimated body mass index is 22.71 kg/m as calculated from the following:   Height as of this encounter: 5\' 7"  (1.702 m).   Weight as of this encounter: 65.8 kg.   DVT prophylaxis: SCDs Code Status: Full code Family Communication: Care discussed with patient Disposition Plan:  Patient is from: Home Anticipated d/c date: 5/08 Anticipate to discharge home if no further hypotension today  Barriers to d/c or necessity for inpatient status:  Develops Hypotension 5/06 and last night. Continue with IV fluids.   Consultants:   GI  Neurology  Procedures:   ERCP  Antimicrobials:  Zosyn  Subjective: Alert, denies pain.  Objective: Vitals:   05/22/19 0400 05/22/19 0741 05/22/19 0800 05/22/19 1224  BP: (!) 105/51 (!) 103/50 (!) 108/50 (!) 110/91  Pulse:      Resp: 17 19 15  (!) 21  Temp: 98.1 F (36.7 C) 98 F (36.7 C)  97.8 F (36.6 C)  TempSrc: Oral Oral  Oral  SpO2:  98%  100%  Weight:      Height:        Intake/Output Summary (Last 24 hours) at 05/22/2019 1435 Last data filed at 05/22/2019 1100 Gross per 24 hour  Intake 714.34 ml  Output 550 ml  Net 164.34 ml   Filed Weights   05/18/19 0903  Weight: 65.8 kg    Examination:  General exam; NAD, Icteric  Respiratory system: CTA Cardiovascular system: S 1, S 2 RRR Gastrointestinal system: BS present, soft, nt Central nervous system: Alert, conversant Extremities: Symmetric power.     Data Reviewed: I have personally reviewed following labs and imaging studies  CBC: Recent Labs  Lab 05/18/19 0941 05/18/19 0941 05/18/19 2258 05/18/19 2258 05/19/19 0409 05/19/19 0409 05/20/19 0420 05/20/19 1449 05/21/19 0346 05/21/19 1612 05/22/19 0818  WBC 8.2   < > 8.4  --  7.9  --  7.1  --  5.2  --  9.4  NEUTROABS 6.1  --   --   --   --   --  4.8  --   --   --   --   HGB 8.6*   < > 9.5*   < > 9.2*   < > 9.7* 10.5* 8.5* 8.0* 8.2*  HCT 26.1*   < > 28.0*   < > 26.8*   < > 28.2* 31.0* 25.8* 23.7* 24.5*  MCV 92.6   < > 90.3  --  89.6  --  89.5  --  91.8  --  92.5  PLT 499*   < > 601*  --  538*  --  513*  --  441*  --  456*   < > = values in this interval not displayed.   Basic Metabolic Panel: Recent Labs  Lab 05/18/19 0941 05/18/19 2258 05/19/19 0409 05/19/19 0409 05/20/19 0420 05/20/19 1212 05/20/19 1449 05/21/19 0346 05/22/19 0347  NA 131*  --  131*   < > 131* 132* 133* 132* 133*  K 3.1*  --  3.2*   < > 2.8* 3.3* 3.6 4.0 3.9  CL 95*  --  100    < > 98 97* 101 102 104  CO2 23  --  19*  --  19* 21*  --  18* 21*  GLUCOSE 209*  --  119*   < > 94 92 81 176* 144*  BUN 25*  --  15   < > 13 13 13 14 15   CREATININE 1.49*   < > 1.41*   < > 1.39* 1.34* 1.20* 1.54* 1.52*  CALCIUM 8.0*  --  8.0*  --  8.2* 8.1*  --  8.0* 7.6*  MG 1.7  --   --   --  1.4* 1.6*  --  1.6*  --   PHOS  --   --   --   --  3.4  --   --   --   --    < > = values  in this interval not displayed.   GFR: Estimated Creatinine Clearance: 25.4 mL/min (A) (by C-G formula based on SCr of 1.52 mg/dL (H)). Liver Function Tests: Recent Labs  Lab 05/18/19 0941 05/18/19 0941 05/19/19 0409 05/19/19 1515 05/20/19 0420 05/21/19 0346 05/22/19 0818  AST 478*  --  463*  --  491* 314* 172*  ALT 175*  --  179*  --  181* 149* 112*  ALKPHOS 652*  --  607*  --  701* 595* 448*  BILITOT 6.9*   < > 7.4* 8.0* 8.3* 5.7* 2.9*  PROT 5.7*  --  6.0*  --  5.9* 5.6* 5.5*  ALBUMIN 1.7*  --  1.5*  --  1.4* 1.2* 1.3*   < > = values in this interval not displayed.   Recent Labs  Lab 05/18/19 0941 05/19/19 0409 05/20/19 0420  LIPASE 1,132* 996* 602*   No results for input(s): AMMONIA in the last 168 hours. Coagulation Profile: Recent Labs  Lab 05/18/19 0941 05/19/19 0409  INR 1.3* 1.2   Cardiac Enzymes: Recent Labs  Lab 05/19/19 1515  CKTOTAL 550*   BNP (last 3 results) No results for input(s): PROBNP in the last 8760 hours. HbA1C: Recent Labs    05/20/19 0420  HGBA1C 6.1*   CBG: Recent Labs  Lab 05/21/19 1125 05/21/19 1703 05/21/19 2047 05/22/19 0739 05/22/19 1221  GLUCAP 242* 322* 261* 113* 163*   Lipid Profile: Recent Labs    05/20/19 0420  CHOL 269*  HDL <10*  LDLCALC NOT CALCULATED  TRIG 189*  CHOLHDL NOT CALCULATED   Thyroid Function Tests: Recent Labs    05/19/19 1519  TSH 4.886*   Anemia Panel: Recent Labs    05/19/19 1515 05/19/19 1519  VITAMINB12 4,543*  --   FOLATE  --  14.9  FERRITIN 771*  --   TIBC 137*  --   IRON 46  --     RETICCTPCT  --  2.0   Sepsis Labs: Recent Labs  Lab 05/19/19 0844 05/19/19 1126 05/21/19 1612 05/21/19 1800  LATICACIDVEN 1.5 1.5 2.4* 3.0*    Recent Results (from the past 240 hour(s))  Respiratory Panel by RT PCR (Flu A&B, Covid) - Urine, Catheterized     Status: None   Collection Time: 05/18/19 10:47 AM   Specimen: Urine, Catheterized  Result Value Ref Range Status   SARS Coronavirus 2 by RT PCR NEGATIVE NEGATIVE Final    Comment: (NOTE) SARS-CoV-2 target nucleic acids are NOT DETECTED. The SARS-CoV-2 RNA is generally detectable in upper respiratoy specimens during the acute phase of infection. The lowest concentration of SARS-CoV-2 viral copies this assay can detect is 131 copies/mL. A negative result does not preclude SARS-Cov-2 infection and should not be used as the sole basis for treatment or other patient management decisions. A negative result may occur with  improper specimen collection/handling, submission of specimen other than nasopharyngeal swab, presence of viral mutation(s) within the areas targeted by this assay, and inadequate number of viral copies (<131 copies/mL). A negative result must be combined with clinical observations, patient history, and epidemiological information. The expected result is Negative. Fact Sheet for Patients:  PinkCheek.be Fact Sheet for Healthcare Providers:  GravelBags.it This test is not yet ap proved or cleared by the [place] FDA and  has been authorized for detection and/or diagnosis of SARS-CoV-2 by FDA under an Emergency Use Authorization (EUA). This EUA will remain  in effect (meaning this test can be used) for the duration of the COVID-19 declaration  under Section 564(b)(1) of the Act, 21 U.S.C. section 360bbb-3(b)(1), unless the authorization is terminated or revoked sooner.    Influenza A by PCR NEGATIVE NEGATIVE Final   Influenza B by PCR NEGATIVE  NEGATIVE Final    Comment: (NOTE) The Xpert Xpress SARS-CoV-2/FLU/RSV assay is intended as an aid in  the diagnosis of influenza from Nasopharyngeal swab specimens and  should not be used as a sole basis for treatment. Nasal washings and  aspirates are unacceptable for Xpert Xpress SARS-CoV-2/FLU/RSV  testing. Fact Sheet for Patients: PinkCheek.be Fact Sheet for Healthcare Providers: GravelBags.it This test is not yet approved or cleared by the [place] FDA and  has been authorized for detection and/or diagnosis of SARS-CoV-2 by  FDA under an Emergency Use Authorization (EUA). This EUA will remain  in effect (meaning this test can be used) for the duration of the  Covid-19 declaration under Section 564(b)(1) of the Act, 21  U.S.C. section 360bbb-3(b)(1), unless the authorization is  terminated or revoked. Performed at [facility], [address], [place], [place] [address]   Urine Culture     Status: Abnormal   Collection Time: 05/18/19 11:07 AM   Specimen: Urine, Clean Catch  Result Value Ref Range Status   Specimen Description   Final    URINE, CLEAN CATCH Performed at [facility], [address], [place], [place] [address]    Special Requests   Final    NONE Performed at [facility], [address] [place], [place] [address]    Culture (A)  Final    <10,000 COLONIES/mL INSIGNIFICANT GROWTH Performed at [facility] [address], [place], [place] [address]    Report Status 05/19/2019 FINAL  Final         Radiology Studies: DG ERCP BILIARY & PANCREATIC DUCTS  Result Date: 05/20/2019 CLINICAL DATA:  Biliary and pancreatic ductal dilatation. EXAM: ERCP TECHNIQUE: Multiple spot images obtained with the fluoroscopic device and submitted for interpretation post-procedure. COMPARISON:  CT of the abdomen and pelvis on 05/18/2019 FINDINGS: Imaging obtained with a C-arm during the endoscopic procedure  demonstrates cannulation of the common bile duct with contrast injection demonstrating diffuse and massive dilatation of the common bile duct as well as visualized intrahepatic ducts. A self expanding metallic biliary stent was placed in the distal CBD. IMPRESSION: Biliary obstruction treated with placement of a self expanding metallic stent in the distal CBD. These images were submitted for radiologic interpretation only. Please see the procedural report for the amount of contrast and the fluoroscopy time utilized. Electronically Signed   By: Aletta Edouard M.D.   On: 05/20/2019 16:13   VAS [place] LOWER EXTREMITY VENOUS (DVT)  Result Date: 05/21/2019  Lower Venous DVTStudy Other Indications: TIA/ new diagnosis cancer. Comparison Study: none Performing Technologist: June Leap RDMS, RVT  Examination Guidelines: A complete evaluation includes B-mode imaging, spectral Doppler, color Doppler, and power Doppler as needed of all accessible portions of each vessel. Bilateral testing is considered an integral part of a complete examination. Limited examinations for reoccurring indications may be performed as noted. The reflux portion of the exam is performed with the patient in reverse Trendelenburg.  +---------+---------------+---------+-----------+----------+--------------+ RIGHT    CompressibilityPhasicitySpontaneityPropertiesThrombus Aging +---------+---------------+---------+-----------+----------+--------------+ CFV      Full           Yes      Yes                                 +---------+---------------+---------+-----------+----------+--------------+  SFJ      Full                                                        +---------+---------------+---------+-----------+----------+--------------+ FV Prox  Full                                                        +---------+---------------+---------+-----------+----------+--------------+ FV Mid   Full                                                         +---------+---------------+---------+-----------+----------+--------------+ FV DistalFull                                                        +---------+---------------+---------+-----------+----------+--------------+ PFV      Full                                                        +---------+---------------+---------+-----------+----------+--------------+ POP      Full           Yes      Yes                                 +---------+---------------+---------+-----------+----------+--------------+ PTV      Full                                                        +---------+---------------+---------+-----------+----------+--------------+ PERO     Full                                                        +---------+---------------+---------+-----------+----------+--------------+   +---------+---------------+---------+-----------+----------+--------------+ LEFT     CompressibilityPhasicitySpontaneityPropertiesThrombus Aging +---------+---------------+---------+-----------+----------+--------------+ CFV      Full           Yes      Yes                                 +---------+---------------+---------+-----------+----------+--------------+ SFJ      Full                                                        +---------+---------------+---------+-----------+----------+--------------+  FV Prox  Full                                                        +---------+---------------+---------+-----------+----------+--------------+ FV Mid   Full                                                        +---------+---------------+---------+-----------+----------+--------------+ FV DistalFull                                                        +---------+---------------+---------+-----------+----------+--------------+ PFV      Full                                                         +---------+---------------+---------+-----------+----------+--------------+ POP      Full           Yes      Yes                                 +---------+---------------+---------+-----------+----------+--------------+ PTV      Full                                                        +---------+---------------+---------+-----------+----------+--------------+ PERO     Full                                                        +---------+---------------+---------+-----------+----------+--------------+     Summary: RIGHT: - There is no evidence of deep vein thrombosis in the lower extremity.  - No cystic structure found in the popliteal fossa.  LEFT: - There is no evidence of deep vein thrombosis in the lower extremity.  - No cystic structure found in the popliteal fossa.  *See table(s) above for measurements and observations. Electronically signed by Servando Snare MD on 05/21/2019 at 5:26:54 PM.    Final         Scheduled Meds: . aspirin  325 mg Oral Daily  . feeding supplement (ENSURE ENLIVE)  237 mL Oral TID BM  . feeding supplement (GLUCERNA SHAKE)  237 mL Oral TID BM  . heparin  5,000 Units Subcutaneous Q8H  . insulin aspart  0-5 Units Subcutaneous QHS  . insulin aspart  0-9 Units Subcutaneous TID WC  . latanoprost  1 drop Both Eyes QHS   Continuous Infusions: . sodium chloride 100 mL/hr at 05/21/19 2236  . piperacillin-tazobactam (ZOSYN)  IV 3.375 g (05/22/19  1337)     LOS: 4 days    Time spent: 35 minutes    Quaniya Damas A Ezana [place], MD [facility]   If 7PM-7AM, please contact night-coverage [email or web address]  05/22/2019, 2:35 PM

## 2019-05-22 NOTE — Progress Notes (Signed)
Occupational Therapy Treatment Patient Details Name: Debra [name] MRN: [id number] DOB: 07-14-1930 Today's Date: 05/22/2019    History of present illness 86 y.o. female with past medical history of hypertension, hypercholesterolemia, diabetes, CKD,  and memory loss admitted to [facility] for 5/3 for slurred speech, facial droop (resolved on admission), 30 lb weight loss in a year. Abdominal imaging reveals extrahepatic biliary ductal dilatation and dilation of pancreatic duct, concern for possible malignancy, cholelithiasis. CVA workup negative, but MRI reveals 8 mm meningioma overlying the left frontal convexity without associated mass effect as well as cerebral atrophy.   OT comments  Returned to reassess R UE weakness. Continues to have equally weak UEs. Pt has not been self feeding despite her demonstrating ability. Reports she gets "nervous" at times and needs assistance. Educated pt and daughter in importance of pt using her UEs in order to gain strength and functional use. Completed UE exercises with pt. Will follow to establish HEP and continue to encourage participation in ADL.   Follow Up Recommendations  Home health OT    Equipment Recommendations  None recommended by OT    Recommendations for Other Services      Precautions / Restrictions Precautions Precautions: Fall       Mobility Bed Mobility Overal bed mobility: Needs Assistance Bed Mobility: Supine to Sit;Sit to Supine     Supine to sit: Min assist;HOB elevated Sit to supine: Min assist;HOB elevated      Transfers                      Balance Overall balance assessment: Needs assistance;History of Falls   Sitting balance-Leahy Scale: Fair                                     ADL either performed or assessed with clinical judgement   ADL Overall ADL's : Needs assistance/impaired Eating/Feeding: Set up;Sitting Eating/Feeding Details (indicate cue type and reason): pt's daughter has  been feeding her, but pt is able, asked that daughter set pt up and encourage self feeding                                         Vision       Perception     Praxis      Cognition Arousal/Alertness: Awake/alert Behavior During Therapy: WFL for tasks assessed/performed Overall Cognitive Status: History of cognitive impairments - at baseline                                          Exercises Exercises: General Upper Extremity General Exercises - Upper Extremity Shoulder Flexion: AAROM;10 reps;Both;Supine Shoulder Horizontal ABduction: AAROM;Both;10 reps;Supine Shoulder Horizontal ADduction: AAROM;Both;10 reps;Supine Elbow Flexion: Strengthening;Both;10 reps;Seated Elbow Extension: Strengthening;Both;10 reps;Seated   Shoulder Instructions       General Comments      Pertinent Vitals/ Pain       Pain Assessment: No/denies pain  Home Living                                          Prior Functioning/Environment  Frequency  Min 2X/week        Progress Toward Goals  OT Goals(current goals can now be found in the care plan section)  Progress towards OT goals: Progressing toward goals  Acute Rehab OT Goals Patient Stated Goal: go home, walk more  Plan Discharge plan remains appropriate    Co-evaluation                 AM-PAC OT "6 Clicks" Daily Activity     Outcome Measure   Help from another person eating meals?: A Little Help from another person taking care of personal grooming?: A Little Help from another person toileting, which includes using toliet, bedpan, or urinal?: A Lot Help from another person bathing (including washing, rinsing, drying)?: A Lot Help from another person to put on and taking off regular upper body clothing?: A Little Help from another person to put on and taking off regular lower body clothing?: A Lot 6 Click Score: 15    End of Session    OT Visit  Diagnosis: Unsteadiness on feet (R26.81);Other abnormalities of gait and mobility (R26.89);Muscle weakness (generalized) (M62.81);Cognitive communication deficit (R41.841)   Activity Tolerance Patient tolerated treatment well   Patient Left in bed;with call bell/phone within reach;with family/visitor present;with bed alarm set   Nurse Communication          Time: 9628-3662 OT Time Calculation (min): 15 min  Charges: OT General Charges $OT Visit: 1 Visit OT Treatments $Therapeutic Exercise: 8-22 mins  Nestor Lewandowsky, OTR/L Acute Rehabilitation Services Pager: [phone] Office: [phone]   Malka So 05/22/2019, 10:42 AM

## 2019-05-22 NOTE — Progress Notes (Signed)
Inpatient Diabetes Program Recommendations  AACE/ADA: New Consensus Statement on Inpatient Glycemic Control (2015)  Target Ranges:  Prepandial:   less than 140 mg/dL      Peak postprandial:   less than 180 mg/dL (1-2 hours)      Critically ill patients:  140 - 180 mg/dL   Lab Results  Component Value Date   GLUCAP 113 (H) 05/22/2019   HGBA1C 6.1 (H) 05/20/2019    Review of Glycemic Control  Results for Debra, [name] (MRN [id number]) as of 05/22/2019 10:18  Ref. Range 05/21/2019 08:07 05/21/2019 11:25 05/21/2019 17:03 05/21/2019 20:47 05/22/2019 07:39  Glucose-Capillary Latest Ref Range: 70 - 99 mg/dL 162 (H) 242 (H) 322 (H) 261 (H) 113 (H)    Diabetes history:  DM2  Outpatient Diabetes medications:  Metformin 500 mg bid  Current orders for Inpatient glycemic control: Novolog 0-9 tid + 0-5 qhs  Inpatient Diabetes Program Recommendations:    Novolog 3 units tid with meals or supplements if eats or drinks > 50% and CGG >80 mg/dl  Thank you, Reche Dixon, RN, BSN Diabetes Coordinator Inpatient Diabetes Program [phone] (team pager from 8a-5p)

## 2019-05-22 NOTE — Progress Notes (Signed)
Physical Therapy Treatment Patient Details Name: Debra [name] MRN: [id number] DOB: 08-May-1930 Today's Date: 05/22/2019    History of Present Illness 86 y.o. female with past medical history of hypertension, hypercholesterolemia, diabetes, CKD,  and memory loss admitted to [facility] for 5/3 for slurred speech, facial droop (resolved on admission), 30 lb weight loss in a year. Abdominal imaging reveals extrahepatic biliary ductal dilatation and dilation of pancreatic duct, concern for possible malignancy, cholelithiasis. CVA workup negative, but MRI reveals 8 mm meningioma overlying the left frontal convexity without associated mass effect as well as cerebral atrophy.    PT Comments    Patient progressing with ambulation in room though noted HR up to 125 bpm though quickly recovered.  Daughter in the room and encouraging her to eat with milkshake from [employer].  Feel she remains appropriate for home with family support.  Will continue to follow.    Follow Up Recommendations  Home health PT;Supervision/Assistance - 24 hour     Equipment Recommendations  None recommended by PT    Recommendations for Other Services       Precautions / Restrictions Precautions Precautions: Fall    Mobility  Bed Mobility Overal bed mobility: Needs Assistance Bed Mobility: Supine to Sit     Supine to sit: Supervision;HOB elevated     General bed mobility comments: increased time, use of rail and cues for technique  Transfers Overall transfer level: Needs assistance Equipment used: Rolling walker (2 wheeled) Transfers: Sit to/from Stand Sit to Stand: Min assist;From elevated surface         General transfer comment: cues throughout for hand placement for safety, pt with decr balance when reaching for RW  Ambulation/Gait Ambulation/Gait assistance: Min guard;Min assist Gait Distance (Feet): 30 Feet Assistive device: Rolling walker (2 wheeled) Gait Pattern/deviations: Step-to  pattern;Step-through pattern;Decreased stride length;Shuffle;Trunk flexed     General Gait Details: assist for balance/safety able to manage RW in small space with min cueing.  HR max 125, down to low 100's within 1 min rest   Stairs             Wheelchair Mobility    Modified Rankin (Stroke Patients Only)       Balance Overall balance assessment: Needs assistance Sitting-balance support: Feet supported Sitting balance-Leahy Scale: Fair     Standing balance support: Bilateral upper extremity supported Standing balance-Leahy Scale: Poor Standing balance comment: UE support and min A this session for balance                            Cognition Arousal/Alertness: Awake/alert Behavior During Therapy: WFL for tasks assessed/performed Overall Cognitive Status: History of cognitive impairments - at baseline                                 General Comments: daughter in room near end of session and interacting with pt as normal      Exercises General Exercises - Upper Extremity Shoulder Flexion: AROM;5 reps;Supine;Both Elbow Extension: AROM;5 reps;Supine;Both General Exercises - Lower Extremity Ankle Circles/Pumps: AROM;10 reps;Supine;Both Heel Slides: AROM;5 reps;Supine;Both    General Comments        Pertinent Vitals/Pain Pain Assessment: No/denies pain    Home Living                      Prior Function  PT Goals (current goals can now be found in the care plan section) Progress towards PT goals: Progressing toward goals    Frequency    Min 3X/week      PT Plan Current plan remains appropriate    Co-evaluation              AM-PAC PT "6 Clicks" Mobility   Outcome Measure  Help needed turning from your back to your side while in a flat bed without using bedrails?: A Little Help needed moving from lying on your back to sitting on the side of a flat bed without using bedrails?: A Little Help  needed moving to and from a bed to a chair (including a wheelchair)?: A Little Help needed standing up from a chair using your arms (e.g., wheelchair or bedside chair)?: A Little Help needed to walk in hospital room?: A Little Help needed climbing 3-5 steps with a railing? : A Little 6 Click Score: 18    End of Session Equipment Utilized During Treatment: Gait belt Activity Tolerance: Patient tolerated treatment well Patient left: in chair;with call bell/phone within reach;with family/visitor present   PT Visit Diagnosis: Other abnormalities of gait and mobility (R26.89);Muscle weakness (generalized) (M62.81);History of falling (Z91.81)     Time: 1610-9604 PT Time Calculation (min) (ACUTE ONLY): 32 min  Charges:  $Gait Training: 8-22 mins $Therapeutic Exercise: 8-22 mins                     Magda Kiel, PT [place] [phone] 05/22/2019    Reginia Naas 05/22/2019, 4:04 PM

## 2019-05-22 NOTE — TOC Progression Note (Signed)
Transition of Care [facility]) - Progression Note    Patient Details  Name: Debra [name] MRN: [id number] Date of Birth: May 12, 1930  Transition of Care [facility]) CM/SW [place], RN Phone Number: 05/22/2019, 2:14 PM  Clinical Narrative: DME has been delivered to the room. Home health PT/OT [facility] aide have been set up through [facility]. Daughter at bedside made aware. No further needs noted at this time.        Expected Discharge Plan: [place] Barriers to Discharge: Continued Medical Work up  Expected Discharge Plan and Services Expected Discharge Plan: [place] In-house Referral: NA Discharge Planning Services: CM Consult Post Acute Care Choice: Durable Medical Equipment Living arrangements for the past 2 months: Single Family Home                 DME Arranged: 3-N-1, Gilford Rile DME Agency: Other - Comment([employer]) Date DME Agency Contacted: 05/21/19 Time DME Agency Contacted: [phone] Representative spoke with at DME Agency: Brenton Grills HH Arranged: NA [place] Agency: NA         Social Determinants of Health ([place]) Interventions    Readmission Risk Interventions No flowsheet data found.

## 2019-05-23 LAB — GLUCOSE, CAPILLARY: Glucose-Capillary: 123 mg/dL — ABNORMAL HIGH (ref 70–99)

## 2019-05-23 MED ORDER — AMOXICILLIN-POT CLAVULANATE 875-125 MG PO TABS: 1.0000 | ORAL_TABLET | Freq: Two times a day (BID) | ORAL | 0 refills | Status: AC

## 2019-05-23 MED ORDER — ACCU-CHEK COMBO KIT: 1.0000 "application " | PACK | Freq: Two times a day (BID) | 0 refills | Status: DC

## 2019-05-23 MED ORDER — SENNA 8.6 MG PO TABS: 1.0000 | ORAL_TABLET | Freq: Every evening | ORAL | 0 refills | Status: AC | PRN

## 2019-05-23 NOTE — Progress Notes (Signed)
Patient discharged home in care of daughter, Lelan Pons. Patient will follow-up with her PCP and receive HH. Antibiotic prescribed for the next 7 days.

## 2019-05-23 NOTE — Discharge Summary (Signed)
Physician Discharge Summary  Debra [name] VQQ:[id number] DOB: 11-20-1930 DOA: 05/18/2019  PCP: Asencion Noble, MD  Admit date: 05/18/2019 Discharge date: 05/23/2019  Admitted From: Home  Disposition: Home  Recommendations for Outpatient Follow-up:  1. Follow up with PCP in 1-2 weeks 2. Please obtain BMP/CBC in one week 3. 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  Reportedly had a slurred speech and left facial droop the morning of presentation that lasted about 30 minutes.  Family also noticed jaundice.  Reportedly had about 30 pounds weight loss over the last year.  Patient was found to have transaminases, elevated lipase 1132, CT head without acute finding.  CT abdomen and pelvis show pancreatic duct and intra and extrahepatic biliary duct dilation and cholelithiasis.  Sequently patient complaining of right upper arm weakness, reported to be new per family.  Neurology was consulted CVA work-up initiated.   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 She presented also with obstructive jaundice.  CT with profound intra and extrahepatic biliary duct dilation and slightly increased pancreatic duct dilation.  Concern about a small ampullary or pancreatic malignancy based on imaging. 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  Biopsy obtained.  The mass calluses ensured a broad distal CBD  stricture which stent was placed cholangitis noted. Continue with Zosyn GI is recommending 5 to 7 days of antibiotics given purulent cholangitis. Transition to Augmentin at discharge, 7 days.  Dr. Edison Nasuti place referral to oncology , out patient.  LFT trending down.   Hypotension; was hypotensive last night again. Continue with fluids. Support care.  Repeated hb stable.  Resolved  2-Right upper extremity weakness/transient left facial droop and slurred speech CVA work-up ; MRI head: No acute intracranial abnormality, mild chronic small vessel disease, 8 mm meningioma.  MR a head; atheromatous change about the carotid bifurcation stenosis of approximately 50% by NASCET criteria bilaterally Aspirin daily Need to start statin when liver function test improved Neurology following.  Diabetes: Continue with a sliding scale insulin Hypertension: Permissive hypertension continue with fluid, BP low, Antihypertensive stopped.  Lactic acidosis: Related to cholangitis.  Treated with IV fluids and Antibiotics.  AKI;on CKD possible stage IV;  increase cr to 1.5. prior cr per record at 2. HypoKalemia: Resolved.  Metabolic acidosis; continue with IV fluid. Hypomagnesemia; continue with IV magnesium supplement.  Normocytic anemia: Follow Mild Hyponatremia; continue with IV fluids.    Discharge Diagnoses:  Active Problems:   Dilation of biliary tract   Gallstone pancreatitis   Pancreatic duct dilated   Lactic acidosis   Acute cholangitis   Unintentional weight loss   Slurred speech   HLD (hyperlipidemia)   HTN (hypertension)   DM (diabetes mellitus), type 2 (HCC)   Hypokalemia   TIA (transient ischemic attack)   Protein-calorie malnutrition, severe    Discharge Instructions  Discharge Instructions    Diet - low sodium heart healthy   Complete by: As directed    Increase activity slowly   Complete by: As directed      Allergies as  of 05/23/2019      Reactions   Other Nausea And  Vomiting   Diet Sodas. Sugar-free jello      Medication List    STOP taking these medications   amLODipine 10 MG tablet Commonly known as: NORVASC   ammonium lactate 12 % cream Commonly known as: AMLACTIN   atorvastatin 80 MG tablet Commonly known as: LIPITOR   colchicine 0.6 MG tablet   docusate sodium 100 MG capsule Commonly known as: COLACE   hydrochlorothiazide 25 MG tablet Commonly known as: HYDRODIURIL   labetalol 100 MG tablet Commonly known as: NORMODYNE   losartan 100 MG tablet Commonly known as: COZAAR   meclizine 25 MG tablet Commonly known as: ANTIVERT     TAKE these medications   Accu-Chek Combo Kit 1 application by Does not apply route 2 (two) times daily.   amoxicillin-clavulanate 875-125 MG tablet Commonly known as: Augmentin Take 1 tablet by mouth 2 (two) times daily for 7 days.   aspirin EC 81 MG tablet Take 81 mg by mouth daily.   latanoprost 0.005 % ophthalmic solution Commonly known as: XALATAN Place 1 drop into both eyes at bedtime.   metFORMIN 500 MG 24 hr tablet Commonly known as: GLUCOPHAGE-XR Take 500 mg by mouth 2 (two) times daily.   senna 8.6 MG Tabs tablet Commonly known as: SENOKOT Take 1 tablet (8.6 mg total) by mouth at bedtime as needed for mild constipation.   triamcinolone 0.025 % cream Commonly known as: KENALOG Apply 1 application topically 2 (two) times daily as needed.            Durable Medical Equipment  (From admission, onward)         Start     Ordered   05/21/19 1501  For home use only DME 3 n 1  Once     05/21/19 1501   05/21/19 1501  For home use only DME Walker rolling  Once    Question Answer Comment  Walker: With 5 Inch Wheels   Patient needs a walker to treat with the following condition Weakness      05/21/19 1501         Follow-up Information    [facility]. Schedule an appointment as soon as possible for a visit in 4 week(s).   Specialty: Neurology Contact  information: [address] [place], [facility] Follow up.   Specialty: Home Health Services Why: Your home health has been set up with [facility]. The agency will contact you. If you have any questions or concerns please call number listed above.  Contact information: [address] [place] [address] [phone]        Asencion Noble, MD Follow up in 1 week(s).   Specialty: Internal Medicine Contact information: [address] [place] [address] [phone]        Milus Banister, MD Follow up.   Specialty: Gastroenterology Why: call office to get appointment withoncology.  Contact information: [address] [place] [address] [phone]          Allergies  Allergen Reactions  . Other Nausea And Vomiting    Diet Sodas. Sugar-free jello    Consultations:  GI   Procedures/Studies: CT Head Wo Contrast  Result Date: 05/18/2019 CLINICAL DATA:  Ataxia with stroke suspected EXAM: CT HEAD WITHOUT CONTRAST TECHNIQUE: Contiguous axial images were obtained from the base of the  skull through the vertex without intravenous contrast. COMPARISON:  02/27/2019 FINDINGS: Brain: No evidence of acute infarction, hemorrhage, hydrocephalus, extra-axial collection or mass lesion/mass effect. Chronic small vessel ischemia in the periventricular white matter. Cerebral volume loss with ventriculomegaly Vascular: No hyperdense vessel or unexpected calcification. Skull: Normal. Negative for fracture or focal lesion. Sinuses/Orbits: No acute finding. IMPRESSION: 1. No acute finding. 2. Atrophy and chronic small vessel ischemia. Electronically Signed   By: Monte Fantasia M.D.   On: 05/18/2019 10:37   MR ANGIO HEAD WO CONTRAST  Result Date: 05/19/2019 CLINICAL DATA:  Initial evaluation for acute slurred speech, left-sided facial droop. EXAM: MR HEAD WITHOUT CONTRAST MR CIRCLE OF WILLIS WITHOUT  CONTRAST MRA OF THE NECK WITHOUT AND WITH CONTRAST TECHNIQUE: Multiplanar, multiecho pulse sequences of the brain, circle of Willis and surrounding structures were obtained without intravenous contrast. Angiographic images of the neck were obtained using MRA technique without and with intravenous contrast. CONTRAST:  6.5 cc of Gadavist. COMPARISON:  Prior head CT from 05/18/2019. FINDINGS: MRI HEAD FINDINGS Brain: Diffuse prominence of the CSF containing spaces compatible with generalized age-related cerebral atrophy. Patchy and confluent T2/FLAIR hyperintensity within the periventricular and deep white matter both cerebral hemispheres most consistent with chronic small vessel ischemic disease, mild for age. No abnormal foci of restricted diffusion to suggest acute or subacute ischemia. Gray-white matter differentiation maintained. No encephalomalacia to suggest chronic cortical infarction. No evidence for acute or chronic intracranial hemorrhage. 8 mm meningioma overlies the left frontal convexity without associated mass effect (series 6, image 15). No other mass lesion, mass effect, or midline shift. Diffuse ventricular prominence related to global parenchymal volume loss without hydrocephalus. No extra-axial fluid collection. Note made of a partially empty sella. Midline structures intact. Vascular: Major intracranial vascular flow voids are maintained. Skull and upper cervical spine: Craniocervical junction within normal limits. Multilevel degenerative spondylolysis noted within the upper cervical spine with resultant moderate spinal stenosis at the level of C3-4. Bone marrow signal intensity within normal limits. No focal marrow replacing lesion. No scalp soft tissue abnormality. Sinuses/Orbits: Patient status post bilateral ocular lens replacement. Small amount of pneumatized secretions noted within the left sphenoid sinus. Paranasal sinuses are otherwise largely clear. No significant mastoid effusion. Inner  ear structures grossly normal. Other: None. MRA HEAD FINDINGS ANTERIOR CIRCULATION: Visualized distal cervical segments of the internal carotid arteries are patent with symmetric antegrade flow. Petrous, cavernous, and supraclinoid ICAs patent without flow-limiting stenosis. A1 segments widely patent. Normal anterior communicating artery complex. Anterior cerebral arteries patent to their distal aspects without stenosis. No M1 stenosis or occlusion. Normal MCA bifurcations. Distal MCA branches well perfused and symmetric. POSTERIOR CIRCULATION: Partially visualized vertebral arteries widely patent. Right vertebral artery dominant. Both picas patent. Basilar patent to its distal aspect without stenosis. Superior cerebral arteries patent bilaterally. Right PCA supplied via the basilar. Predominant fetal type origin of the left PCA. Both PCAs well perfused to their distal aspects. No intracranial aneurysm. MRA NECK FINDINGS: AORTIC ARCH: Examination degraded by motion artifact. Visualized aortic arch of normal caliber with normal 3 vessel morphology. No flow-limiting stenosis about the origin of the great vessels. Visualized subclavian arteries widely patent. RIGHT CAROTID SYSTEM: Right CCA tortuous but widely patent to the bifurcation without stenosis. Atheromatous irregularity about the right carotid bifurcation with associated stenosis of up to approximate 50% by NASCET criteria. Right ICA patent distally to the skull base without stenosis or occlusion. LEFT CAROTID SYSTEM: Left CCA patent from its origin to the bifurcation without stenosis.  Atheromatous irregularity about the left carotid bifurcation with associated stenosis of up to approximately 50% by NASCET criteria. Left ICA otherwise patent distally to the skull base without stenosis or occlusion. VERTEBRAL ARTERIES: Both vertebral arteries arise from the subclavian arteries. Right vertebral artery dominant. Vertebral arteries not well assessed proximally due  to motion. Visualized portions are mildly tortuous but widely patent without stenosis or occlusion. IMPRESSION: MRI HEAD IMPRESSION: 1. No acute intracranial infarct or other abnormality. 2. Generalized age advanced cerebral atrophy with mild chronic small vessel ischemic disease. 3. 8 mm meningioma overlying the left frontal convexity without associated mass effect. MRA HEAD IMPRESSION: Negative intracranial MRA with no large vessel occlusion. No hemodynamically significant or correctable stenosis. MRA NECK IMPRESSION: 1. Atheromatous change about the carotid bifurcations with estimated stenoses of up to approximately 50% by NASCET criteria bilaterally. 2. Patent vertebral arteries within the neck. Right vertebral artery dominant. Electronically Signed   By: Jeannine Boga M.D.   On: 05/19/2019 20:56   MR ANGIO NECK W WO CONTRAST  Result Date: 05/19/2019 CLINICAL DATA:  Initial evaluation for acute slurred speech, left-sided facial droop. EXAM: MR HEAD WITHOUT CONTRAST MR CIRCLE OF WILLIS WITHOUT CONTRAST MRA OF THE NECK WITHOUT AND WITH CONTRAST TECHNIQUE: Multiplanar, multiecho pulse sequences of the brain, circle of Willis and surrounding structures were obtained without intravenous contrast. Angiographic images of the neck were obtained using MRA technique without and with intravenous contrast. CONTRAST:  6.5 cc of Gadavist. COMPARISON:  Prior head CT from 05/18/2019. FINDINGS: MRI HEAD FINDINGS Brain: Diffuse prominence of the CSF containing spaces compatible with generalized age-related cerebral atrophy. Patchy and confluent T2/FLAIR hyperintensity within the periventricular and deep white matter both cerebral hemispheres most consistent with chronic small vessel ischemic disease, mild for age. No abnormal foci of restricted diffusion to suggest acute or subacute ischemia. Gray-white matter differentiation maintained. No encephalomalacia to suggest chronic cortical infarction. No evidence for acute  or chronic intracranial hemorrhage. 8 mm meningioma overlies the left frontal convexity without associated mass effect (series 6, image 15). No other mass lesion, mass effect, or midline shift. Diffuse ventricular prominence related to global parenchymal volume loss without hydrocephalus. No extra-axial fluid collection. Note made of a partially empty sella. Midline structures intact. Vascular: Major intracranial vascular flow voids are maintained. Skull and upper cervical spine: Craniocervical junction within normal limits. Multilevel degenerative spondylolysis noted within the upper cervical spine with resultant moderate spinal stenosis at the level of C3-4. Bone marrow signal intensity within normal limits. No focal marrow replacing lesion. No scalp soft tissue abnormality. Sinuses/Orbits: Patient status post bilateral ocular lens replacement. Small amount of pneumatized secretions noted within the left sphenoid sinus. Paranasal sinuses are otherwise largely clear. No significant mastoid effusion. Inner ear structures grossly normal. Other: None. MRA HEAD FINDINGS ANTERIOR CIRCULATION: Visualized distal cervical segments of the internal carotid arteries are patent with symmetric antegrade flow. Petrous, cavernous, and supraclinoid ICAs patent without flow-limiting stenosis. A1 segments widely patent. Normal anterior communicating artery complex. Anterior cerebral arteries patent to their distal aspects without stenosis. No M1 stenosis or occlusion. Normal MCA bifurcations. Distal MCA branches well perfused and symmetric. POSTERIOR CIRCULATION: Partially visualized vertebral arteries widely patent. Right vertebral artery dominant. Both picas patent. Basilar patent to its distal aspect without stenosis. Superior cerebral arteries patent bilaterally. Right PCA supplied via the basilar. Predominant fetal type origin of the left PCA. Both PCAs well perfused to their distal aspects. No intracranial aneurysm. MRA NECK  FINDINGS: AORTIC ARCH: Examination degraded by  motion artifact. Visualized aortic arch of normal caliber with normal 3 vessel morphology. No flow-limiting stenosis about the origin of the great vessels. Visualized subclavian arteries widely patent. RIGHT CAROTID SYSTEM: Right CCA tortuous but widely patent to the bifurcation without stenosis. Atheromatous irregularity about the right carotid bifurcation with associated stenosis of up to approximate 50% by NASCET criteria. Right ICA patent distally to the skull base without stenosis or occlusion. LEFT CAROTID SYSTEM: Left CCA patent from its origin to the bifurcation without stenosis. Atheromatous irregularity about the left carotid bifurcation with associated stenosis of up to approximately 50% by NASCET criteria. Left ICA otherwise patent distally to the skull base without stenosis or occlusion. VERTEBRAL ARTERIES: Both vertebral arteries arise from the subclavian arteries. Right vertebral artery dominant. Vertebral arteries not well assessed proximally due to motion. Visualized portions are mildly tortuous but widely patent without stenosis or occlusion. IMPRESSION: MRI HEAD IMPRESSION: 1. No acute intracranial infarct or other abnormality. 2. Generalized age advanced cerebral atrophy with mild chronic small vessel ischemic disease. 3. 8 mm meningioma overlying the left frontal convexity without associated mass effect. MRA HEAD IMPRESSION: Negative intracranial MRA with no large vessel occlusion. No hemodynamically significant or correctable stenosis. MRA NECK IMPRESSION: 1. Atheromatous change about the carotid bifurcations with estimated stenoses of up to approximately 50% by NASCET criteria bilaterally. 2. Patent vertebral arteries within the neck. Right vertebral artery dominant. Electronically Signed   By: Jeannine Boga M.D.   On: 05/19/2019 20:56   MR BRAIN WO CONTRAST  Result Date: 05/19/2019 CLINICAL DATA:  Initial evaluation for acute slurred  speech, left-sided facial droop. EXAM: MR HEAD WITHOUT CONTRAST MR CIRCLE OF WILLIS WITHOUT CONTRAST MRA OF THE NECK WITHOUT AND WITH CONTRAST TECHNIQUE: Multiplanar, multiecho pulse sequences of the brain, circle of Willis and surrounding structures were obtained without intravenous contrast. Angiographic images of the neck were obtained using MRA technique without and with intravenous contrast. CONTRAST:  6.5 cc of Gadavist. COMPARISON:  Prior head CT from 05/18/2019. FINDINGS: MRI HEAD FINDINGS Brain: Diffuse prominence of the CSF containing spaces compatible with generalized age-related cerebral atrophy. Patchy and confluent T2/FLAIR hyperintensity within the periventricular and deep white matter both cerebral hemispheres most consistent with chronic small vessel ischemic disease, mild for age. No abnormal foci of restricted diffusion to suggest acute or subacute ischemia. Gray-white matter differentiation maintained. No encephalomalacia to suggest chronic cortical infarction. No evidence for acute or chronic intracranial hemorrhage. 8 mm meningioma overlies the left frontal convexity without associated mass effect (series 6, image 15). No other mass lesion, mass effect, or midline shift. Diffuse ventricular prominence related to global parenchymal volume loss without hydrocephalus. No extra-axial fluid collection. Note made of a partially empty sella. Midline structures intact. Vascular: Major intracranial vascular flow voids are maintained. Skull and upper cervical spine: Craniocervical junction within normal limits. Multilevel degenerative spondylolysis noted within the upper cervical spine with resultant moderate spinal stenosis at the level of C3-4. Bone marrow signal intensity within normal limits. No focal marrow replacing lesion. No scalp soft tissue abnormality. Sinuses/Orbits: Patient status post bilateral ocular lens replacement. Small amount of pneumatized secretions noted within the left sphenoid  sinus. Paranasal sinuses are otherwise largely clear. No significant mastoid effusion. Inner ear structures grossly normal. Other: None. MRA HEAD FINDINGS ANTERIOR CIRCULATION: Visualized distal cervical segments of the internal carotid arteries are patent with symmetric antegrade flow. Petrous, cavernous, and supraclinoid ICAs patent without flow-limiting stenosis. A1 segments widely patent. Normal anterior communicating artery complex. Anterior cerebral arteries  patent to their distal aspects without stenosis. No M1 stenosis or occlusion. Normal MCA bifurcations. Distal MCA branches well perfused and symmetric. POSTERIOR CIRCULATION: Partially visualized vertebral arteries widely patent. Right vertebral artery dominant. Both picas patent. Basilar patent to its distal aspect without stenosis. Superior cerebral arteries patent bilaterally. Right PCA supplied via the basilar. Predominant fetal type origin of the left PCA. Both PCAs well perfused to their distal aspects. No intracranial aneurysm. MRA NECK FINDINGS: AORTIC ARCH: Examination degraded by motion artifact. Visualized aortic arch of normal caliber with normal 3 vessel morphology. No flow-limiting stenosis about the origin of the great vessels. Visualized subclavian arteries widely patent. RIGHT CAROTID SYSTEM: Right CCA tortuous but widely patent to the bifurcation without stenosis. Atheromatous irregularity about the right carotid bifurcation with associated stenosis of up to approximate 50% by NASCET criteria. Right ICA patent distally to the skull base without stenosis or occlusion. LEFT CAROTID SYSTEM: Left CCA patent from its origin to the bifurcation without stenosis. Atheromatous irregularity about the left carotid bifurcation with associated stenosis of up to approximately 50% by NASCET criteria. Left ICA otherwise patent distally to the skull base without stenosis or occlusion. VERTEBRAL ARTERIES: Both vertebral arteries arise from the subclavian  arteries. Right vertebral artery dominant. Vertebral arteries not well assessed proximally due to motion. Visualized portions are mildly tortuous but widely patent without stenosis or occlusion. IMPRESSION: MRI HEAD IMPRESSION: 1. No acute intracranial infarct or other abnormality. 2. Generalized age advanced cerebral atrophy with mild chronic small vessel ischemic disease. 3. 8 mm meningioma overlying the left frontal convexity without associated mass effect. MRA HEAD IMPRESSION: Negative intracranial MRA with no large vessel occlusion. No hemodynamically significant or correctable stenosis. MRA NECK IMPRESSION: 1. Atheromatous change about the carotid bifurcations with estimated stenoses of up to approximately 50% by NASCET criteria bilaterally. 2. Patent vertebral arteries within the neck. Right vertebral artery dominant. Electronically Signed   By: Jeannine Boga M.D.   On: 05/19/2019 20:56   CT ABDOMEN PELVIS W CONTRAST  Result Date: 05/18/2019 CLINICAL DATA:  Abdominal pain, neutropenia, elevated LFTs EXAM: CT ABDOMEN AND PELVIS WITH CONTRAST TECHNIQUE: Multidetector CT imaging of the abdomen and pelvis was performed using the standard protocol following bolus administration of intravenous contrast. CONTRAST:  42m OMNIPAQUE IOHEXOL 300 MG/ML  SOLN COMPARISON:  03/20/2019 FINDINGS: Lower chest: No acute abnormality. Three-vessel coronary artery calcifications. Hepatobiliary: Redemonstrated profound intra and extrahepatic biliary ductal dilatation to the ampulla, which is increased compared prior examination, the common bile duct measuring up to 1.6 cm. The gallbladder is distended and contains numerous tiny gallstones. Pancreas: The pancreatic duct is profoundly dilated along its length to the ampulla, measuring up to 1.4 cm centrally. Spleen: Normal in size without significant abnormality. Adrenals/Urinary Tract: Adrenal glands are unremarkable. Kidneys are normal, without renal calculi, solid  lesion, or hydronephrosis. Bladder is unremarkable. Stomach/Bowel: Stomach is within normal limits. Appendix appears normal. No evidence of bowel wall thickening, distention, or inflammatory changes. Vascular/Lymphatic: Aortic atherosclerosis. No enlarged abdominal or pelvic lymph nodes. Reproductive: No mass or other significant abnormality. Other: No abdominal wall hernia or abnormality. No abdominopelvic ascites. Musculoskeletal: No acute or significant osseous findings. IMPRESSION: 1. 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 2.  Cholelithiasis. 3.  Coronary artery disease.  Aortic Atherosclerosis (ICD10-I70.0). Electronically Signed   By: ACristie Hem  Laqueta Carina M.D.   On: 05/18/2019 11:15   DG Chest Port 1 View  Result Date: 05/18/2019 CLINICAL DATA:  Weakness and slurred speech. EXAM: PORTABLE CHEST 1 VIEW COMPARISON:  01/28/2017. FINDINGS: Trachea is midline. Heart size normal. Thoracic aorta is calcified. Lungs are clear. No pleural fluid. IMPRESSION: 1. No acute findings. 2.  Aortic atherosclerosis (ICD10-I70.0). Electronically Signed   By: Lorin Picket M.D.   On: 05/18/2019 10:26   DG ERCP BILIARY & PANCREATIC DUCTS  Result Date: 05/20/2019 CLINICAL DATA:  Biliary and pancreatic ductal dilatation. EXAM: ERCP TECHNIQUE: Multiple spot images obtained with the fluoroscopic device and submitted for interpretation post-procedure. COMPARISON:  CT of the abdomen and pelvis on 05/18/2019 FINDINGS: Imaging obtained with a C-arm during the endoscopic procedure demonstrates cannulation of the common bile duct with contrast injection demonstrating diffuse and massive dilatation of the common bile duct as well as visualized intrahepatic ducts. A self expanding metallic biliary stent was  placed in the distal CBD. IMPRESSION: Biliary obstruction treated with placement of a self expanding metallic stent in the distal CBD. These images were submitted for radiologic interpretation only. Please see the procedural report for the amount of contrast and the fluoroscopy time utilized. Electronically Signed   By: Aletta Edouard M.D.   On: 05/20/2019 16:13   ECHOCARDIOGRAM COMPLETE  Result Date: 05/19/2019    ECHOCARDIOGRAM REPORT   Patient Name:   Debra [name] Date of Exam: 05/19/2019 Medical Rec #:  [id number]          Height:       67.0 in Accession #:    [id number]         Weight:       145.0 lb Date of Birth:  November 22, 1930          BSA:          1.764 m Patient Age:    63 years           BP:           103/58 mmHg Patient Gender: F                  HR:           92 bpm. Exam Location:  Inpatient Procedure: 2D Echo Indications:    stroke like symptom  History:        Patient has no prior history of Echocardiogram examinations.                 Risk Factors:Diabetes, Dyslipidemia and Hypertension.  Sonographer:    Johny Chess Referring Phys: [id number] TAYE T GONFA IMPRESSIONS  1. Left ventricular ejection fraction, by estimation, is 60 to 65%. The left ventricle has normal function. The left ventricle has no regional wall motion abnormalities. Left ventricular diastolic parameters were normal.  2. Right ventricular systolic function is normal. The right ventricular size is normal.  3. The mitral valve is normal in structure. Trivial mitral valve regurgitation. No evidence of mitral stenosis.  4. The aortic valve is tricuspid. Aortic valve regurgitation is not visualized. Mild to moderate aortic valve sclerosis/calcification is present, without any evidence of aortic stenosis.  5. The inferior vena cava is normal in size with greater than 50% respiratory variability, suggesting right atrial pressure of 3 mmHg. Comparison(s): No prior Echocardiogram. FINDINGS  Left Ventricle: Left ventricular ejection  fraction, by estimation, is 60 to 65%. The left ventricle has normal function. The left ventricle has no regional wall motion abnormalities. The left  ventricular internal cavity size was normal in size. There is  no left ventricular hypertrophy. Left ventricular diastolic parameters were normal. Right Ventricle: The right ventricular size is normal. No increase in right ventricular wall thickness. Right ventricular systolic function is normal. Left Atrium: Left atrial size was normal in size. Right Atrium: Right atrial size was normal in size. Pericardium: There is no evidence of pericardial effusion. Mitral Valve: The mitral valve is normal in structure. Trivial mitral valve regurgitation. No evidence of mitral valve stenosis. Tricuspid Valve: The tricuspid valve is normal in structure. Tricuspid valve regurgitation is not demonstrated. No evidence of tricuspid stenosis. Aortic Valve: The aortic valve is tricuspid. . There is moderate thickening and moderate calcification of the aortic valve. Aortic valve regurgitation is not visualized. Mild to moderate aortic valve sclerosis/calcification is present, without any evidence of aortic stenosis. Moderate aortic valve annular calcification. There is moderate thickening of the aortic valve. There is moderate calcification of the aortic valve. Pulmonic Valve: The pulmonic valve was not well visualized. Pulmonic valve regurgitation is not visualized. No evidence of pulmonic stenosis. Aorta: The aortic root was not well visualized and the ascending aorta was not well visualized. Venous: The inferior vena cava is normal in size with greater than 50% respiratory variability, suggesting right atrial pressure of 3 mmHg. IAS/Shunts: No atrial level shunt detected by color flow Doppler.  LEFT VENTRICLE PLAX 2D LVIDd:         3.00 cm  Diastology LVIDs:         2.05 cm  LV e' lateral: 11.40 cm/s LV PW:         0.80 cm  LV e' medial:  6.96 cm/s LV IVS:        0.90 cm LVOT diam:      1.70 cm LV SV:         42 LV SV Index:   24 LVOT Area:     2.27 cm  RIGHT VENTRICLE RV S prime:     12.80 cm/s TAPSE (M-mode): 1.5 cm LEFT ATRIUM             Index       RIGHT ATRIUM          Index LA diam:        2.70 cm 1.53 cm/m  RA Area:     8.60 cm LA Vol (A2C):   38.2 ml 21.66 ml/m RA Volume:   15.10 ml 8.56 ml/m LA Vol (A4C):   32.7 ml 18.54 ml/m LA Biplane Vol: 35.4 ml 20.07 ml/m  AORTIC VALVE LVOT Vmax:   104.00 cm/s LVOT Vmean:  69.400 cm/s LVOT VTI:    0.183 m  AORTA Ao Root diam: 2.90 cm  SHUNTS Systemic VTI:  0.18 m Systemic Diam: 1.70 cm Buford Dresser MD Electronically signed by Buford Dresser MD Signature Date/Time: 05/19/2019/8:44:09 PM    Final    VAS [place] LOWER EXTREMITY VENOUS (DVT)  Result Date: 05/21/2019  Lower Venous DVTStudy Other Indications: TIA/ new diagnosis cancer. Comparison Study: none Performing Technologist: June Leap RDMS, RVT  Examination Guidelines: A complete evaluation includes B-mode imaging, spectral Doppler, color Doppler, and power Doppler as needed of all accessible portions of each vessel. Bilateral testing is considered an integral part of a complete examination. Limited examinations for reoccurring indications may be performed as noted. The reflux portion of the exam is performed with the patient in reverse Trendelenburg.  +---------+---------------+---------+-----------+----------+--------------+ RIGHT    CompressibilityPhasicitySpontaneityPropertiesThrombus Aging +---------+---------------+---------+-----------+----------+--------------+ CFV  Full           Yes      Yes                                 +---------+---------------+---------+-----------+----------+--------------+ SFJ      Full                                                        +---------+---------------+---------+-----------+----------+--------------+ FV Prox  Full                                                         +---------+---------------+---------+-----------+----------+--------------+ FV Mid   Full                                                        +---------+---------------+---------+-----------+----------+--------------+ FV DistalFull                                                        +---------+---------------+---------+-----------+----------+--------------+ PFV      Full                                                        +---------+---------------+---------+-----------+----------+--------------+ POP      Full           Yes      Yes                                 +---------+---------------+---------+-----------+----------+--------------+ PTV      Full                                                        +---------+---------------+---------+-----------+----------+--------------+ PERO     Full                                                        +---------+---------------+---------+-----------+----------+--------------+   +---------+---------------+---------+-----------+----------+--------------+ LEFT     CompressibilityPhasicitySpontaneityPropertiesThrombus Aging +---------+---------------+---------+-----------+----------+--------------+ CFV      Full           Yes      Yes                                 +---------+---------------+---------+-----------+----------+--------------+  SFJ      Full                                                        +---------+---------------+---------+-----------+----------+--------------+ FV Prox  Full                                                        +---------+---------------+---------+-----------+----------+--------------+ FV Mid   Full                                                        +---------+---------------+---------+-----------+----------+--------------+ FV DistalFull                                                         +---------+---------------+---------+-----------+----------+--------------+ PFV      Full                                                        +---------+---------------+---------+-----------+----------+--------------+ POP      Full           Yes      Yes                                 +---------+---------------+---------+-----------+----------+--------------+ PTV      Full                                                        +---------+---------------+---------+-----------+----------+--------------+ PERO     Full                                                        +---------+---------------+---------+-----------+----------+--------------+     Summary: RIGHT: - There is no evidence of deep vein thrombosis in the lower extremity.  - No cystic structure found in the popliteal fossa.  LEFT: - There is no evidence of deep vein thrombosis in the lower extremity.  - No cystic structure found in the popliteal fossa.  *See table(s) above for measurements and observations. Electronically signed by Servando Snare MD on 05/21/2019 at 5:26:54 PM.    Final     Subjective: Feeling well, denies pain   Discharge Exam: Vitals:   05/23/19 0253 05/23/19 0744  BP: (!) 99/54 (!) 108/50  Pulse: 87 69  Resp: 20 16  Temp: 98.3 F (36.8 C) 98.1 F (36.7 C)  SpO2:  100%     General: Pt is alert, awake, not in acute distress Cardiovascular: RRR, S1/S2 +, no rubs, no gallops Respiratory: CTA bilaterally, no wheezing, no rhonchi Abdominal: Soft, NT, ND, bowel sounds + Extremities: no edema, no cyanosis    The results of significant diagnostics from this hospitalization (including imaging, microbiology, ancillary and laboratory) are listed below for reference.     Microbiology: Recent Results (from the past 240 hour(s))  Respiratory Panel by RT PCR (Flu A&B, Covid) - Urine, Catheterized     Status: None   Collection Time: 05/18/19 10:47 AM   Specimen: Urine, Catheterized  Result  Value Ref Range Status   SARS Coronavirus 2 by RT PCR NEGATIVE NEGATIVE Final    Comment: (NOTE) SARS-CoV-2 target nucleic acids are NOT DETECTED. The SARS-CoV-2 RNA is generally detectable in upper respiratoy specimens during the acute phase of infection. The lowest concentration of SARS-CoV-2 viral copies this assay can detect is 131 copies/mL. A negative result does not preclude SARS-Cov-2 infection and should not be used as the sole basis for treatment or other patient management decisions. A negative result may occur with  improper specimen collection/handling, submission of specimen other than nasopharyngeal swab, presence of viral mutation(s) within the areas targeted by this assay, and inadequate number of viral copies (<131 copies/mL). A negative result must be combined with clinical observations, patient history, and epidemiological information. The expected result is Negative. Fact Sheet for Patients:  PinkCheek.be Fact Sheet for Healthcare Providers:  GravelBags.it This test is not yet ap proved or cleared by the [place] FDA and  has been authorized for detection and/or diagnosis of SARS-CoV-2 by FDA under an Emergency Use Authorization (EUA). This EUA will remain  in effect (meaning this test can be used) for the duration of the COVID-19 declaration under Section 564(b)(1) of the Act, 21 U.S.C. section 360bbb-3(b)(1), unless the authorization is terminated or revoked sooner.    Influenza A by PCR NEGATIVE NEGATIVE Final   Influenza B by PCR NEGATIVE NEGATIVE Final    Comment: (NOTE) The Xpert Xpress SARS-CoV-2/FLU/RSV assay is intended as an aid in  the diagnosis of influenza from Nasopharyngeal swab specimens and  should not be used as a sole basis for treatment. Nasal washings and  aspirates are unacceptable for Xpert Xpress SARS-CoV-2/FLU/RSV  testing. Fact Sheet for  Patients: PinkCheek.be Fact Sheet for Healthcare Providers: GravelBags.it This test is not yet approved or cleared by the [place] FDA and  has been authorized for detection and/or diagnosis of SARS-CoV-2 by  FDA under an Emergency Use Authorization (EUA). This EUA will remain  in effect (meaning this test can be used) for the duration of the  Covid-19 declaration under Section 564(b)(1) of the Act, 21  U.S.C. section 360bbb-3(b)(1), unless the authorization is  terminated or revoked. Performed at [facility], [address], [place], [place] [address]   Urine Culture     Status: Abnormal   Collection Time: 05/18/19 11:07 AM   Specimen: Urine, Clean Catch  Result Value Ref Range Status   Specimen Description   Final    URINE, CLEAN CATCH Performed at [facility], [address], [place], [place] [address]    Special Requests   Final    NONE Performed at [facility], [address], [place], [place] [address]    Culture (A)  Final    <10,000 COLONIES/mL INSIGNIFICANT GROWTH Performed at [facility], [address] [place],  [address]    Report Status 05/19/2019 FINAL  Final     Labs: BNP (last 3 results) No results for input(s): BNP in the last 8760 hours. Basic Metabolic Panel: Recent Labs  Lab 05/18/19 0941 05/18/19 2258 05/19/19 0409 05/19/19 0409 05/20/19 0420 05/20/19 1212 05/20/19 1449 05/21/19 0346 05/22/19 0347  NA 131*  --  131*   < > 131* 132* 133* 132* 133*  K 3.1*  --  3.2*   < > 2.8* 3.3* 3.6 4.0 3.9  CL 95*  --  100   < > 98 97* 101 102 104  CO2 23  --  19*  --  19* 21*  --  18* 21*  GLUCOSE 209*  --  119*   < > 94 92 81 176* 144*  BUN 25*  --  15   < > '13 13 13 14 15  ' CREATININE 1.49*   < > 1.41*   < > 1.39* 1.34* 1.20* 1.54* 1.52*  CALCIUM 8.0*  --  8.0*  --  8.2* 8.1*  --  8.0* 7.6*  MG 1.7  --   --   --  1.4* 1.6*  --  1.6*  --   PHOS  --   --   --   --  3.4  --   --    --   --    < > = values in this interval not displayed.   Liver Function Tests: Recent Labs  Lab 05/18/19 0941 05/18/19 0941 05/19/19 0409 05/19/19 1515 05/20/19 0420 05/21/19 0346 05/22/19 0818  AST 478*  --  463*  --  491* 314* 172*  ALT 175*  --  179*  --  181* 149* 112*  ALKPHOS 652*  --  607*  --  701* 595* 448*  BILITOT 6.9*   < > 7.4* 8.0* 8.3* 5.7* 2.9*  PROT 5.7*  --  6.0*  --  5.9* 5.6* 5.5*  ALBUMIN 1.7*  --  1.5*  --  1.4* 1.2* 1.3*   < > = values in this interval not displayed.   Recent Labs  Lab 05/18/19 0941 05/19/19 0409 05/20/19 0420  LIPASE 1,132* 996* 602*   No results for input(s): AMMONIA in the last 168 hours. CBC: Recent Labs  Lab 05/18/19 0941 05/18/19 0941 05/18/19 2258 05/18/19 2258 05/19/19 0409 05/19/19 0409 05/20/19 0420 05/20/19 1449 05/21/19 0346 05/21/19 1612 05/22/19 0818  WBC 8.2   < > 8.4  --  7.9  --  7.1  --  5.2  --  9.4  NEUTROABS 6.1  --   --   --   --   --  4.8  --   --   --   --   HGB 8.6*   < > 9.5*   < > 9.2*   < > 9.7* 10.5* 8.5* 8.0* 8.2*  HCT 26.1*   < > 28.0*   < > 26.8*   < > 28.2* 31.0* 25.8* 23.7* 24.5*  MCV 92.6   < > 90.3  --  89.6  --  89.5  --  91.8  --  92.5  PLT 499*   < > 601*  --  538*  --  513*  --  441*  --  456*   < > = values in this interval not displayed.   Cardiac Enzymes: Recent Labs  Lab 05/19/19 1515  CKTOTAL 550*   BNP: Invalid input(s): POCBNP CBG: Recent Labs  Lab 05/22/19 0739 05/22/19 1221 05/22/19 1726 05/22/19 2032 05/23/19 0742  GLUCAP 113* 163* 141* 207* 123*   D-Dimer No results for input(s): DDIMER in the last 72 hours. Hgb A1c No results for input(s): HGBA1C in the last 72 hours. Lipid Profile No results for input(s): CHOL, HDL, LDLCALC, TRIG, CHOLHDL, LDLDIRECT in the last 72 hours. Thyroid function studies No results for input(s): TSH, T4TOTAL, T3FREE, THYROIDAB in the last 72 hours.  Invalid input(s): FREET3 Anemia work up No results for input(s):  VITAMINB12, FOLATE, FERRITIN, TIBC, IRON, RETICCTPCT in the last 72 hours. Urinalysis    Component Value Date/Time   COLORURINE AMBER (A) 05/18/2019 1046   APPEARANCEUR HAZY (A) 05/18/2019 1046   LABSPEC 1.012 05/18/2019 1046   PHURINE 5.0 05/18/2019 1046   GLUCOSEU NEGATIVE 05/18/2019 1046   HGBUR LARGE (A) 05/18/2019 1046   BILIRUBINUR SMALL (A) 05/18/2019 1046   KETONESUR NEGATIVE 05/18/2019 1046   PROTEINUR 30 (A) 05/18/2019 1046   NITRITE NEGATIVE 05/18/2019 1046   LEUKOCYTESUR NEGATIVE 05/18/2019 1046   Sepsis Labs Invalid input(s): PROCALCITONIN,  WBC,  LACTICIDVEN Microbiology Recent Results (from the past 240 hour(s))  Respiratory Panel by RT PCR (Flu A&B, Covid) - Urine, Catheterized     Status: None   Collection Time: 05/18/19 10:47 AM   Specimen: Urine, Catheterized  Result Value Ref Range Status   SARS Coronavirus 2 by RT PCR NEGATIVE NEGATIVE Final    Comment: (NOTE) SARS-CoV-2 target nucleic acids are NOT DETECTED. The SARS-CoV-2 RNA is generally detectable in upper respiratoy specimens during the acute phase of infection. The lowest concentration of SARS-CoV-2 viral copies this assay can detect is 131 copies/mL. A negative result does not preclude SARS-Cov-2 infection and should not be used as the sole basis for treatment or other patient management decisions. A negative result may occur with  improper specimen collection/handling, submission of specimen other than nasopharyngeal swab, presence of viral mutation(s) within the areas targeted by this assay, and inadequate number of viral copies (<131 copies/mL). A negative result must be combined with clinical observations, patient history, and epidemiological information. The expected result is Negative. Fact Sheet for Patients:  PinkCheek.be Fact Sheet for Healthcare Providers:  GravelBags.it This test is not yet ap proved or cleared by the [place] FDA and  has been authorized for detection and/or diagnosis of SARS-CoV-2 by FDA under an Emergency Use Authorization (EUA). This EUA will remain  in effect (meaning this test can be used) for the duration of the COVID-19 declaration under Section 564(b)(1) of the Act, 21 U.S.C. section 360bbb-3(b)(1), unless the authorization is terminated or revoked sooner.    Influenza A by PCR NEGATIVE NEGATIVE Final   Influenza B by PCR NEGATIVE NEGATIVE Final    Comment: (NOTE) The Xpert Xpress SARS-CoV-2/FLU/RSV assay is intended as an aid in  the diagnosis of influenza from Nasopharyngeal swab specimens and  should not be used as a sole basis for treatment. Nasal washings and  aspirates are unacceptable for Xpert Xpress SARS-CoV-2/FLU/RSV  testing. Fact Sheet for Patients: PinkCheek.be Fact Sheet for Healthcare Providers: GravelBags.it This test is not yet approved or cleared by the [place] FDA and  has been authorized for detection and/or diagnosis of SARS-CoV-2 by  FDA under an Emergency Use Authorization (EUA). This EUA will remain  in effect (meaning this test can be used) for the duration of the  Covid-19 declaration under Section 564(b)(1) of the Act, 21  U.S.C. section 360bbb-3(b)(1), unless the authorization is  terminated  or revoked. Performed at [facility], [address], [place], [place] [address]   Urine Culture     Status: Abnormal   Collection Time: 05/18/19 11:07 AM   Specimen: Urine, Clean Catch  Result Value Ref Range Status   Specimen Description   Final    URINE, CLEAN CATCH Performed at [facility], [address], [place], [place] [address]    Special Requests   Final    NONE Performed at [facility], [address] [place], [place] [address]    Culture (A)  Final    <10,000 COLONIES/mL INSIGNIFICANT GROWTH Performed at [facility] [address], [place], [place] [address]     Report Status 05/19/2019 FINAL  Final     Time coordinating discharge: 40 minutes  SIGNED:   Elmarie Shiley, MD  [facility]

## 2019-05-23 NOTE — Care Management (Signed)
Notified [facility] [place]

## 2019-05-25 ENCOUNTER — Telehealth: Payer: Self-pay | Admitting: Physician Assistant

## 2019-05-25 NOTE — Telephone Encounter (Signed)
Pt's daughter [name] called inquiring if pt ca be referred to oncology in [place]. She states that she got a call from oncology at [facility] but pt prefers [facility] if possible. Pls call [name] at [phone].

## 2019-05-25 NOTE — Telephone Encounter (Signed)
The pt daughter is going to call [facility] and have them change the appt to [facility].

## 2019-05-25 NOTE — Telephone Encounter (Signed)
Received a new pt referral from Dr. Ardis Hughs at [facility] for adenocarcinoma. Debra [name] has been cld and scheduled to see [name] and Dr. Burr Medico on 5/14 at 11am. Appt date and time has been given to the pt's daughter. Aware the pt can have only one visitor at the appt and to arrive 15 minutes early.

## 2019-05-27 ENCOUNTER — Ambulatory Visit (HOSPITAL_COMMUNITY): Payer: Medicare Other | Admitting: Hematology

## 2019-05-27 DIAGNOSIS — I700 Atherosclerosis of aorta: Secondary | ICD-10-CM | POA: Diagnosis not present

## 2019-05-27 DIAGNOSIS — R4781 Slurred speech: Secondary | ICD-10-CM | POA: Diagnosis not present

## 2019-05-27 DIAGNOSIS — R29810 Facial weakness: Secondary | ICD-10-CM | POA: Diagnosis not present

## 2019-05-27 DIAGNOSIS — K851 Biliary acute pancreatitis without necrosis or infection: Secondary | ICD-10-CM | POA: Diagnosis not present

## 2019-05-27 DIAGNOSIS — D709 Neutropenia, unspecified: Secondary | ICD-10-CM | POA: Diagnosis not present

## 2019-05-27 DIAGNOSIS — Z6820 Body mass index (BMI) 20.0-20.9, adult: Secondary | ICD-10-CM | POA: Diagnosis not present

## 2019-05-27 DIAGNOSIS — E871 Hypo-osmolality and hyponatremia: Secondary | ICD-10-CM | POA: Diagnosis not present

## 2019-05-27 DIAGNOSIS — K8031 Calculus of bile duct with cholangitis, unspecified, with obstruction: Secondary | ICD-10-CM | POA: Diagnosis not present

## 2019-05-27 DIAGNOSIS — D329 Benign neoplasm of meninges, unspecified: Secondary | ICD-10-CM | POA: Diagnosis not present

## 2019-05-27 DIAGNOSIS — E43 Unspecified severe protein-calorie malnutrition: Secondary | ICD-10-CM | POA: Diagnosis not present

## 2019-05-27 DIAGNOSIS — D631 Anemia in chronic kidney disease: Secondary | ICD-10-CM | POA: Diagnosis not present

## 2019-05-27 DIAGNOSIS — I080 Rheumatic disorders of both mitral and aortic valves: Secondary | ICD-10-CM | POA: Diagnosis not present

## 2019-05-27 DIAGNOSIS — E872 Acidosis: Secondary | ICD-10-CM | POA: Diagnosis not present

## 2019-05-27 DIAGNOSIS — I2510 Atherosclerotic heart disease of native coronary artery without angina pectoris: Secondary | ICD-10-CM | POA: Diagnosis not present

## 2019-05-27 DIAGNOSIS — M47812 Spondylosis without myelopathy or radiculopathy, cervical region: Secondary | ICD-10-CM | POA: Diagnosis not present

## 2019-05-27 DIAGNOSIS — I1310 Hypertensive heart and chronic kidney disease without heart failure, with stage 1 through stage 4 chronic kidney disease, or unspecified chronic kidney disease: Secondary | ICD-10-CM | POA: Diagnosis not present

## 2019-05-27 DIAGNOSIS — E1122 Type 2 diabetes mellitus with diabetic chronic kidney disease: Secondary | ICD-10-CM | POA: Diagnosis not present

## 2019-05-27 DIAGNOSIS — G319 Degenerative disease of nervous system, unspecified: Secondary | ICD-10-CM | POA: Diagnosis not present

## 2019-05-27 DIAGNOSIS — E785 Hyperlipidemia, unspecified: Secondary | ICD-10-CM | POA: Diagnosis not present

## 2019-05-27 DIAGNOSIS — N179 Acute kidney failure, unspecified: Secondary | ICD-10-CM | POA: Diagnosis not present

## 2019-05-27 DIAGNOSIS — M4802 Spinal stenosis, cervical region: Secondary | ICD-10-CM | POA: Diagnosis not present

## 2019-05-27 DIAGNOSIS — D630 Anemia in neoplastic disease: Secondary | ICD-10-CM | POA: Diagnosis not present

## 2019-05-27 DIAGNOSIS — N189 Chronic kidney disease, unspecified: Secondary | ICD-10-CM | POA: Diagnosis not present

## 2019-05-29 ENCOUNTER — Inpatient Hospital Stay: Payer: Medicare Other | Attending: Physician Assistant | Admitting: Physician Assistant

## 2019-05-29 ENCOUNTER — Other Ambulatory Visit: Payer: Self-pay

## 2019-05-29 ENCOUNTER — Other Ambulatory Visit: Payer: Self-pay | Admitting: Physician Assistant

## 2019-05-29 VITALS — BP 119/55 | HR 92 | Temp 98.0°F | Resp 18 | Ht 67.0 in | Wt 146.5 lb

## 2019-05-29 DIAGNOSIS — F0390 Unspecified dementia without behavioral disturbance: Secondary | ICD-10-CM | POA: Diagnosis not present

## 2019-05-29 DIAGNOSIS — G4700 Insomnia, unspecified: Secondary | ICD-10-CM | POA: Diagnosis not present

## 2019-05-29 DIAGNOSIS — R634 Abnormal weight loss: Secondary | ICD-10-CM

## 2019-05-29 DIAGNOSIS — R0609 Other forms of dyspnea: Secondary | ICD-10-CM | POA: Diagnosis not present

## 2019-05-29 DIAGNOSIS — Z993 Dependence on wheelchair: Secondary | ICD-10-CM | POA: Diagnosis not present

## 2019-05-29 DIAGNOSIS — N189 Chronic kidney disease, unspecified: Secondary | ICD-10-CM | POA: Diagnosis not present

## 2019-05-29 DIAGNOSIS — E119 Type 2 diabetes mellitus without complications: Secondary | ICD-10-CM | POA: Diagnosis not present

## 2019-05-29 DIAGNOSIS — K5900 Constipation, unspecified: Secondary | ICD-10-CM | POA: Insufficient documentation

## 2019-05-29 DIAGNOSIS — Z803 Family history of malignant neoplasm of breast: Secondary | ICD-10-CM | POA: Diagnosis not present

## 2019-05-29 DIAGNOSIS — C241 Malignant neoplasm of ampulla of Vater: Secondary | ICD-10-CM | POA: Insufficient documentation

## 2019-05-29 DIAGNOSIS — Z8042 Family history of malignant neoplasm of prostate: Secondary | ICD-10-CM | POA: Insufficient documentation

## 2019-05-29 DIAGNOSIS — Z7189 Other specified counseling: Secondary | ICD-10-CM

## 2019-05-29 DIAGNOSIS — R630 Anorexia: Secondary | ICD-10-CM

## 2019-05-29 DIAGNOSIS — I129 Hypertensive chronic kidney disease with stage 1 through stage 4 chronic kidney disease, or unspecified chronic kidney disease: Secondary | ICD-10-CM | POA: Insufficient documentation

## 2019-05-29 MED ORDER — MIRTAZAPINE 7.5 MG PO TABS: 7.5000 mg | ORAL_TABLET | Freq: Every day | ORAL | 2 refills | Status: DC

## 2019-05-29 NOTE — Progress Notes (Signed)
[place] [phone]   [phone]  CONSULT NOTE  REFERRING PHYSICIAN: Dr. 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 Dilated gallbladder with cholelithiasis and mild wall thickening. This is suspicious for cholecystitis. Recommend clinical correlation, and consider nuclear medicine hepatobiliary scan for further evaluation. 2. Diffuse biliary ductal dilatation, with common bile duct measuring 14 mm in diameter. Recommend correlation with liver function tests, and consider MRCP or ERCP for further evaluation.   05/18/2019 Imaging   Profound intra and extrahepatic biliary ductal dilatation in addition to dilatation of the pancreatic duct, slightly increased compared to prior examination dated 03/20/2019 and highly concerning for a small ampullary or pancreatic malignancy which is not directly visualized.   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 This was biopsied extensively. - The mass causes a short, abrupt distal CBD stricture which was stented with a 4cm long 50m diameter uncovered metal mesh stent.   05/20/2019 Pathology Results   SURGICAL PATHOLOGY [id number]   A. AMPULLARY, BIOPSY: - Poorly differentiated adenocarcinoma      HPI Debra BARTOKis a 86y.o. 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  The patient has some mild dementia and most of the history was obtained from the patient's daughter, CEugenio Hoes as well as per chart review.  The patient's symptoms were first noted by her primary  care physician and the patient was referred to gastroenterology and was evaluated by the clinic on 04/13/2019 for the chief complaint of cholelithiasis and biliary dilation.  A CT of the abdomen without contrast was performed on 03/20/2019 which noted dilated gallbladder with cholelithiasis.  Gastroenterology recommended further evaluation with an MRCP; however, the patient's family voiced concern about tolerability of this procedure.  Therefore, gastroenterology recommended a CT scan with contrast.  The patient was then seen in the emergency room on 05/18/2019 for stroke-like symptoms with slurred speech and facial droop for 30 minutes.  Labs performed in the emergency room demonstrated elevated bilirubin at 6.9, elevated alkaline phosphatase at 652, elevated ALT at 175, and elevated AST at 478.  She also had jaundice upon admission to the hospital.  A CT of the abdomen with contrast was performed which noted profound intra and extrahepatic biliary ductal dilatation in addition to dilatation of the pancreatic duct, slightly increased compared to prior examination dated 03/20/2019 and highly concerning for a small ampullary or pancreatic malignancy which is not directly visualized.  The patient had an ERCP performed by Dr. JArdis Hughsfrom gastroenterology on 05/20/2019.  The ERCP noted a 2-3 centimeter ampullary mass and associated common bile duct stricture.  The patient is status post stent placement.  Several biopsies were taken which the pathology ([id number]) was consistent with  ampullary adenocarcinoma.  The patient was also treated with antibiotics for cholangitis while admitted was transitioned to oral Augmentin as an outpatient.  She has 1 day left of Augmentin.   Today, the patient notes that she feels "good".  Her daughter mentions that she looks much better than when she was first admitted to the hospital.  The patient's main symptoms are related to an  80 pound weight loss over the past year, early  satiety, and decreased appetite.  The patient drinks Glucerna twice a day for supplementation.  The patient denies any fevers, night sweats, or abdominal pain.  The patient denies any chest pain, hemoptysis, or cough.  The patient has baseline dyspnea on exertion. The patient denies any more jaundice or any itching.  She denies any nausea, vomiting, or diarrhea.  The patient has baseline constipation for which she is prescribed Senokot.  The patient also notes trouble sleeping secondary to restlessness.  The patient's family history consists of a mother who had diabetes.  The patient's father has passed from reported natural causes.  The patient has a brother who has diabetes.  The patient has a nephew who had prostate cancer.  The patient also had a niece who had breast cancer.  The patient denies any other family history of any malignancies.  The patient lives at home with her 2 sons.  She had home health aides 5 days a week to help her with activities of daily living.  The patient needs assistance standing up and sitting down. She notes gradual deconditioning, particularly over this past year for which she is undergoing physical therapy twice a week.  The patient spends most of her day sitting up in a recliner.  The patient did not work and denies any occupational exposures.  She is single.  She has 8 children.  She denies any history of alcohol, tobacco, or drug use.  Marland Kitchen HPI  Past Medical History:  Diagnosis Date   CKD (chronic kidney disease)    Diabetes mellitus without complication ([place])    Gout    Hypercholesteremia    Hypertension     Past Surgical History:  Procedure Laterality Date   BILIARY STENT PLACEMENT  05/20/2019   Procedure: BILIARY STENT PLACEMENT;  Surgeon: Milus Banister, MD;  Location: [facility] ENDOSCOPY;  Service: Endoscopy;;   BIOPSY  05/20/2019   Procedure: BIOPSY;  Surgeon: Milus Banister, MD;  Location: [facility] ENDOSCOPY;  Service: Endoscopy;;   ERCP N/A 05/20/2019    Procedure: ENDOSCOPIC RETROGRADE CHOLANGIOPANCREATOGRAPHY (ERCP);  Surgeon: Milus Banister, MD;  Location[facility] ENDOSCOPY;  Service: Endoscopy;  Laterality: N/A;   TOTAL KNEE ARTHROPLASTY Left     Family History  Problem Relation Age of Onset   Diabetes Mother    Hypertension Father    Breast cancer Niece    Colon cancer Neg Hx     Social History Social History   Tobacco Use   Smoking status: Never Smoker   Smokeless tobacco: Never Used  Substance Use Topics   Alcohol use: No   Drug use: No    Allergies  Allergen Reactions   Other Nausea And Vomiting    Diet Sodas. Sugar-free jello    Current Outpatient Medications  Medication Sig Dispense Refill   amoxicillin-clavulanate (AUGMENTIN) 875-125 MG tablet Take 1 tablet by mouth 2 (two) times daily for 7 days. 14 tablet 0   aspirin EC 81 MG tablet Take 81 mg by mouth daily.     Insulin Infusion Pump (ACCU-CHEK COMBO) KIT 1 application by Does not apply route 2 (two) times daily. 1 kit 0   latanoprost (XALATAN) 0.005 % ophthalmic solution Place 1 drop into both eyes at bedtime.      metFORMIN (GLUCOPHAGE-XR) 500 MG 24 hr tablet Take 500 mg by mouth 2 (two) times daily.     mirtazapine (REMERON) 7.5 MG tablet Take 1 tablet (7.5 mg total)  by mouth at bedtime. 30 tablet 2   senna (SENOKOT) 8.6 MG TABS tablet Take 1 tablet (8.6 mg total) by mouth at bedtime as needed for mild constipation. 120 tablet 0   triamcinolone (KENALOG) 0.025 % cream Apply 1 application topically 2 (two) times daily as needed.     No current facility-administered medications for this visit.    Review of Systems  REVIEW OF SYSTEMS:   Review of Systems  Constitutional: Positive for generalized weakness, early satiety, decreased appetite, and 80 lb weight loss over 1 year.  Negative for chills and fever. HENT: Negative for mouth sores, nosebleeds, sore throat and trouble swallowing.   Eyes: Negative for eye problems and icterus.    Respiratory: Positive for baseline dyspnea on exertion. Negative for cough, hemoptysis, and wheezing.  Cardiovascular: Negative for chest pain and leg swelling.  Gastrointestinal: Positive for baseline constipation.  Negative for abdominal pain, diarrhea, nausea and vomiting.  Denies any more jaundice.  Genitourinary: Negative for bladder incontinence, difficulty urinating, dysuria, frequency and hematuria.   Musculoskeletal: Positive for being unable to stand up/sit down without assistance.  Negative for back pain, neck pain and neck stiffness.  Skin: Negative for itching and rash.  Neurological: Positive for generalized weakness.  Negative for dizziness, extremity weakness, headaches, light-headedness and seizures.  Hematological: Negative for adenopathy. Does not bruise/bleed easily.  Psychiatric/Behavioral: Negative for confusion, depression and sleep disturbance. The patient is not nervous/anxious.     PHYSICAL EXAMINATION:  Blood pressure (!) 119/55, pulse 92, temperature 98 F (36.7 C), temperature source Temporal, resp. rate 18, height _0  (1.702 m), weight 146 lb 8 oz (66.5 kg), SpO2 100 %.  ECOG PERFORMANCE STATUS: 1  Physical Exam  Constitutional: Oriented to person, city, and month. Unable to tell me exact location or date. Elderly-appearing female and in no distress.  HENT:  Head: Normocephalic and atraumatic.  Mouth/Throat: Oropharynx is clear and moist. No oropharyngeal exudate.  Eyes: Conjunctivae are normal. Right eye exhibits no discharge. Left eye exhibits no discharge. No scleral icterus.  Neck: Normal range of motion. Neck supple.  Cardiovascular: Normal rate, regular rhythm, normal heart sounds and intact distal pulses.   Pulmonary/Chest: Effort normal and breath sounds normal. No respiratory distress. No wheezes. No rales.  Abdominal: Soft. Bowel sounds are normal. Exhibits no distension and no mass. There is no tenderness.  No rebound tenderness.  Negative Murphy  sign. Musculoskeletal: Normal range of motion.  Mild bilateral lower extremity pitting edema. Lymphadenopathy:    No cervical adenopathy.  Neurological: Alert and oriented to person, city, and month. Exhibits muscle wasting.  Patient examined in the wheelchair Skin: Skin is warm and dry. No rash noted. Not diaphoretic. No erythema. No pallor.  Psychiatric: Mood, memory and judgment normal.  Vitals reviewed.  LABORATORY DATA: Lab Results  Component Value Date   WBC 9.4 05/22/2019   HGB 8.2 (L) 05/22/2019   HCT 24.5 (L) 05/22/2019   MCV 92.5 05/22/2019   PLT 456 (H) 05/22/2019      Chemistry      Component Value Date/Time   NA 133 (L) 05/22/2019 0347   K 3.9 05/22/2019 0347   CL 104 05/22/2019 0347   CO2 21 (L) 05/22/2019 0347   BUN 15 05/22/2019 0347   CREATININE 1.52 (H) 05/22/2019 0347      Component Value Date/Time   CALCIUM 7.6 (L) 05/22/2019 0347   ALKPHOS 448 (H) 05/22/2019 0818   AST 172 (H) 05/22/2019 0818   ALT 112 (H)  05/22/2019 0818   BILITOT 2.9 (H) 05/22/2019 0818       RADIOGRAPHIC STUDIES: CT Head Wo Contrast  Result Date: 05/18/2019 CLINICAL DATA:  Ataxia with stroke suspected EXAM: CT HEAD WITHOUT CONTRAST TECHNIQUE: Contiguous axial images were obtained from the base of the skull through the vertex without intravenous contrast. COMPARISON:  02/27/2019 FINDINGS: Brain: No evidence of acute infarction, hemorrhage, hydrocephalus, extra-axial collection or mass lesion/mass effect. Chronic small vessel ischemia in the periventricular white matter. Cerebral volume loss with ventriculomegaly Vascular: No hyperdense vessel or unexpected calcification. Skull: Normal. Negative for fracture or focal lesion. Sinuses/Orbits: No acute finding. IMPRESSION: 1. No acute finding. 2. Atrophy and chronic small vessel ischemia. Electronically Signed   By: Monte Fantasia M.D.   On: 05/18/2019 10:37   MR ANGIO HEAD WO CONTRAST  Result Date: 05/19/2019 CLINICAL DATA:  Initial  evaluation for acute slurred speech, left-sided facial droop. EXAM: MR HEAD WITHOUT CONTRAST MR CIRCLE OF WILLIS WITHOUT CONTRAST MRA OF THE NECK WITHOUT AND WITH CONTRAST TECHNIQUE: Multiplanar, multiecho pulse sequences of the brain, circle of Willis and surrounding structures were obtained without intravenous contrast. Angiographic images of the neck were obtained using MRA technique without and with intravenous contrast. CONTRAST:  6.5 cc of Gadavist. COMPARISON:  Prior head CT from 05/18/2019. FINDINGS: MRI HEAD FINDINGS Brain: Diffuse prominence of the CSF containing spaces compatible with generalized age-related cerebral atrophy. Patchy and confluent T2/FLAIR hyperintensity within the periventricular and deep white matter both cerebral hemispheres most consistent with chronic small vessel ischemic disease, mild for age. No abnormal foci of restricted diffusion to suggest acute or subacute ischemia. Gray-white matter differentiation maintained. No encephalomalacia to suggest chronic cortical infarction. No evidence for acute or chronic intracranial hemorrhage. 8 mm meningioma overlies the left frontal convexity without associated mass effect (series 6, image 15). No other mass lesion, mass effect, or midline shift. Diffuse ventricular prominence related to global parenchymal volume loss without hydrocephalus. No extra-axial fluid collection. Note made of a partially empty sella. Midline structures intact. Vascular: Major intracranial vascular flow voids are maintained. Skull and upper cervical spine: Craniocervical junction within normal limits. Multilevel degenerative spondylolysis noted within the upper cervical spine with resultant moderate spinal stenosis at the level of C3-4. Bone marrow signal intensity within normal limits. No focal marrow replacing lesion. No scalp soft tissue abnormality. Sinuses/Orbits: Patient status post bilateral ocular lens replacement. Small amount of pneumatized secretions  noted within the left sphenoid sinus. Paranasal sinuses are otherwise largely clear. No significant mastoid effusion. Inner ear structures grossly normal. Other: None. MRA HEAD FINDINGS ANTERIOR CIRCULATION: Visualized distal cervical segments of the internal carotid arteries are patent with symmetric antegrade flow. Petrous, cavernous, and supraclinoid ICAs patent without flow-limiting stenosis. A1 segments widely patent. Normal anterior communicating artery complex. Anterior cerebral arteries patent to their distal aspects without stenosis. No M1 stenosis or occlusion. Normal MCA bifurcations. Distal MCA branches well perfused and symmetric. POSTERIOR CIRCULATION: Partially visualized vertebral arteries widely patent. Right vertebral artery dominant. Both picas patent. Basilar patent to its distal aspect without stenosis. Superior cerebral arteries patent bilaterally. Right PCA supplied via the basilar. Predominant fetal type origin of the left PCA. Both PCAs well perfused to their distal aspects. No intracranial aneurysm. MRA NECK FINDINGS: AORTIC ARCH: Examination degraded by motion artifact. Visualized aortic arch of normal caliber with normal 3 vessel morphology. No flow-limiting stenosis about the origin of the great vessels. Visualized subclavian arteries widely patent. RIGHT CAROTID SYSTEM: Right CCA tortuous but widely patent to  the bifurcation without stenosis. Atheromatous irregularity about the right carotid bifurcation with associated stenosis of up to approximate 50% by NASCET criteria. Right ICA patent distally to the skull base without stenosis or occlusion. LEFT CAROTID SYSTEM: Left CCA patent from its origin to the bifurcation without stenosis. Atheromatous irregularity about the left carotid bifurcation with associated stenosis of up to approximately 50% by NASCET criteria. Left ICA otherwise patent distally to the skull base without stenosis or occlusion. VERTEBRAL ARTERIES: Both vertebral  arteries arise from the subclavian arteries. Right vertebral artery dominant. Vertebral arteries not well assessed proximally due to motion. Visualized portions are mildly tortuous but widely patent without stenosis or occlusion. IMPRESSION: MRI HEAD IMPRESSION: 1. No acute intracranial infarct or other abnormality. 2. Generalized age advanced cerebral atrophy with mild chronic small vessel ischemic disease. 3. 8 mm meningioma overlying the left frontal convexity without associated mass effect. MRA HEAD IMPRESSION: Negative intracranial MRA with no large vessel occlusion. No hemodynamically significant or correctable stenosis. MRA NECK IMPRESSION: 1. Atheromatous change about the carotid bifurcations with estimated stenoses of up to approximately 50% by NASCET criteria bilaterally. 2. Patent vertebral arteries within the neck. Right vertebral artery dominant. Electronically Signed   By: Jeannine Boga M.D.   On: 05/19/2019 20:56   MR ANGIO NECK W WO CONTRAST  Result Date: 05/19/2019 CLINICAL DATA:  Initial evaluation for acute slurred speech, left-sided facial droop. EXAM: MR HEAD WITHOUT CONTRAST MR CIRCLE OF WILLIS WITHOUT CONTRAST MRA OF THE NECK WITHOUT AND WITH CONTRAST TECHNIQUE: Multiplanar, multiecho pulse sequences of the brain, circle of Willis and surrounding structures were obtained without intravenous contrast. Angiographic images of the neck were obtained using MRA technique without and with intravenous contrast. CONTRAST:  6.5 cc of Gadavist. COMPARISON:  Prior head CT from 05/18/2019. FINDINGS: MRI HEAD FINDINGS Brain: Diffuse prominence of the CSF containing spaces compatible with generalized age-related cerebral atrophy. Patchy and confluent T2/FLAIR hyperintensity within the periventricular and deep white matter both cerebral hemispheres most consistent with chronic small vessel ischemic disease, mild for age. No abnormal foci of restricted diffusion to suggest acute or subacute ischemia.  Gray-white matter differentiation maintained. No encephalomalacia to suggest chronic cortical infarction. No evidence for acute or chronic intracranial hemorrhage. 8 mm meningioma overlies the left frontal convexity without associated mass effect (series 6, image 15). No other mass lesion, mass effect, or midline shift. Diffuse ventricular prominence related to global parenchymal volume loss without hydrocephalus. No extra-axial fluid collection. Note made of a partially empty sella. Midline structures intact. Vascular: Major intracranial vascular flow voids are maintained. Skull and upper cervical spine: Craniocervical junction within normal limits. Multilevel degenerative spondylolysis noted within the upper cervical spine with resultant moderate spinal stenosis at the level of C3-4. Bone marrow signal intensity within normal limits. No focal marrow replacing lesion. No scalp soft tissue abnormality. Sinuses/Orbits: Patient status post bilateral ocular lens replacement. Small amount of pneumatized secretions noted within the left sphenoid sinus. Paranasal sinuses are otherwise largely clear. No significant mastoid effusion. Inner ear structures grossly normal. Other: None. MRA HEAD FINDINGS ANTERIOR CIRCULATION: Visualized distal cervical segments of the internal carotid arteries are patent with symmetric antegrade flow. Petrous, cavernous, and supraclinoid ICAs patent without flow-limiting stenosis. A1 segments widely patent. Normal anterior communicating artery complex. Anterior cerebral arteries patent to their distal aspects without stenosis. No M1 stenosis or occlusion. Normal MCA bifurcations. Distal MCA branches well perfused and symmetric. POSTERIOR CIRCULATION: Partially visualized vertebral arteries widely patent. Right vertebral artery dominant. Both picas  patent. Basilar patent to its distal aspect without stenosis. Superior cerebral arteries patent bilaterally. Right PCA supplied via the basilar.  Predominant fetal type origin of the left PCA. Both PCAs well perfused to their distal aspects. No intracranial aneurysm. MRA NECK FINDINGS: AORTIC ARCH: Examination degraded by motion artifact. Visualized aortic arch of normal caliber with normal 3 vessel morphology. No flow-limiting stenosis about the origin of the great vessels. Visualized subclavian arteries widely patent. RIGHT CAROTID SYSTEM: Right CCA tortuous but widely patent to the bifurcation without stenosis. Atheromatous irregularity about the right carotid bifurcation with associated stenosis of up to approximate 50% by NASCET criteria. Right ICA patent distally to the skull base without stenosis or occlusion. LEFT CAROTID SYSTEM: Left CCA patent from its origin to the bifurcation without stenosis. Atheromatous irregularity about the left carotid bifurcation with associated stenosis of up to approximately 50% by NASCET criteria. Left ICA otherwise patent distally to the skull base without stenosis or occlusion. VERTEBRAL ARTERIES: Both vertebral arteries arise from the subclavian arteries. Right vertebral artery dominant. Vertebral arteries not well assessed proximally due to motion. Visualized portions are mildly tortuous but widely patent without stenosis or occlusion. IMPRESSION: MRI HEAD IMPRESSION: 1. No acute intracranial infarct or other abnormality. 2. Generalized age advanced cerebral atrophy with mild chronic small vessel ischemic disease. 3. 8 mm meningioma overlying the left frontal convexity without associated mass effect. MRA HEAD IMPRESSION: Negative intracranial MRA with no large vessel occlusion. No hemodynamically significant or correctable stenosis. MRA NECK IMPRESSION: 1. Atheromatous change about the carotid bifurcations with estimated stenoses of up to approximately 50% by NASCET criteria bilaterally. 2. Patent vertebral arteries within the neck. Right vertebral artery dominant. Electronically Signed   By: Jeannine Boga  M.D.   On: 05/19/2019 20:56   MR BRAIN WO CONTRAST  Result Date: 05/19/2019 CLINICAL DATA:  Initial evaluation for acute slurred speech, left-sided facial droop. EXAM: MR HEAD WITHOUT CONTRAST MR CIRCLE OF WILLIS WITHOUT CONTRAST MRA OF THE NECK WITHOUT AND WITH CONTRAST TECHNIQUE: Multiplanar, multiecho pulse sequences of the brain, circle of Willis and surrounding structures were obtained without intravenous contrast. Angiographic images of the neck were obtained using MRA technique without and with intravenous contrast. CONTRAST:  6.5 cc of Gadavist. COMPARISON:  Prior head CT from 05/18/2019. FINDINGS: MRI HEAD FINDINGS Brain: Diffuse prominence of the CSF containing spaces compatible with generalized age-related cerebral atrophy. Patchy and confluent T2/FLAIR hyperintensity within the periventricular and deep white matter both cerebral hemispheres most consistent with chronic small vessel ischemic disease, mild for age. No abnormal foci of restricted diffusion to suggest acute or subacute ischemia. Gray-white matter differentiation maintained. No encephalomalacia to suggest chronic cortical infarction. No evidence for acute or chronic intracranial hemorrhage. 8 mm meningioma overlies the left frontal convexity without associated mass effect (series 6, image 15). No other mass lesion, mass effect, or midline shift. Diffuse ventricular prominence related to global parenchymal volume loss without hydrocephalus. No extra-axial fluid collection. Note made of a partially empty sella. Midline structures intact. Vascular: Major intracranial vascular flow voids are maintained. Skull and upper cervical spine: Craniocervical junction within normal limits. Multilevel degenerative spondylolysis noted within the upper cervical spine with resultant moderate spinal stenosis at the level of C3-4. Bone marrow signal intensity within normal limits. No focal marrow replacing lesion. No scalp soft tissue abnormality.  Sinuses/Orbits: Patient status post bilateral ocular lens replacement. Small amount of pneumatized secretions noted within the left sphenoid sinus. Paranasal sinuses are otherwise largely clear. No significant mastoid effusion.  Inner ear structures grossly normal. Other: None. MRA HEAD FINDINGS ANTERIOR CIRCULATION: Visualized distal cervical segments of the internal carotid arteries are patent with symmetric antegrade flow. Petrous, cavernous, and supraclinoid ICAs patent without flow-limiting stenosis. A1 segments widely patent. Normal anterior communicating artery complex. Anterior cerebral arteries patent to their distal aspects without stenosis. No M1 stenosis or occlusion. Normal MCA bifurcations. Distal MCA branches well perfused and symmetric. POSTERIOR CIRCULATION: Partially visualized vertebral arteries widely patent. Right vertebral artery dominant. Both picas patent. Basilar patent to its distal aspect without stenosis. Superior cerebral arteries patent bilaterally. Right PCA supplied via the basilar. Predominant fetal type origin of the left PCA. Both PCAs well perfused to their distal aspects. No intracranial aneurysm. MRA NECK FINDINGS: AORTIC ARCH: Examination degraded by motion artifact. Visualized aortic arch of normal caliber with normal 3 vessel morphology. No flow-limiting stenosis about the origin of the great vessels. Visualized subclavian arteries widely patent. RIGHT CAROTID SYSTEM: Right CCA tortuous but widely patent to the bifurcation without stenosis. Atheromatous irregularity about the right carotid bifurcation with associated stenosis of up to approximate 50% by NASCET criteria. Right ICA patent distally to the skull base without stenosis or occlusion. LEFT CAROTID SYSTEM: Left CCA patent from its origin to the bifurcation without stenosis. Atheromatous irregularity about the left carotid bifurcation with associated stenosis of up to approximately 50% by NASCET criteria. Left ICA  otherwise patent distally to the skull base without stenosis or occlusion. VERTEBRAL ARTERIES: Both vertebral arteries arise from the subclavian arteries. Right vertebral artery dominant. Vertebral arteries not well assessed proximally due to motion. Visualized portions are mildly tortuous but widely patent without stenosis or occlusion. IMPRESSION: MRI HEAD IMPRESSION: 1. No acute intracranial infarct or other abnormality. 2. Generalized age advanced cerebral atrophy with mild chronic small vessel ischemic disease. 3. 8 mm meningioma overlying the left frontal convexity without associated mass effect. MRA HEAD IMPRESSION: Negative intracranial MRA with no large vessel occlusion. No hemodynamically significant or correctable stenosis. MRA NECK IMPRESSION: 1. Atheromatous change about the carotid bifurcations with estimated stenoses of up to approximately 50% by NASCET criteria bilaterally. 2. Patent vertebral arteries within the neck. Right vertebral artery dominant. Electronically Signed   By: Jeannine Boga M.D.   On: 05/19/2019 20:56   CT ABDOMEN PELVIS W CONTRAST  Result Date: 05/18/2019 CLINICAL DATA:  Abdominal pain, neutropenia, elevated LFTs EXAM: CT ABDOMEN AND PELVIS WITH CONTRAST TECHNIQUE: Multidetector CT imaging of the abdomen and pelvis was performed using the standard protocol following bolus administration of intravenous contrast. CONTRAST:  25m OMNIPAQUE IOHEXOL 300 MG/ML  SOLN COMPARISON:  03/20/2019 FINDINGS: Lower chest: No acute abnormality. Three-vessel coronary artery calcifications. Hepatobiliary: Redemonstrated profound intra and extrahepatic biliary ductal dilatation to the ampulla, which is increased compared prior examination, the common bile duct measuring up to 1.6 cm. The gallbladder is distended and contains numerous tiny gallstones. Pancreas: The pancreatic duct is profoundly dilated along its length to the ampulla, measuring up to 1.4 cm centrally. Spleen: Normal in size  without significant abnormality. Adrenals/Urinary Tract: Adrenal glands are unremarkable. Kidneys are normal, without renal calculi, solid lesion, or hydronephrosis. Bladder is unremarkable. Stomach/Bowel: Stomach is within normal limits. Appendix appears normal. No evidence of bowel wall thickening, distention, or inflammatory changes. Vascular/Lymphatic: Aortic atherosclerosis. No enlarged abdominal or pelvic lymph nodes. Reproductive: No mass or other significant abnormality. Other: No abdominal wall hernia or abnormality. No abdominopelvic ascites. Musculoskeletal: No acute or significant osseous findings. IMPRESSION: 1. Profound intra and extrahepatic biliary ductal dilatation in  addition to dilatation of the pancreatic duct, slightly increased compared to prior examination dated 03/20/2019 and highly concerning for a small ampullary or pancreatic malignancy which is not directly visualized. 2.  Cholelithiasis. 3.  Coronary artery disease.  Aortic Atherosclerosis (ICD10-I70.0). Electronically Signed   By: Eddie Candle M.D.   On: 05/18/2019 11:15   DG Chest Port 1 View  Result Date: 05/18/2019 CLINICAL DATA:  Weakness and slurred speech. EXAM: PORTABLE CHEST 1 VIEW COMPARISON:  01/28/2017. FINDINGS: Trachea is midline. Heart size normal. Thoracic aorta is calcified. Lungs are clear. No pleural fluid. IMPRESSION: 1. No acute findings. 2.  Aortic atherosclerosis (ICD10-I70.0). Electronically Signed   By: Lorin Picket M.D.   On: 05/18/2019 10:26   DG ERCP BILIARY & PANCREATIC DUCTS  Result Date: 05/20/2019 CLINICAL DATA:  Biliary and pancreatic ductal dilatation. EXAM: ERCP TECHNIQUE: Multiple spot images obtained with the fluoroscopic device and submitted for interpretation post-procedure. COMPARISON:  CT of the abdomen and pelvis on 05/18/2019 FINDINGS: Imaging obtained with a C-arm during the endoscopic procedure demonstrates cannulation of the common bile duct with contrast injection demonstrating  diffuse and massive dilatation of the common bile duct as well as visualized intrahepatic ducts. A self expanding metallic biliary stent was placed in the distal CBD. IMPRESSION: Biliary obstruction treated with placement of a self expanding metallic stent in the distal CBD. These images were submitted for radiologic interpretation only. Please see the procedural report for the amount of contrast and the fluoroscopy time utilized. Electronically Signed   By: Aletta Edouard M.D.   On: 05/20/2019 16:13   ECHOCARDIOGRAM COMPLETE  Result Date: 05/19/2019    ECHOCARDIOGRAM REPORT   Patient Name:   Debra [name] Date of Exam: 05/19/2019 Medical Rec #:  [id number]          Height:       67.0 in Accession #:    [id number]         Weight:       145.0 lb Date of Birth:  January 28, 1930          BSA:          1.764 m Patient Age:    78 years           BP:           103/58 mmHg Patient Gender: F                  HR:           92 bpm. Exam Location:  Inpatient Procedure: 2D Echo Indications:    stroke like symptom  History:        Patient has no prior history of Echocardiogram examinations.                 Risk Factors:Diabetes, Dyslipidemia and Hypertension.  Sonographer:    Johny Chess Referring Phys: [id number] TAYE T GONFA IMPRESSIONS  1. Left ventricular ejection fraction, by estimation, is 60 to 65%. The left ventricle has normal function. The left ventricle has no regional wall motion abnormalities. Left ventricular diastolic parameters were normal.  2. Right ventricular systolic function is normal. The right ventricular size is normal.  3. The mitral valve is normal in structure. Trivial mitral valve regurgitation. No evidence of mitral stenosis.  4. The aortic valve is tricuspid. Aortic valve regurgitation is not visualized. Mild to moderate aortic valve sclerosis/calcification is present, without any evidence of aortic stenosis.  5. The inferior vena cava is normal in size  with greater than 50% respiratory  variability, suggesting right atrial pressure of 3 mmHg. Comparison(s): No prior Echocardiogram. FINDINGS  Left Ventricle: Left ventricular ejection fraction, by estimation, is 60 to 65%. The left ventricle has normal function. The left ventricle has no regional wall motion abnormalities. The left ventricular internal cavity size was normal in size. There is  no left ventricular hypertrophy. Left ventricular diastolic parameters were normal. Right Ventricle: The right ventricular size is normal. No increase in right ventricular wall thickness. Right ventricular systolic function is normal. Left Atrium: Left atrial size was normal in size. Right Atrium: Right atrial size was normal in size. Pericardium: There is no evidence of pericardial effusion. Mitral Valve: The mitral valve is normal in structure. Trivial mitral valve regurgitation. No evidence of mitral valve stenosis. Tricuspid Valve: The tricuspid valve is normal in structure. Tricuspid valve regurgitation is not demonstrated. No evidence of tricuspid stenosis. Aortic Valve: The aortic valve is tricuspid. . There is moderate thickening and moderate calcification of the aortic valve. Aortic valve regurgitation is not visualized. Mild to moderate aortic valve sclerosis/calcification is present, without any evidence of aortic stenosis. Moderate aortic valve annular calcification. There is moderate thickening of the aortic valve. There is moderate calcification of the aortic valve. Pulmonic Valve: The pulmonic valve was not well visualized. Pulmonic valve regurgitation is not visualized. No evidence of pulmonic stenosis. Aorta: The aortic root was not well visualized and the ascending aorta was not well visualized. Venous: The inferior vena cava is normal in size with greater than 50% respiratory variability, suggesting right atrial pressure of 3 mmHg. IAS/Shunts: No atrial level shunt detected by color flow Doppler.  LEFT VENTRICLE PLAX 2D LVIDd:         3.00 cm   Diastology LVIDs:         2.05 cm  LV e' lateral: 11.40 cm/s LV PW:         0.80 cm  LV e' medial:  6.96 cm/s LV IVS:        0.90 cm LVOT diam:     1.70 cm LV SV:         42 LV SV Index:   24 LVOT Area:     2.27 cm  RIGHT VENTRICLE RV S prime:     12.80 cm/s TAPSE (M-mode): 1.5 cm LEFT ATRIUM             Index       RIGHT ATRIUM          Index LA diam:        2.70 cm 1.53 cm/m  RA Area:     8.60 cm LA Vol (A2C):   38.2 ml 21.66 ml/m RA Volume:   15.10 ml 8.56 ml/m LA Vol (A4C):   32.7 ml 18.54 ml/m LA Biplane Vol: 35.4 ml 20.07 ml/m  AORTIC VALVE LVOT Vmax:   104.00 cm/s LVOT Vmean:  69.400 cm/s LVOT VTI:    0.183 m  AORTA Ao Root diam: 2.90 cm  SHUNTS Systemic VTI:  0.18 m Systemic Diam: 1.70 cm Buford Dresser MD Electronically signed by Buford Dresser MD Signature Date/Time: 05/19/2019/8:44:09 PM    Final    VAS [place] LOWER EXTREMITY VENOUS (DVT)  Result Date: 05/21/2019  Lower Venous DVTStudy Other Indications: TIA/ new diagnosis cancer. Comparison Study: none Performing Technologist: June Leap RDMS, RVT  Examination Guidelines: A complete evaluation includes B-mode imaging, spectral Doppler, color Doppler, and power Doppler as needed of all accessible portions of each vessel.  Bilateral testing is considered an integral part of a complete examination. Limited examinations for reoccurring indications may be performed as noted. The reflux portion of the exam is performed with the patient in reverse Trendelenburg.  +---------+---------------+---------+-----------+----------+--------------+  RIGHT     Compressibility Phasicity Spontaneity Properties Thrombus Aging  +---------+---------------+---------+-----------+----------+--------------+  CFV       Full            Yes       Yes                                    +---------+---------------+---------+-----------+----------+--------------+  SFJ       Full                                                              +---------+---------------+---------+-----------+----------+--------------+  FV Prox   Full                                                             +---------+---------------+---------+-----------+----------+--------------+  FV Mid    Full                                                             +---------+---------------+---------+-----------+----------+--------------+  FV Distal Full                                                             +---------+---------------+---------+-----------+----------+--------------+  PFV       Full                                                             +---------+---------------+---------+-----------+----------+--------------+  POP       Full            Yes       Yes                                    +---------+---------------+---------+-----------+----------+--------------+  PTV       Full                                                             +---------+---------------+---------+-----------+----------+--------------+  PERO      Full                                                             +---------+---------------+---------+-----------+----------+--------------+   +---------+---------------+---------+-----------+----------+--------------+  LEFT      Compressibility Phasicity Spontaneity Properties Thrombus Aging  +---------+---------------+---------+-----------+----------+--------------+  CFV       Full            Yes       Yes                                    +---------+---------------+---------+-----------+----------+--------------+  SFJ       Full                                                             +---------+---------------+---------+-----------+----------+--------------+  FV Prox   Full                                                             +---------+---------------+---------+-----------+----------+--------------+  FV Mid    Full                                                              +---------+---------------+---------+-----------+----------+--------------+  FV Distal Full                                                             +---------+---------------+---------+-----------+----------+--------------+  PFV       Full                                                             +---------+---------------+---------+-----------+----------+--------------+  POP       Full            Yes       Yes                                    +---------+---------------+---------+-----------+----------+--------------+  PTV       Full                                                             +---------+---------------+---------+-----------+----------+--------------+  PERO      Full                                                             +---------+---------------+---------+-----------+----------+--------------+  Summary: RIGHT: - There is no evidence of deep vein thrombosis in the lower extremity.  - No cystic structure found in the popliteal fossa.  LEFT: - There is no evidence of deep vein thrombosis in the lower extremity.  - No cystic structure found in the popliteal fossa.  *See table(s) above for measurements and observations. Electronically signed by Servando Snare MD on 05/21/2019 at 5:26:54 PM.    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  She was diagnosed in May 2021.   1.  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  Pending further staging work-up. -Patient diagnosed in May 2021 with painless obstructive jaundice, biliary dilation, and elevated LFTs. -CT scan of the abdomen and pelvis with contrast performed on 05/18/2019 noted profound intra and extrahepatic biliary ductal dilatation in addition to dilatation of the pancreatic duct, slightly increased compared to prior examination dated 03/20/2019 and highly concerning for a small ampullary or pancreatic  malignancy which is not directly visualized. -ERCP performed by Dr. Ardis Hughs on 05/20/2019 noted ulcerated 2-3cm ampullary mass. The mass causes a short, abrupt distal CBD stricture which was stented with a 4cm long 59m diameter uncovered metal mesh stent. -Pathology ([id number]) was consistent with ampullary adenocarcinoma.  -CEA 7.5 05/19/19 -CA 19.9 <2 05/19/19 -The patient's disease appears to be localized; however, I will arrange for the patient to have a CT scan of the chest without contrast (due to her CKD) to complete the staging process. -The patient's case will be discussed at the GI multidisciplinary conference next week on 06/03/19 -The patient was seen with Dr. FBurr Medicotoday.  Dr. FBurr Medicohad a lengthy discussion with the patient about her current condition and treatment options.  Dr. FBurr Medicodiscussed that although surgery is the curative approach, it carries significant surgical complications, and likely prolonged recovery which would be very difficult given her age, physical condition, and comorbidities. -Dr. FBurr Medicodiscussed chemotherapy would likely be challenging for her given her age and comorbidities, but would be an option to discuss if no other options were available.  -We will request MMR testing and foundation 1 testing to assess if the patient is a candidate for immunotherapy or targeted therapy in the future.  -Dr. FBurr Medicoencouraged the patient to consider radiation.  She discussed that this may provide some disease control, but would not be curative.  Dr. FBurr Medicodiscussed the benefit and potential side effects. The patient is interested in referral to radiation oncology which has been placed today.  -Dr. FBurr Medicowill see the patient back for follow-up visit in 1 month for evaluation.  The patient will likely have completed radiotherapy at that time.  2. Weight loss/insomnia/deconditioning -Patient has reportedly lost 80 pounds over this past year. -Patient endorsing early satiety, decreased  appetite, and weight loss.  The patient is also reporting insomnia/restlessness. -Patient recently started nutritional supplementation with Glucerna. Encouraged to continue. -Dr. FBurr Medicorecommends low-dose Remeron 7.5 mg p.o. daily at bedtime.  The patient is interested in this and I have sent a prescription to the patient's pharmacy for pickup -The patient is unable to stand up or sit down without assistance.  She spends most of the day in the recliner.  She lives at home with her 2 sons and has home health aide 5 days a week.  She is currently undergoing physical therapy due to her deconditioning.  Recommend that she continue physical therapy.  3. CKD -probably  secondary to DM -stable -Will order CT of chest without contrast  Plan: -CT scan of the chest without contrast to complete the staging work-up -Requested MMR testing as well as foundation 1 testing today 5/14 to assess if the patient is a candidate for immunotherapy or targeted therapy in the future. -Referral to radiation oncology Dr. Lisbeth Renshaw -Prescription for Remeron 7.5 mg p.o. daily at bedtime for insomnia and decreased appetite/weight loss -Follow up visit with Dr. Burr Medico in 1 month   Exavior Kimmons L Toshi Ishii May 29, 2019, 5:04 PM   Addendum  I have seen the patient, examined her. I agree with the assessment and and plan and have edited the notes.   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 I reviewed her imaging, endoscopy, and the biopsy pathology results with the patient and her daughter in details.  She has a biopsy proved ampullary adenocarcinoma, status post ERCP for CBD stent placement for jaundice. CT AP with contrast showed no node for distant metastasis.  We will get a CT chest to complete staging.  We discussed the standard, and only curative treatment is Whipple surgery.  Given  her advanced age, multiple comorbidities, and poor performance status, she is unlikely a candidate for such surgery.  We discussed the other alternative treatment, including radiation and systemic therapy.  She is a very poor candidate for chemotherapy which I would not recommend.  I will check MSI and Foundation One to see if she is a candidate for immunotherapy or targeted therapy. Pt and her daughter agrees.   I will present her case in GI tumor conference next week, then likely will refer her to radiation oncology Dr. Lisbeth Renshaw to consider radiation therapy.  Will see her back in month for follow up.  Truitt Merle  05/29/2019

## 2019-05-29 NOTE — Patient Instructions (Addendum)
-  The sample (biopsy) they took of the tumor that they took is consistent with Ampullary Adenocarcinoma  -We will have a multidisciplinary  conference with various doctors (surgeons, medical oncologist, radiation doctors, and GI doctors) who will put their heads together to determine what they believe the best option is for you. However, Debra [name] believes that the best option for you would be to have you see Debra [name] who is a radiation doctor to discuss radiation treatment with you.  -We are sending your sample from your biopsy for special testing to see if you would be a good candidate for other treatments of needed in the future.  -We still need to get a CT of the chest to make sure nothing spread to that region. The radiology department should call you to schedule this. However, if you need to reach them, their number is [phone] -I sent a prescription for remeron to your pharmacy. Please take a night. This helps with sleep as well as will boost your appetite.

## 2019-06-01 DIAGNOSIS — K8309 Other cholangitis: Secondary | ICD-10-CM | POA: Diagnosis not present

## 2019-06-01 DIAGNOSIS — C259 Malignant neoplasm of pancreas, unspecified: Secondary | ICD-10-CM | POA: Diagnosis not present

## 2019-06-01 DIAGNOSIS — I10 Essential (primary) hypertension: Secondary | ICD-10-CM | POA: Diagnosis not present

## 2019-06-01 DIAGNOSIS — Z6822 Body mass index (BMI) 22.0-22.9, adult: Secondary | ICD-10-CM | POA: Diagnosis not present

## 2019-06-01 NOTE — Progress Notes (Signed)
GI Location of Tumor / Histology: Ampullary Carcinoma  Debra [name] presented with stroke like symptoms, slurred speech, left facial droop, limp, eye color changed (jaundiced).  These symptoms would come and go, when they reappeared the symptoms were milder.  ERCP 05/20/2019: Ulcerated 2-3 cm ampullary mass that was suspicious for neoplasm, likely ampullary carcinoma.  The mass causes a short, abrupt distal CBD stricture which was stented with a 4 cm long 45m diameter uncovered metal mesh stent.  CT abd 05/18/2019: Profound intra and extrahepatic biliary ductal dilatation in addition to dilatation of the pancreatic duct, slightly increased compared to prior examination dated 03/20/2019 and highly concerning for a small ampullary or pancreatic malignancy which is not directly visualized.   CT abd 03/20/2019: 1. Dilated gallbladder with cholelithiasis and mild wall thickening. This is suspicious for cholecystitis. Recommend clinical correlation, and consider nuclear medicine hepatobiliary scan for further evaluation. 2. Diffuse biliary ductal dilatation, with common bile duct measuring 14 mm in diameter. Recommend correlation with liver function tests, and consider MRCP or ERCP for further evaluation.   Biopsies of Ampullary 05/20/2019    Past/Anticipated interventions by surgeon, if any: -Not a surgical candidate   Past/Anticipated interventions by medical oncology, if any:  [facility] Heilingoetter/ Dr. FBurr Medico5/14/2021 -The patient's disease appears to be localized; however, I will arrange for the patient to have a CT scan of the chest without contrast (due to her CKD) to complete the staging process. -The patient's case will be discussed at the GI multidisciplinary conference next week on 06/03/19 -The patient was seen with Dr. FBurr Medicotoday.  Dr. FBurr Medicohad a lengthy discussion with the patient about her current condition and treatment options.  Dr. FBurr Medicodiscussed that although surgery is the curative  approach, it carries significant surgical complications, and likely prolonged recovery which would be very difficult given her age, physical condition, and comorbidities. -Dr. FBurr Medicodiscussed chemotherapy would likely be challenging for her given her age and comorbidities, but would be an option to discuss if no other options were available.  -We will request MMR testing and foundation 1 testing to assess if the patient is a candidate for immunotherapy or targeted therapy in the future.  -Dr. FBurr Medicoencouraged the patient to consider radiation.  She discussed that this may provide some disease control, but would not be curative.  Dr. FBurr Medicodiscussed the benefit and potential side effects. The patient is interested in referral to radiation oncology which has been placed today.    Weight changes, if any: 80 pounds lost over the past year.  Appetite is poor.  Daughter states she has gained 2 pounds since discharge from the hospital.  Bowel/Bladder complaints, if any: Has some constipation in relation to metformin.  Nausea / Vomiting, if any: No  Pain issues, if any:  No     SAFETY ISSUES:  Prior radiation? No  Pacemaker/ICD? No  Possible current pregnancy? Post-menopausal  Is the patient on methotrexate? No  Current Complaints/Details: -She sees PT twice a week for deconditioning, she needs assistance standing up and sitting down.  She spends most of her day sitting in recliner.  PT started 06/02/2019.

## 2019-06-02 ENCOUNTER — Telehealth: Payer: Self-pay | Admitting: Radiation Oncology

## 2019-06-02 ENCOUNTER — Ambulatory Visit
Admission: RE | Admit: 2019-06-02 | Discharge: 2019-06-02 | Disposition: A | Discharge status: Home / Self Care | Payer: Medicare Other | Source: Ambulatory Visit | Attending: Radiation Oncology | Admitting: Radiation Oncology

## 2019-06-02 ENCOUNTER — Other Ambulatory Visit: Payer: Self-pay

## 2019-06-02 ENCOUNTER — Other Ambulatory Visit: Payer: Self-pay | Admitting: Radiation Oncology

## 2019-06-02 ENCOUNTER — Encounter: Payer: Self-pay | Admitting: Radiation Oncology

## 2019-06-02 VITALS — Ht 67.0 in | Wt 146.0 lb

## 2019-06-02 DIAGNOSIS — D631 Anemia in chronic kidney disease: Secondary | ICD-10-CM | POA: Diagnosis not present

## 2019-06-02 DIAGNOSIS — E871 Hypo-osmolality and hyponatremia: Secondary | ICD-10-CM | POA: Diagnosis not present

## 2019-06-02 DIAGNOSIS — R29810 Facial weakness: Secondary | ICD-10-CM | POA: Diagnosis not present

## 2019-06-02 DIAGNOSIS — K851 Biliary acute pancreatitis without necrosis or infection: Secondary | ICD-10-CM | POA: Diagnosis not present

## 2019-06-02 DIAGNOSIS — Z6820 Body mass index (BMI) 20.0-20.9, adult: Secondary | ICD-10-CM | POA: Diagnosis not present

## 2019-06-02 DIAGNOSIS — N189 Chronic kidney disease, unspecified: Secondary | ICD-10-CM | POA: Diagnosis not present

## 2019-06-02 DIAGNOSIS — M47812 Spondylosis without myelopathy or radiculopathy, cervical region: Secondary | ICD-10-CM | POA: Diagnosis not present

## 2019-06-02 DIAGNOSIS — C241 Malignant neoplasm of ampulla of Vater: Secondary | ICD-10-CM

## 2019-06-02 DIAGNOSIS — M4802 Spinal stenosis, cervical region: Secondary | ICD-10-CM | POA: Diagnosis not present

## 2019-06-02 DIAGNOSIS — I2510 Atherosclerotic heart disease of native coronary artery without angina pectoris: Secondary | ICD-10-CM | POA: Diagnosis not present

## 2019-06-02 DIAGNOSIS — D329 Benign neoplasm of meninges, unspecified: Secondary | ICD-10-CM | POA: Diagnosis not present

## 2019-06-02 DIAGNOSIS — I1310 Hypertensive heart and chronic kidney disease without heart failure, with stage 1 through stage 4 chronic kidney disease, or unspecified chronic kidney disease: Secondary | ICD-10-CM | POA: Diagnosis not present

## 2019-06-02 DIAGNOSIS — E1122 Type 2 diabetes mellitus with diabetic chronic kidney disease: Secondary | ICD-10-CM | POA: Diagnosis not present

## 2019-06-02 DIAGNOSIS — E872 Acidosis: Secondary | ICD-10-CM | POA: Diagnosis not present

## 2019-06-02 DIAGNOSIS — G319 Degenerative disease of nervous system, unspecified: Secondary | ICD-10-CM | POA: Diagnosis not present

## 2019-06-02 DIAGNOSIS — R4781 Slurred speech: Secondary | ICD-10-CM | POA: Diagnosis not present

## 2019-06-02 DIAGNOSIS — E785 Hyperlipidemia, unspecified: Secondary | ICD-10-CM | POA: Diagnosis not present

## 2019-06-02 DIAGNOSIS — I080 Rheumatic disorders of both mitral and aortic valves: Secondary | ICD-10-CM | POA: Diagnosis not present

## 2019-06-02 DIAGNOSIS — E43 Unspecified severe protein-calorie malnutrition: Secondary | ICD-10-CM | POA: Diagnosis not present

## 2019-06-02 DIAGNOSIS — N179 Acute kidney failure, unspecified: Secondary | ICD-10-CM | POA: Diagnosis not present

## 2019-06-02 DIAGNOSIS — I700 Atherosclerosis of aorta: Secondary | ICD-10-CM | POA: Diagnosis not present

## 2019-06-02 DIAGNOSIS — D630 Anemia in neoplastic disease: Secondary | ICD-10-CM | POA: Diagnosis not present

## 2019-06-02 DIAGNOSIS — K8031 Calculus of bile duct with cholangitis, unspecified, with obstruction: Secondary | ICD-10-CM | POA: Diagnosis not present

## 2019-06-02 DIAGNOSIS — D709 Neutropenia, unspecified: Secondary | ICD-10-CM | POA: Diagnosis not present

## 2019-06-02 NOTE — Progress Notes (Signed)
Radiation Oncology         [phone] ________________________________  Initial Outpatient Consultation - Conducted via telephone due to current COVID-19 concerns for limiting patient exposure  I spoke with the patient to conduct this consult visit via telephone to spare the patient unnecessary potential exposure in the healthcare setting during the current COVID-19 pandemic. The patient was notified in advance and was offered a [place] meeting to allow for face to face communication but unfortunately reported that they did not have the appropriate resources/technology to support such a visit and instead preferred to proceed with a telephone consult.     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   HISTORY OF PRESENT ILLNESS: Debra [name] is a 86 y.o. female seen at the request of Dr. Burr Medico for a newly noted adenocarcinoma of the ampulla. The patient had weight loss and confusion and recently presented to the ED at [facility] on 05/18/19 . Further evaluation of her brain with CT and MRI did not reveal obvious CVA but she had resolution of her symptoms. She was further worked up though due to jaundice and weight loss. Further imaging of the abdomen on 05/18/19 revealed profound intrahepatic and extrahepatic biliary ductal dilitation of the ampulla and the common bile duct measured up to 1.6 cm. There were numerous tiny gallstones and the pancreatic duct measured up to 1.4 centrally. She underwent ERCP and placement of a metal stent in the common bile duct occurred on 05/20/19 with Dr. Ardis Hughs. A biopsy of the ampulla revealed a poorly differentiated carcinoma. She has been discharged home with improvement of her bilary function. She is not a surgical candidate, nor a candidate for systemic  therapy. She's contacted today to discuss options of radiotherapy.     PREVIOUS RADIATION THERAPY: No   PAST MEDICAL HISTORY:  Past Medical History:  Diagnosis Date   CKD (chronic kidney disease)    Diabetes mellitus without complication ([place])    Gout    Hypercholesteremia    Hypertension        PAST SURGICAL HISTORY: Past Surgical History:  Procedure Laterality Date   BILIARY STENT PLACEMENT  05/20/2019   Procedure: BILIARY STENT PLACEMENT;  Surgeon: Milus Banister, MD;  Location: [facility] ENDOSCOPY;  Service: Endoscopy;;   BIOPSY  05/20/2019   Procedure: BIOPSY;  Surgeon: Milus Banister, MD;  Location: [facility] ENDOSCOPY;  Service: Endoscopy;;   ERCP N/A 05/20/2019   Procedure: ENDOSCOPIC RETROGRADE CHOLANGIOPANCREATOGRAPHY (ERCP);  Surgeon: Milus Banister, MD;  Location: [facility] ENDOSCOPY;  Service: Endoscopy;  Laterality: N/A;   TOTAL KNEE ARTHROPLASTY Left      FAMILY HISTORY:  Family History  Problem Relation Age of Onset   Diabetes Mother    Hypertension Father    Breast cancer Niece    Colon cancer Neg Hx      SOCIAL HISTORY:  reports that she has never smoked. She has never used smokeless tobacco. She reports that she does not drink alcohol or use drugs. The patient lives in [place], [place].   ALLERGIES: Other   MEDICATIONS:  Current Outpatient Medications  Medication Sig Dispense Refill   aspirin EC 81 MG tablet Take 81 mg by mouth daily.     Insulin Infusion Pump (ACCU-CHEK COMBO) KIT 1 application by Does  not apply route 2 (two) times daily. 1 kit 0   latanoprost (XALATAN) 0.005 % ophthalmic solution Place 1 drop into both eyes at bedtime.      metFORMIN (GLUCOPHAGE-XR) 500 MG 24 hr tablet Take 500 mg by mouth 2 (two) times daily.     mirtazapine (REMERON) 7.5 MG tablet Take 1 tablet (7.5 mg total) by mouth at bedtime. 30 tablet 2   senna (SENOKOT) 8.6 MG TABS tablet Take 1 tablet (8.6 mg total) by mouth at bedtime as needed for mild constipation. 120  tablet 0   triamcinolone (KENALOG) 0.025 % cream Apply 1 application topically 2 (two) times daily as needed.     No current facility-administered medications for this encounter.     REVIEW OF SYSTEMS: On review of systems, the patient and her family state that she is doing pretty well, her confusion apparently has resolved since her hospitalization, they are going to follow-up with neurologist in a few weeks.  She is tolerating most foods, but her appetite is still quite poor.  She has lost a total of about 50 pounds in the last year without intention.  She is no longer jaundiced, having any itching of her skin or yellowing of her eyes.  She is not experiencing any nausea or abdominal pain.  No other complaints are verbalized.    PHYSICAL EXAM:  Wt Readings from Last 3 Encounters:  05/29/19 146 lb 8 oz (66.5 kg)  05/18/19 145 lb (65.8 kg)  04/13/19 140 lb 9.6 oz (63.8 kg)    ECOG = 2  0 - Asymptomatic (Fully active, able to carry on all predisease activities without restriction)  1 - Symptomatic but completely ambulatory (Restricted in physically strenuous activity but ambulatory and able to carry out work of a light or sedentary nature. For example, light housework, office work)  2 - Symptomatic, <50% in bed during the day (Ambulatory and capable of all self care but unable to carry out any work activities. Up and about more than 50% of waking hours)  3 - Symptomatic, >50% in bed, but not bedbound (Capable of only limited self-care, confined to bed or chair 50% or more of waking hours)  4 - Bedbound (Completely disabled. Cannot carry on any self-care. Totally confined to bed or chair)  5 - Death   Debra [name] MM, Creech RH, Tormey DC, et al. [phone]). "Toxicity and response criteria of the [facility]". [place] Oncol. 5 (6): 649-55    LABORATORY DATA:  Lab Results  Component Value Date   WBC 9.4 05/22/2019   HGB 8.2 (L) 05/22/2019   HCT 24.5 (L)  05/22/2019   MCV 92.5 05/22/2019   PLT 456 (H) 05/22/2019   Lab Results  Component Value Date   NA 133 (L) 05/22/2019   K 3.9 05/22/2019   CL 104 05/22/2019   CO2 21 (L) 05/22/2019   Lab Results  Component Value Date   ALT 112 (H) 05/22/2019   AST 172 (H) 05/22/2019   ALKPHOS 448 (H) 05/22/2019   BILITOT 2.9 (H) 05/22/2019      RADIOGRAPHY: CT Head Wo Contrast  Result Date: 05/18/2019 CLINICAL DATA:  Ataxia with stroke suspected EXAM: CT HEAD WITHOUT CONTRAST TECHNIQUE: Contiguous axial images were obtained from the base of the skull through the vertex without intravenous contrast. COMPARISON:  02/27/2019 FINDINGS: Brain: No evidence of acute infarction, hemorrhage, hydrocephalus, extra-axial collection or mass lesion/mass effect. Chronic small vessel ischemia in the periventricular white matter. Cerebral volume  loss with ventriculomegaly Vascular: No hyperdense vessel or unexpected calcification. Skull: Normal. Negative for fracture or focal lesion. Sinuses/Orbits: No acute finding. IMPRESSION: 1. No acute finding. 2. Atrophy and chronic small vessel ischemia. Electronically Signed   By: Monte Fantasia M.D.   On: 05/18/2019 10:37   MR ANGIO HEAD WO CONTRAST  Result Date: 05/19/2019 CLINICAL DATA:  Initial evaluation for acute slurred speech, left-sided facial droop. EXAM: MR HEAD WITHOUT CONTRAST MR CIRCLE OF WILLIS WITHOUT CONTRAST MRA OF THE NECK WITHOUT AND WITH CONTRAST TECHNIQUE: Multiplanar, multiecho pulse sequences of the brain, circle of Willis and surrounding structures were obtained without intravenous contrast. Angiographic images of the neck were obtained using MRA technique without and with intravenous contrast. CONTRAST:  6.5 cc of Gadavist. COMPARISON:  Prior head CT from 05/18/2019. FINDINGS: MRI HEAD FINDINGS Brain: Diffuse prominence of the CSF containing spaces compatible with generalized age-related cerebral atrophy. Patchy and confluent T2/FLAIR hyperintensity within  the periventricular and deep white matter both cerebral hemispheres most consistent with chronic small vessel ischemic disease, mild for age. No abnormal foci of restricted diffusion to suggest acute or subacute ischemia. Gray-white matter differentiation maintained. No encephalomalacia to suggest chronic cortical infarction. No evidence for acute or chronic intracranial hemorrhage. 8 mm meningioma overlies the left frontal convexity without associated mass effect (series 6, image 15). No other mass lesion, mass effect, or midline shift. Diffuse ventricular prominence related to global parenchymal volume loss without hydrocephalus. No extra-axial fluid collection. Note made of a partially empty sella. Midline structures intact. Vascular: Major intracranial vascular flow voids are maintained. Skull and upper cervical spine: Craniocervical junction within normal limits. Multilevel degenerative spondylolysis noted within the upper cervical spine with resultant moderate spinal stenosis at the level of C3-4. Bone marrow signal intensity within normal limits. No focal marrow replacing lesion. No scalp soft tissue abnormality. Sinuses/Orbits: Patient status post bilateral ocular lens replacement. Small amount of pneumatized secretions noted within the left sphenoid sinus. Paranasal sinuses are otherwise largely clear. No significant mastoid effusion. Inner ear structures grossly normal. Other: None. MRA HEAD FINDINGS ANTERIOR CIRCULATION: Visualized distal cervical segments of the internal carotid arteries are patent with symmetric antegrade flow. Petrous, cavernous, and supraclinoid ICAs patent without flow-limiting stenosis. A1 segments widely patent. Normal anterior communicating artery complex. Anterior cerebral arteries patent to their distal aspects without stenosis. No M1 stenosis or occlusion. Normal MCA bifurcations. Distal MCA branches well perfused and symmetric. POSTERIOR CIRCULATION: Partially visualized  vertebral arteries widely patent. Right vertebral artery dominant. Both picas patent. Basilar patent to its distal aspect without stenosis. Superior cerebral arteries patent bilaterally. Right PCA supplied via the basilar. Predominant fetal type origin of the left PCA. Both PCAs well perfused to their distal aspects. No intracranial aneurysm. MRA NECK FINDINGS: AORTIC ARCH: Examination degraded by motion artifact. Visualized aortic arch of normal caliber with normal 3 vessel morphology. No flow-limiting stenosis about the origin of the great vessels. Visualized subclavian arteries widely patent. RIGHT CAROTID SYSTEM: Right CCA tortuous but widely patent to the bifurcation without stenosis. Atheromatous irregularity about the right carotid bifurcation with associated stenosis of up to approximate 50% by NASCET criteria. Right ICA patent distally to the skull base without stenosis or occlusion. LEFT CAROTID SYSTEM: Left CCA patent from its origin to the bifurcation without stenosis. Atheromatous irregularity about the left carotid bifurcation with associated stenosis of up to approximately 50% by NASCET criteria. Left ICA otherwise patent distally to the skull base without stenosis or occlusion. VERTEBRAL ARTERIES: Both vertebral arteries  arise from the subclavian arteries. Right vertebral artery dominant. Vertebral arteries not well assessed proximally due to motion. Visualized portions are mildly tortuous but widely patent without stenosis or occlusion. IMPRESSION: MRI HEAD IMPRESSION: 1. No acute intracranial infarct or other abnormality. 2. Generalized age advanced cerebral atrophy with mild chronic small vessel ischemic disease. 3. 8 mm meningioma overlying the left frontal convexity without associated mass effect. MRA HEAD IMPRESSION: Negative intracranial MRA with no large vessel occlusion. No hemodynamically significant or correctable stenosis. MRA NECK IMPRESSION: 1. Atheromatous change about the carotid  bifurcations with estimated stenoses of up to approximately 50% by NASCET criteria bilaterally. 2. Patent vertebral arteries within the neck. Right vertebral artery dominant. Electronically Signed   By: Jeannine Boga M.D.   On: 05/19/2019 20:56   MR ANGIO NECK W WO CONTRAST  Result Date: 05/19/2019 CLINICAL DATA:  Initial evaluation for acute slurred speech, left-sided facial droop. EXAM: MR HEAD WITHOUT CONTRAST MR CIRCLE OF WILLIS WITHOUT CONTRAST MRA OF THE NECK WITHOUT AND WITH CONTRAST TECHNIQUE: Multiplanar, multiecho pulse sequences of the brain, circle of Willis and surrounding structures were obtained without intravenous contrast. Angiographic images of the neck were obtained using MRA technique without and with intravenous contrast. CONTRAST:  6.5 cc of Gadavist. COMPARISON:  Prior head CT from 05/18/2019. FINDINGS: MRI HEAD FINDINGS Brain: Diffuse prominence of the CSF containing spaces compatible with generalized age-related cerebral atrophy. Patchy and confluent T2/FLAIR hyperintensity within the periventricular and deep white matter both cerebral hemispheres most consistent with chronic small vessel ischemic disease, mild for age. No abnormal foci of restricted diffusion to suggest acute or subacute ischemia. Gray-white matter differentiation maintained. No encephalomalacia to suggest chronic cortical infarction. No evidence for acute or chronic intracranial hemorrhage. 8 mm meningioma overlies the left frontal convexity without associated mass effect (series 6, image 15). No other mass lesion, mass effect, or midline shift. Diffuse ventricular prominence related to global parenchymal volume loss without hydrocephalus. No extra-axial fluid collection. Note made of a partially empty sella. Midline structures intact. Vascular: Major intracranial vascular flow voids are maintained. Skull and upper cervical spine: Craniocervical junction within normal limits. Multilevel degenerative spondylolysis  noted within the upper cervical spine with resultant moderate spinal stenosis at the level of C3-4. Bone marrow signal intensity within normal limits. No focal marrow replacing lesion. No scalp soft tissue abnormality. Sinuses/Orbits: Patient status post bilateral ocular lens replacement. Small amount of pneumatized secretions noted within the left sphenoid sinus. Paranasal sinuses are otherwise largely clear. No significant mastoid effusion. Inner ear structures grossly normal. Other: None. MRA HEAD FINDINGS ANTERIOR CIRCULATION: Visualized distal cervical segments of the internal carotid arteries are patent with symmetric antegrade flow. Petrous, cavernous, and supraclinoid ICAs patent without flow-limiting stenosis. A1 segments widely patent. Normal anterior communicating artery complex. Anterior cerebral arteries patent to their distal aspects without stenosis. No M1 stenosis or occlusion. Normal MCA bifurcations. Distal MCA branches well perfused and symmetric. POSTERIOR CIRCULATION: Partially visualized vertebral arteries widely patent. Right vertebral artery dominant. Both picas patent. Basilar patent to its distal aspect without stenosis. Superior cerebral arteries patent bilaterally. Right PCA supplied via the basilar. Predominant fetal type origin of the left PCA. Both PCAs well perfused to their distal aspects. No intracranial aneurysm. MRA NECK FINDINGS: AORTIC ARCH: Examination degraded by motion artifact. Visualized aortic arch of normal caliber with normal 3 vessel morphology. No flow-limiting stenosis about the origin of the great vessels. Visualized subclavian arteries widely patent. RIGHT CAROTID SYSTEM: Right CCA tortuous but widely patent  to the bifurcation without stenosis. Atheromatous irregularity about the right carotid bifurcation with associated stenosis of up to approximate 50% by NASCET criteria. Right ICA patent distally to the skull base without stenosis or occlusion. LEFT CAROTID  SYSTEM: Left CCA patent from its origin to the bifurcation without stenosis. Atheromatous irregularity about the left carotid bifurcation with associated stenosis of up to approximately 50% by NASCET criteria. Left ICA otherwise patent distally to the skull base without stenosis or occlusion. VERTEBRAL ARTERIES: Both vertebral arteries arise from the subclavian arteries. Right vertebral artery dominant. Vertebral arteries not well assessed proximally due to motion. Visualized portions are mildly tortuous but widely patent without stenosis or occlusion. IMPRESSION: MRI HEAD IMPRESSION: 1. No acute intracranial infarct or other abnormality. 2. Generalized age advanced cerebral atrophy with mild chronic small vessel ischemic disease. 3. 8 mm meningioma overlying the left frontal convexity without associated mass effect. MRA HEAD IMPRESSION: Negative intracranial MRA with no large vessel occlusion. No hemodynamically significant or correctable stenosis. MRA NECK IMPRESSION: 1. Atheromatous change about the carotid bifurcations with estimated stenoses of up to approximately 50% by NASCET criteria bilaterally. 2. Patent vertebral arteries within the neck. Right vertebral artery dominant. Electronically Signed   By: Jeannine Boga M.D.   On: 05/19/2019 20:56   MR BRAIN WO CONTRAST  Result Date: 05/19/2019 CLINICAL DATA:  Initial evaluation for acute slurred speech, left-sided facial droop. EXAM: MR HEAD WITHOUT CONTRAST MR CIRCLE OF WILLIS WITHOUT CONTRAST MRA OF THE NECK WITHOUT AND WITH CONTRAST TECHNIQUE: Multiplanar, multiecho pulse sequences of the brain, circle of Willis and surrounding structures were obtained without intravenous contrast. Angiographic images of the neck were obtained using MRA technique without and with intravenous contrast. CONTRAST:  6.5 cc of Gadavist. COMPARISON:  Prior head CT from 05/18/2019. FINDINGS: MRI HEAD FINDINGS Brain: Diffuse prominence of the CSF containing spaces  compatible with generalized age-related cerebral atrophy. Patchy and confluent T2/FLAIR hyperintensity within the periventricular and deep white matter both cerebral hemispheres most consistent with chronic small vessel ischemic disease, mild for age. No abnormal foci of restricted diffusion to suggest acute or subacute ischemia. Gray-white matter differentiation maintained. No encephalomalacia to suggest chronic cortical infarction. No evidence for acute or chronic intracranial hemorrhage. 8 mm meningioma overlies the left frontal convexity without associated mass effect (series 6, image 15). No other mass lesion, mass effect, or midline shift. Diffuse ventricular prominence related to global parenchymal volume loss without hydrocephalus. No extra-axial fluid collection. Note made of a partially empty sella. Midline structures intact. Vascular: Major intracranial vascular flow voids are maintained. Skull and upper cervical spine: Craniocervical junction within normal limits. Multilevel degenerative spondylolysis noted within the upper cervical spine with resultant moderate spinal stenosis at the level of C3-4. Bone marrow signal intensity within normal limits. No focal marrow replacing lesion. No scalp soft tissue abnormality. Sinuses/Orbits: Patient status post bilateral ocular lens replacement. Small amount of pneumatized secretions noted within the left sphenoid sinus. Paranasal sinuses are otherwise largely clear. No significant mastoid effusion. Inner ear structures grossly normal. Other: None. MRA HEAD FINDINGS ANTERIOR CIRCULATION: Visualized distal cervical segments of the internal carotid arteries are patent with symmetric antegrade flow. Petrous, cavernous, and supraclinoid ICAs patent without flow-limiting stenosis. A1 segments widely patent. Normal anterior communicating artery complex. Anterior cerebral arteries patent to their distal aspects without stenosis. No M1 stenosis or occlusion. Normal MCA  bifurcations. Distal MCA branches well perfused and symmetric. POSTERIOR CIRCULATION: Partially visualized vertebral arteries widely patent. Right vertebral artery dominant. Both picas  patent. Basilar patent to its distal aspect without stenosis. Superior cerebral arteries patent bilaterally. Right PCA supplied via the basilar. Predominant fetal type origin of the left PCA. Both PCAs well perfused to their distal aspects. No intracranial aneurysm. MRA NECK FINDINGS: AORTIC ARCH: Examination degraded by motion artifact. Visualized aortic arch of normal caliber with normal 3 vessel morphology. No flow-limiting stenosis about the origin of the great vessels. Visualized subclavian arteries widely patent. RIGHT CAROTID SYSTEM: Right CCA tortuous but widely patent to the bifurcation without stenosis. Atheromatous irregularity about the right carotid bifurcation with associated stenosis of up to approximate 50% by NASCET criteria. Right ICA patent distally to the skull base without stenosis or occlusion. LEFT CAROTID SYSTEM: Left CCA patent from its origin to the bifurcation without stenosis. Atheromatous irregularity about the left carotid bifurcation with associated stenosis of up to approximately 50% by NASCET criteria. Left ICA otherwise patent distally to the skull base without stenosis or occlusion. VERTEBRAL ARTERIES: Both vertebral arteries arise from the subclavian arteries. Right vertebral artery dominant. Vertebral arteries not well assessed proximally due to motion. Visualized portions are mildly tortuous but widely patent without stenosis or occlusion. IMPRESSION: MRI HEAD IMPRESSION: 1. No acute intracranial infarct or other abnormality. 2. Generalized age advanced cerebral atrophy with mild chronic small vessel ischemic disease. 3. 8 mm meningioma overlying the left frontal convexity without associated mass effect. MRA HEAD IMPRESSION: Negative intracranial MRA with no large vessel occlusion. No  hemodynamically significant or correctable stenosis. MRA NECK IMPRESSION: 1. Atheromatous change about the carotid bifurcations with estimated stenoses of up to approximately 50% by NASCET criteria bilaterally. 2. Patent vertebral arteries within the neck. Right vertebral artery dominant. Electronically Signed   By: Jeannine Boga M.D.   On: 05/19/2019 20:56   CT ABDOMEN PELVIS W CONTRAST  Result Date: 05/18/2019 CLINICAL DATA:  Abdominal pain, neutropenia, elevated LFTs EXAM: CT ABDOMEN AND PELVIS WITH CONTRAST TECHNIQUE: Multidetector CT imaging of the abdomen and pelvis was performed using the standard protocol following bolus administration of intravenous contrast. CONTRAST:  42m OMNIPAQUE IOHEXOL 300 MG/ML  SOLN COMPARISON:  03/20/2019 FINDINGS: Lower chest: No acute abnormality. Three-vessel coronary artery calcifications. Hepatobiliary: Redemonstrated profound intra and extrahepatic biliary ductal dilatation to the ampulla, which is increased compared prior examination, the common bile duct measuring up to 1.6 cm. The gallbladder is distended and contains numerous tiny gallstones. Pancreas: The pancreatic duct is profoundly dilated along its length to the ampulla, measuring up to 1.4 cm centrally. Spleen: Normal in size without significant abnormality. Adrenals/Urinary Tract: Adrenal glands are unremarkable. Kidneys are normal, without renal calculi, solid lesion, or hydronephrosis. Bladder is unremarkable. Stomach/Bowel: Stomach is within normal limits. Appendix appears normal. No evidence of bowel wall thickening, distention, or inflammatory changes. Vascular/Lymphatic: Aortic atherosclerosis. No enlarged abdominal or pelvic lymph nodes. Reproductive: No mass or other significant abnormality. Other: No abdominal wall hernia or abnormality. No abdominopelvic ascites. Musculoskeletal: No acute or significant osseous findings. IMPRESSION: 1. Profound intra and extrahepatic biliary ductal dilatation  in addition to dilatation of the pancreatic duct, slightly increased compared to prior examination dated 03/20/2019 and highly concerning for a small ampullary or pancreatic malignancy which is not directly visualized. 2.  Cholelithiasis. 3.  Coronary artery disease.  Aortic Atherosclerosis (ICD10-I70.0). Electronically Signed   By: AEddie CandleM.D.   On: 05/18/2019 11:15   DG Chest Port 1 View  Result Date: 05/18/2019 CLINICAL DATA:  Weakness and slurred speech. EXAM: PORTABLE CHEST 1 VIEW COMPARISON:  01/28/2017. FINDINGS: Trachea  is midline. Heart size normal. Thoracic aorta is calcified. Lungs are clear. No pleural fluid. IMPRESSION: 1. No acute findings. 2.  Aortic atherosclerosis (ICD10-I70.0). Electronically Signed   By: Lorin Picket M.D.   On: 05/18/2019 10:26   DG ERCP BILIARY & PANCREATIC DUCTS  Result Date: 05/20/2019 CLINICAL DATA:  Biliary and pancreatic ductal dilatation. EXAM: ERCP TECHNIQUE: Multiple spot images obtained with the fluoroscopic device and submitted for interpretation post-procedure. COMPARISON:  CT of the abdomen and pelvis on 05/18/2019 FINDINGS: Imaging obtained with a C-arm during the endoscopic procedure demonstrates cannulation of the common bile duct with contrast injection demonstrating diffuse and massive dilatation of the common bile duct as well as visualized intrahepatic ducts. A self expanding metallic biliary stent was placed in the distal CBD. IMPRESSION: Biliary obstruction treated with placement of a self expanding metallic stent in the distal CBD. These images were submitted for radiologic interpretation only. Please see the procedural report for the amount of contrast and the fluoroscopy time utilized. Electronically Signed   By: Aletta Edouard M.D.   On: 05/20/2019 16:13   ECHOCARDIOGRAM COMPLETE  Result Date: 05/19/2019    ECHOCARDIOGRAM REPORT   Patient Name:   SAHAANA WEITMAN Date of Exam: 05/19/2019 Medical Rec #:  [id number]          Height:        67.0 in Accession #:    [id number]         Weight:       145.0 lb Date of Birth:  [birth year]          BSA:          1.764 m Patient Age:    86 years           BP:           103/58 mmHg Patient Gender: F                  HR:           92 bpm. Exam Location:  Inpatient Procedure: 2D Echo Indications:    stroke like symptom  History:        Patient has no prior history of Echocardiogram examinations.                 Risk Factors:Diabetes, Dyslipidemia and Hypertension.  Sonographer:    Johny Chess Referring Phys: [id number] TAYE T GONFA IMPRESSIONS  1. Left ventricular ejection fraction, by estimation, is 60 to 65%. The left ventricle has normal function. The left ventricle has no regional wall motion abnormalities. Left ventricular diastolic parameters were normal.  2. Right ventricular systolic function is normal. The right ventricular size is normal.  3. The mitral valve is normal in structure. Trivial mitral valve regurgitation. No evidence of mitral stenosis.  4. The aortic valve is tricuspid. Aortic valve regurgitation is not visualized. Mild to moderate aortic valve sclerosis/calcification is present, without any evidence of aortic stenosis.  5. The inferior vena cava is normal in size with greater than 50% respiratory variability, suggesting right atrial pressure of 3 mmHg. Comparison(s): No prior Echocardiogram. FINDINGS  Left Ventricle: Left ventricular ejection fraction, by estimation, is 60 to 65%. The left ventricle has normal function. The left ventricle has no regional wall motion abnormalities. The left ventricular internal cavity size was normal in size. There is  no left ventricular hypertrophy. Left ventricular diastolic parameters were normal. Right Ventricle: The right ventricular size is normal. No increase in right ventricular wall thickness.  Right ventricular systolic function is normal. Left Atrium: Left atrial size was normal in size. Right Atrium: Right atrial size was normal in size.  Pericardium: There is no evidence of pericardial effusion. Mitral Valve: The mitral valve is normal in structure. Trivial mitral valve regurgitation. No evidence of mitral valve stenosis. Tricuspid Valve: The tricuspid valve is normal in structure. Tricuspid valve regurgitation is not demonstrated. No evidence of tricuspid stenosis. Aortic Valve: The aortic valve is tricuspid. . There is moderate thickening and moderate calcification of the aortic valve. Aortic valve regurgitation is not visualized. Mild to moderate aortic valve sclerosis/calcification is present, without any evidence of aortic stenosis. Moderate aortic valve annular calcification. There is moderate thickening of the aortic valve. There is moderate calcification of the aortic valve. Pulmonic Valve: The pulmonic valve was not well visualized. Pulmonic valve regurgitation is not visualized. No evidence of pulmonic stenosis. Aorta: The aortic root was not well visualized and the ascending aorta was not well visualized. Venous: The inferior vena cava is normal in size with greater than 50% respiratory variability, suggesting right atrial pressure of 3 mmHg. IAS/Shunts: No atrial level shunt detected by color flow Doppler.  LEFT VENTRICLE PLAX 2D LVIDd:         3.00 cm  Diastology LVIDs:         2.05 cm  LV e' lateral: 11.40 cm/s LV PW:         0.80 cm  LV e' medial:  6.96 cm/s LV IVS:        0.90 cm LVOT diam:     1.70 cm LV SV:         42 LV SV Index:   24 LVOT Area:     2.27 cm  RIGHT VENTRICLE RV S prime:     12.80 cm/s TAPSE (M-mode): 1.5 cm LEFT ATRIUM             Index       RIGHT ATRIUM          Index LA diam:        2.70 cm 1.53 cm/m  RA Area:     8.60 cm LA Vol (A2C):   38.2 ml 21.66 ml/m RA Volume:   15.10 ml 8.56 ml/m LA Vol (A4C):   32.7 ml 18.54 ml/m LA Biplane Vol: 35.4 ml 20.07 ml/m  AORTIC VALVE LVOT Vmax:   104.00 cm/s LVOT Vmean:  69.400 cm/s LVOT VTI:    0.183 m  AORTA Ao Root diam: 2.90 cm  SHUNTS Systemic VTI:  0.18 m  Systemic Diam: 1.70 cm Buford Dresser MD Electronically signed by Buford Dresser MD Signature Date/Time: 05/19/2019/8:44:09 PM    Final    VAS [place] LOWER EXTREMITY VENOUS (DVT)  Result Date: 05/21/2019  Lower Venous DVTStudy Other Indications: TIA/ new diagnosis cancer. Comparison Study: none Performing Technologist: June Leap RDMS, RVT  Examination Guidelines: A complete evaluation includes B-mode imaging, spectral Doppler, color Doppler, and power Doppler as needed of all accessible portions of each vessel. Bilateral testing is considered an integral part of a complete examination. Limited examinations for reoccurring indications may be performed as noted. The reflux portion of the exam is performed with the patient in reverse Trendelenburg.  +---------+---------------+---------+-----------+----------+--------------+  RIGHT     Compressibility Phasicity Spontaneity Properties Thrombus Aging  +---------+---------------+---------+-----------+----------+--------------+  CFV       Full            Yes       Yes                                    +---------+---------------+---------+-----------+----------+--------------+  SFJ       Full                                                             +---------+---------------+---------+-----------+----------+--------------+  FV Prox   Full                                                             +---------+---------------+---------+-----------+----------+--------------+  FV Mid    Full                                                             +---------+---------------+---------+-----------+----------+--------------+  FV Distal Full                                                             +---------+---------------+---------+-----------+----------+--------------+  PFV       Full                                                             +---------+---------------+---------+-----------+----------+--------------+  POP       Full            Yes        Yes                                    +---------+---------------+---------+-----------+----------+--------------+  PTV       Full                                                             +---------+---------------+---------+-----------+----------+--------------+  PERO      Full                                                             +---------+---------------+---------+-----------+----------+--------------+   +---------+---------------+---------+-----------+----------+--------------+  LEFT      Compressibility Phasicity Spontaneity Properties Thrombus Aging  +---------+---------------+---------+-----------+----------+--------------+  CFV       Full            Yes       Yes                                    +---------+---------------+---------+-----------+----------+--------------+  SFJ       Full                                                             +---------+---------------+---------+-----------+----------+--------------+  FV Prox   Full                                                             +---------+---------------+---------+-----------+----------+--------------+  FV Mid    Full                                                             +---------+---------------+---------+-----------+----------+--------------+  FV Distal Full                                                             +---------+---------------+---------+-----------+----------+--------------+  PFV       Full                                                             +---------+---------------+---------+-----------+----------+--------------+  POP       Full            Yes       Yes                                    +---------+---------------+---------+-----------+----------+--------------+  PTV       Full                                                             +---------+---------------+---------+-----------+----------+--------------+  PERO      Full                                                              +---------+---------------+---------+-----------+----------+--------------+     Summary: RIGHT: - There is no evidence of deep vein thrombosis in the lower extremity.  - No cystic structure found in the popliteal fossa.  LEFT: - There is no evidence of deep vein thrombosis in the lower extremity.  - No cystic structure found in the popliteal fossa.  *See table(s)  above for measurements and observations. Electronically signed by Servando Snare MD on 05/21/2019 at 5:26:54 PM.    Final        IMPRESSION/PLAN: 1. Poorly differentiated adenocarcinoma of the ampulla. Dr. Lisbeth Renshaw discusses the pathology findings and reviews the nature of adenocarcinoma of the hepatobiliary/ampulary sites. He discusses the options of stereotactic body radiotherapy (SBRT) as a way to try to achieve better local control, though she and her family understand this would not be curative, but that it would potentially give better longer term control compared to a conventional palliative course.  We discussed the risks, benefits, short, and long term effects of radiotherapy, and the patient is interested in proceeding. Dr. Lisbeth Renshaw discusses the delivery and logistics of radiotherapy and anticipates a course of 5 fractions of radiotherapy. She will need IV contrast. Dr. Lisbeth Renshaw will proceed without fiducial markers as well. She will come in next week for simulation at which time she will sign written consent.   Given current concerns for patient exposure during the COVID-19 pandemic, this encounter was conducted via telephone.  The patient has provided two factor identification and has given verbal consent for this type of encounter and has been advised to only accept a meeting of this type in a secure network environment. The time spent during this encounter was 45 minutes including preparation, discussion, and coordination of the patient's care. The attendants for this meeting include Blenda Nicely, RN, Dr. Lisbeth Renshaw, Hayden Pedro  and  Jacquiline Doe and her four children.   During the encounter,  Blenda Nicely, RN, Dr. Lisbeth Renshaw, and Hayden Pedro were located at [facility] Radiation Oncology Department.  Jacquiline Doe and her four children were located at home.    The above documentation reflects my direct findings during this shared patient visit. Please see the separate note by Dr. Lisbeth Renshaw on this date for the remainder of the patient's plan of care.    Carola Rhine, PAC

## 2019-06-03 ENCOUNTER — Ambulatory Visit (HOSPITAL_COMMUNITY)
Admission: RE | Admit: 2019-06-03 | Discharge: 2019-06-03 | Disposition: A | Discharge status: Home / Self Care | Payer: Medicare Other | Source: Ambulatory Visit | Attending: Physician Assistant | Admitting: Physician Assistant

## 2019-06-03 ENCOUNTER — Other Ambulatory Visit: Payer: Self-pay

## 2019-06-03 ENCOUNTER — Telehealth: Payer: Self-pay | Admitting: *Deleted

## 2019-06-03 ENCOUNTER — Encounter (HOSPITAL_COMMUNITY): Payer: Self-pay

## 2019-06-03 DIAGNOSIS — C241 Malignant neoplasm of ampulla of Vater: Secondary | ICD-10-CM | POA: Diagnosis not present

## 2019-06-03 DIAGNOSIS — R634 Abnormal weight loss: Secondary | ICD-10-CM | POA: Diagnosis present

## 2019-06-03 DIAGNOSIS — R630 Anorexia: Secondary | ICD-10-CM | POA: Diagnosis present

## 2019-06-03 IMAGING — CT CT CHEST W/O CM
2 of 4 series · 15 of 36 positions shown, 18 images · non-contrast
Comparison: [DATE]

CLINICAL DATA: Staging of ampullary cancer, new diagnosis of
adenocarcinoma of the ampulla of Vater

EXAM:
CT CHEST WITHOUT CONTRAST
TECHNIQUE: Multidetector CT imaging of the chest was performed following the
standard protocol without IV contrast.

[Series 2: thorax · axial · 0.73mm/px · z∈[-133,+79]mm · 12 of 126 slices shown, 15 images]
[im 10/126  mediastinal]
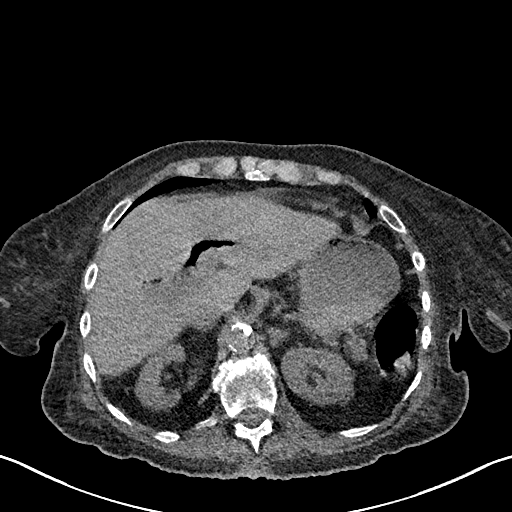
[im 10/126  lung]
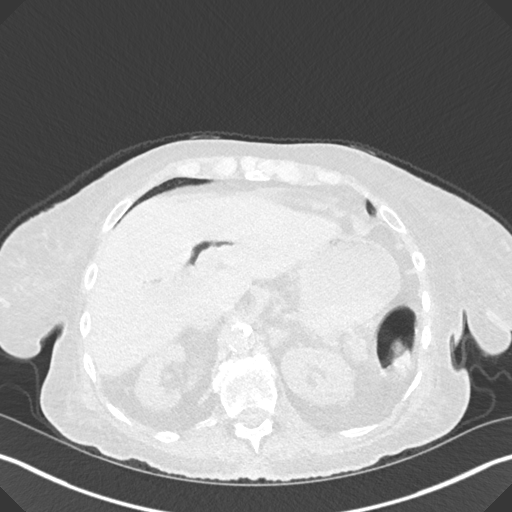
[im 20/126  lung]
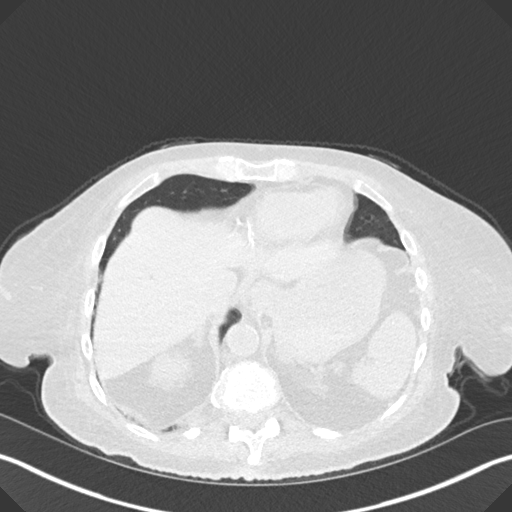
[im 29/126  lung]
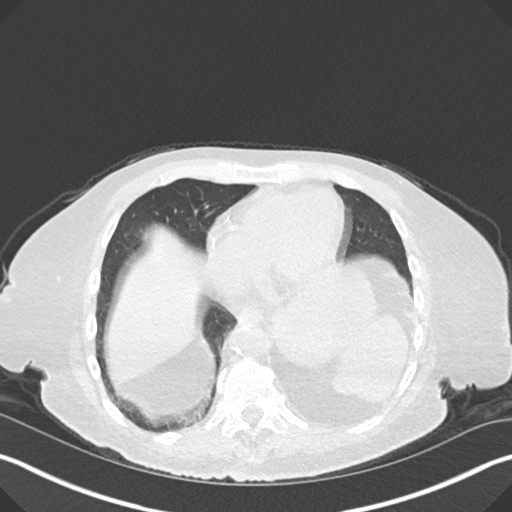
[im 39/126  lung]
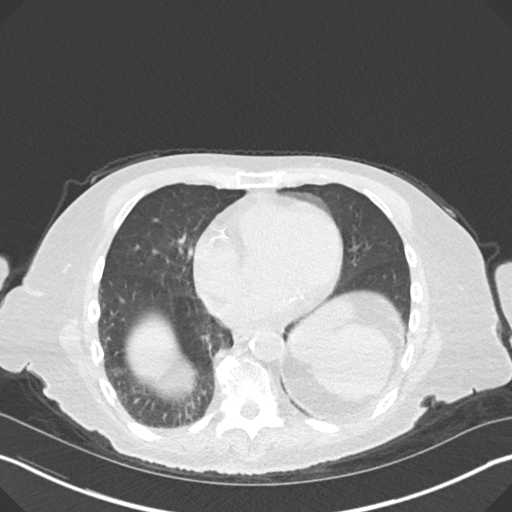
[im 49/126  mediastinal]
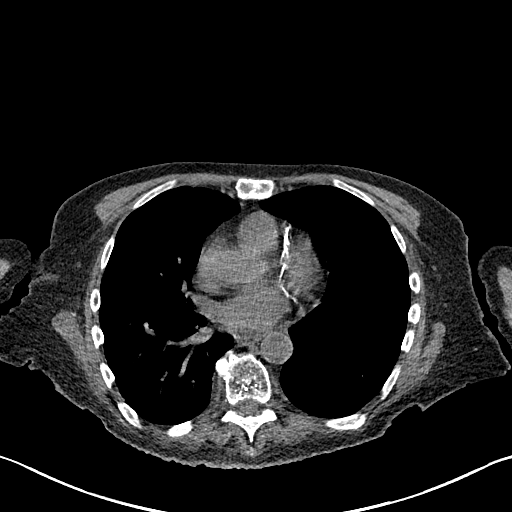
[im 49/126  lung]
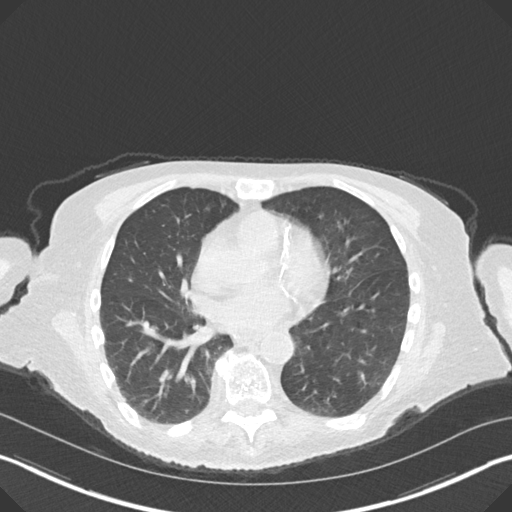
[im 58/126  lung]
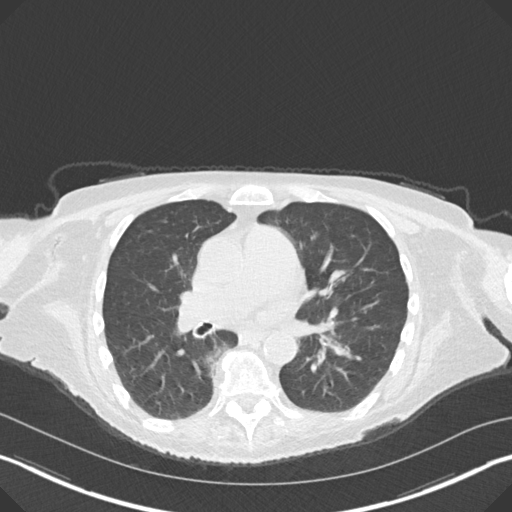
[im 68/126  lung]
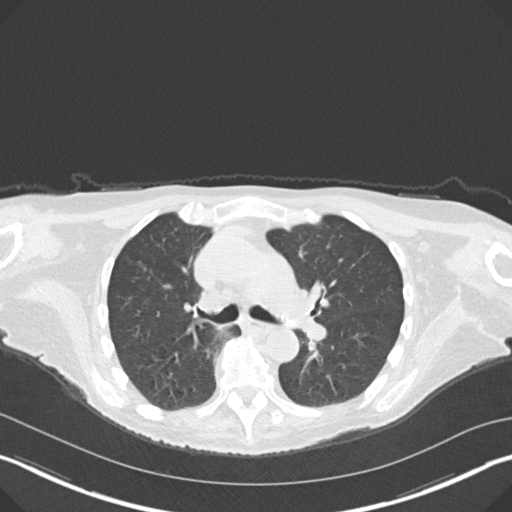
[im 77/126  lung]
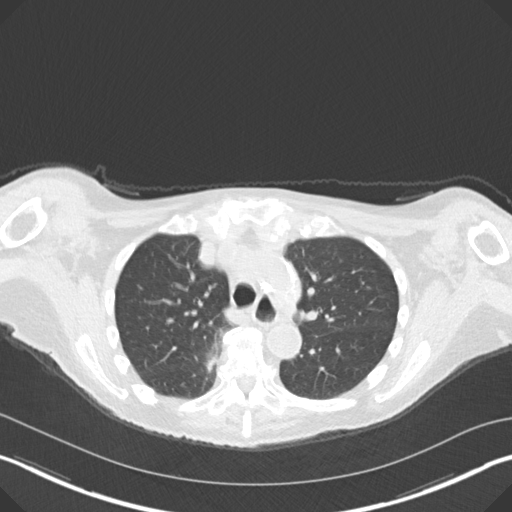
[im 87/126  mediastinal]
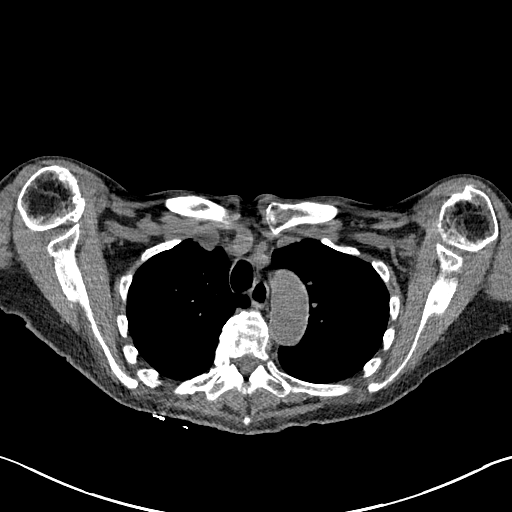
[im 87/126  lung]
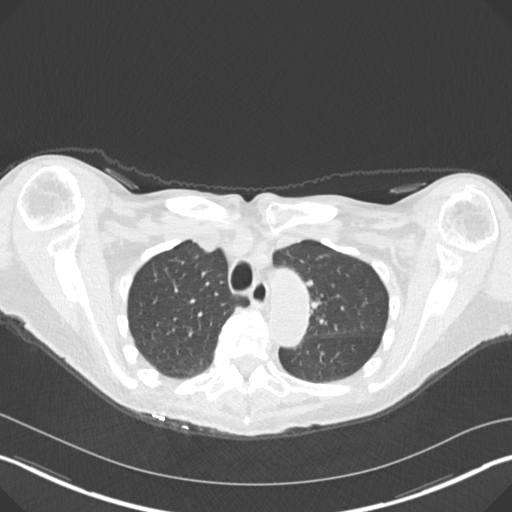
[im 97/126  lung]
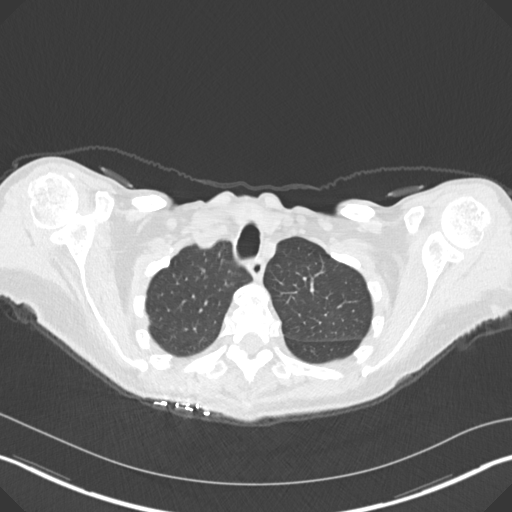
[im 106/126  lung]
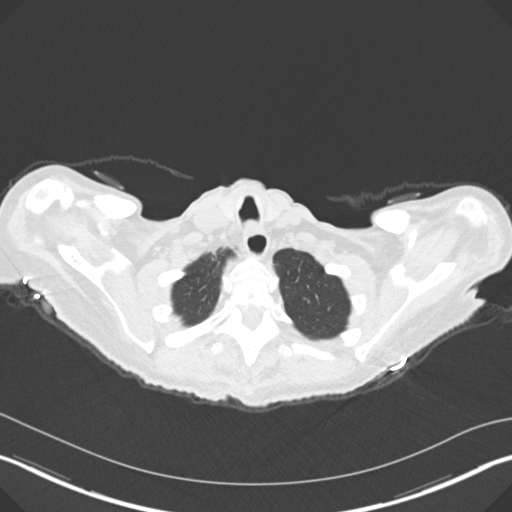
[im 116/126  lung]
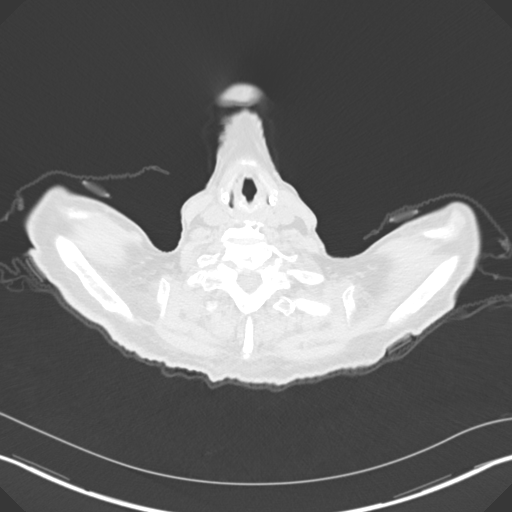

[Series 5: coronal · coronal · 0.52mm/px · 3 of 116 slices shown]
[im 24/116  lung]
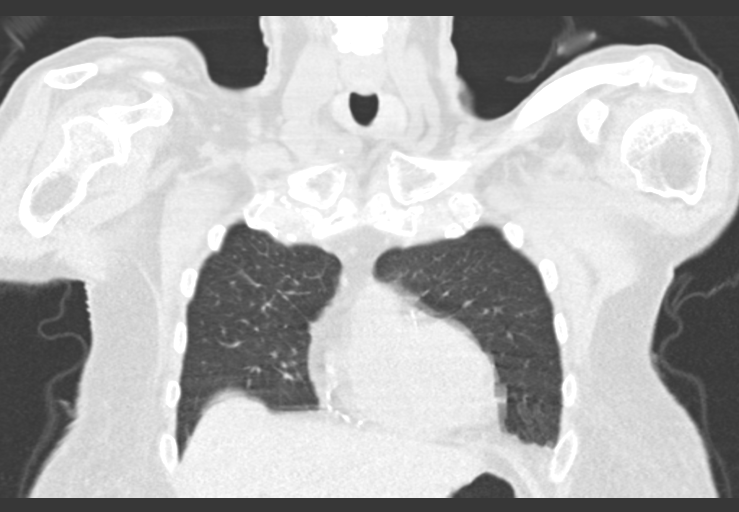
[im 47/116  lung]
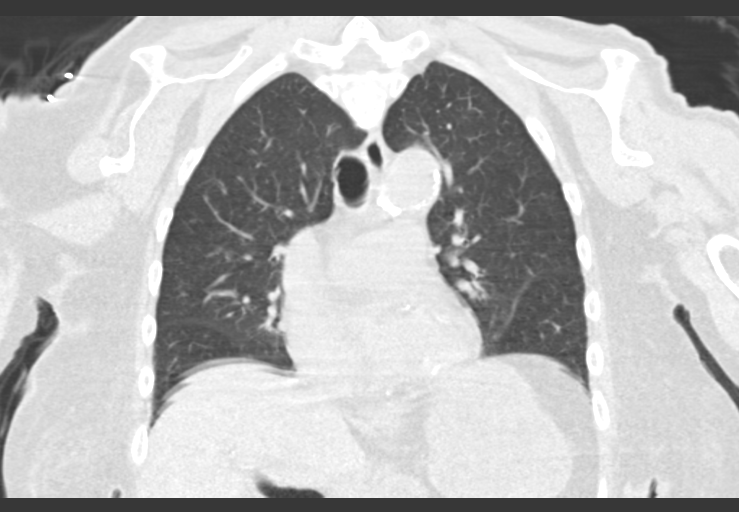
[im 70/116  lung]
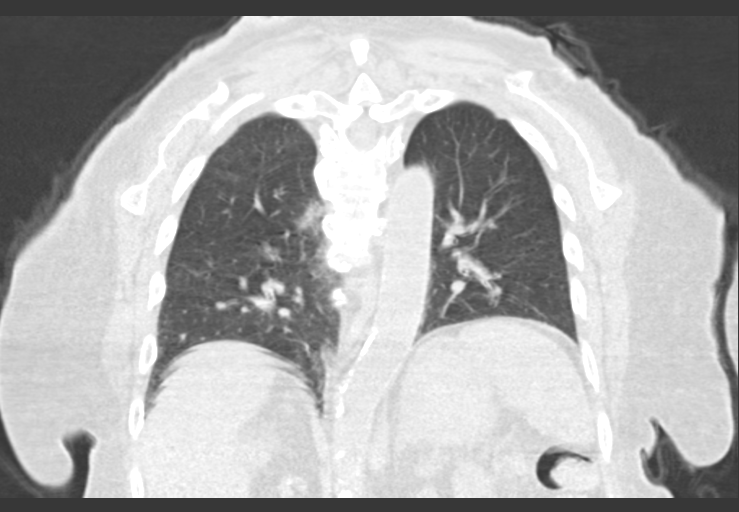

[15 of 36 positions shown; findings below may reference images not displayed]

FINDINGS: Cardiovascular: Calcified atherosclerotic plaque throughout the
thoracic aorta, moderate. Three-vessel coronary artery
calcification. Limited assessment of cardiovascular structures given
lack of intravenous contrast. No pericardial effusion. Dilated
central pulmonary vasculature up to 3.6 cm. Heart size is normal.

Mediastinum/Nodes: Thoracic inlet structures are normal. No axillary
lymphadenopathy. No mediastinal lymphadenopathy. No hilar
lymphadenopathy. Esophagus mildly patulous.

Lungs/Pleura: No consolidation. No pleural effusion. Limited
assessment of the lung bases. This is due to respiratory motion. No
sign of pulmonary mass or suspicious nodule within limitations of
the current study. Airways are patent.

Upper Abdomen: Pneumobilia following stent placement. Cholelithiasis
partially imaged. No acute upper abdominal process.

Musculoskeletal: No chest wall lesion. No destructive bone finding.
Spinal degenerative changes.
IMPRESSION: 1. No evidence of metastatic disease within the chest, study mildly
limited by respiratory motion at the lung bases.
2. Dilated central pulmonary vasculature up to 3.6 cm, likely
reflects pulmonary arterial hypertension
3. Three-vessel coronary artery calcification.
4. Aortic atherosclerosis.

Aortic Atherosclerosis ([3K]-[3K]).

## 2019-06-03 NOTE — Telephone Encounter (Signed)
CALLED PATIENT'S DAUGHTER MARIE Gastelum TO INFORM OF LABS ON 06-08-19 - ARRIVAL TIME- 8:30 AM, SPOKE WITH MARIE Haigler AND SHE IS AWARE OF THIS APPT.

## 2019-06-04 DIAGNOSIS — D630 Anemia in neoplastic disease: Secondary | ICD-10-CM | POA: Diagnosis not present

## 2019-06-04 DIAGNOSIS — I080 Rheumatic disorders of both mitral and aortic valves: Secondary | ICD-10-CM | POA: Diagnosis not present

## 2019-06-04 DIAGNOSIS — M47812 Spondylosis without myelopathy or radiculopathy, cervical region: Secondary | ICD-10-CM | POA: Diagnosis not present

## 2019-06-04 DIAGNOSIS — Z6820 Body mass index (BMI) 20.0-20.9, adult: Secondary | ICD-10-CM | POA: Diagnosis not present

## 2019-06-04 DIAGNOSIS — M4802 Spinal stenosis, cervical region: Secondary | ICD-10-CM | POA: Diagnosis not present

## 2019-06-04 DIAGNOSIS — I700 Atherosclerosis of aorta: Secondary | ICD-10-CM | POA: Diagnosis not present

## 2019-06-04 DIAGNOSIS — D709 Neutropenia, unspecified: Secondary | ICD-10-CM | POA: Diagnosis not present

## 2019-06-04 DIAGNOSIS — N179 Acute kidney failure, unspecified: Secondary | ICD-10-CM | POA: Diagnosis not present

## 2019-06-04 DIAGNOSIS — G319 Degenerative disease of nervous system, unspecified: Secondary | ICD-10-CM | POA: Diagnosis not present

## 2019-06-04 DIAGNOSIS — D631 Anemia in chronic kidney disease: Secondary | ICD-10-CM | POA: Diagnosis not present

## 2019-06-04 DIAGNOSIS — I2510 Atherosclerotic heart disease of native coronary artery without angina pectoris: Secondary | ICD-10-CM | POA: Diagnosis not present

## 2019-06-04 DIAGNOSIS — R29810 Facial weakness: Secondary | ICD-10-CM | POA: Diagnosis not present

## 2019-06-04 DIAGNOSIS — I1310 Hypertensive heart and chronic kidney disease without heart failure, with stage 1 through stage 4 chronic kidney disease, or unspecified chronic kidney disease: Secondary | ICD-10-CM | POA: Diagnosis not present

## 2019-06-04 DIAGNOSIS — K851 Biliary acute pancreatitis without necrosis or infection: Secondary | ICD-10-CM | POA: Diagnosis not present

## 2019-06-04 DIAGNOSIS — E785 Hyperlipidemia, unspecified: Secondary | ICD-10-CM | POA: Diagnosis not present

## 2019-06-04 DIAGNOSIS — R4781 Slurred speech: Secondary | ICD-10-CM | POA: Diagnosis not present

## 2019-06-04 DIAGNOSIS — E1122 Type 2 diabetes mellitus with diabetic chronic kidney disease: Secondary | ICD-10-CM | POA: Diagnosis not present

## 2019-06-04 DIAGNOSIS — K8031 Calculus of bile duct with cholangitis, unspecified, with obstruction: Secondary | ICD-10-CM | POA: Diagnosis not present

## 2019-06-04 DIAGNOSIS — N189 Chronic kidney disease, unspecified: Secondary | ICD-10-CM | POA: Diagnosis not present

## 2019-06-04 DIAGNOSIS — E871 Hypo-osmolality and hyponatremia: Secondary | ICD-10-CM | POA: Diagnosis not present

## 2019-06-04 DIAGNOSIS — D329 Benign neoplasm of meninges, unspecified: Secondary | ICD-10-CM | POA: Diagnosis not present

## 2019-06-04 DIAGNOSIS — E43 Unspecified severe protein-calorie malnutrition: Secondary | ICD-10-CM | POA: Diagnosis not present

## 2019-06-04 DIAGNOSIS — E872 Acidosis: Secondary | ICD-10-CM | POA: Diagnosis not present

## 2019-06-04 LAB — SURGICAL PATHOLOGY

## 2019-06-08 ENCOUNTER — Ambulatory Visit
Admission: RE | Admit: 2019-06-08 | Discharge: 2019-06-08 | Disposition: A | Discharge status: Home / Self Care | Payer: Medicare Other | Source: Ambulatory Visit | Attending: Radiation Oncology | Admitting: Radiation Oncology

## 2019-06-08 ENCOUNTER — Other Ambulatory Visit: Payer: Self-pay

## 2019-06-08 ENCOUNTER — Other Ambulatory Visit: Payer: Self-pay | Admitting: Radiation Oncology

## 2019-06-08 VITALS — BP 92/80 | HR 115 | Temp 97.5°F | Resp 18 | Ht 67.0 in | Wt 140.5 lb

## 2019-06-08 DIAGNOSIS — C241 Malignant neoplasm of ampulla of Vater: Secondary | ICD-10-CM

## 2019-06-08 LAB — CMP (CANCER CENTER ONLY)
ALT: 18 U/L (ref 0–44)
AST: 21 U/L (ref 15–41)
Albumin: 2 g/dL — ABNORMAL LOW (ref 3.5–5.0)
Alkaline Phosphatase: 171 U/L — ABNORMAL HIGH (ref 38–126)
Anion gap: 13 (ref 5–15)
BUN: 9 mg/dL (ref 8–23)
CO2: 21 mmol/L — ABNORMAL LOW (ref 22–32)
Calcium: 8.6 mg/dL — ABNORMAL LOW (ref 8.9–10.3)
Chloride: 106 mmol/L (ref 98–111)
Creatinine: 0.82 mg/dL (ref 0.44–1.00)
GFR, Est AFR Am: >60 mL/min (ref >60)
GFR, Estimated: >60 mL/min (ref >60)
Glucose, Bld: 126 mg/dL — ABNORMAL HIGH (ref 70–99)
Potassium: 4 mmol/L (ref 3.5–5.1)
Sodium: 140 mmol/L (ref 135–145)
Total Bilirubin: 1.7 mg/dL — ABNORMAL HIGH (ref 0.3–1.2)
Total Protein: 6.3 g/dL — ABNORMAL LOW (ref 6.5–8.1)

## 2019-06-08 MED ORDER — SODIUM CHLORIDE 0.9% FLUSH
10.0000 mL | Freq: Once | INTRAVENOUS | Status: AC
  Administered 2019-06-08: 10 mL via INTRAVENOUS

## 2019-06-08 NOTE — Progress Notes (Signed)
Has armband been applied?  Yes  Does patient have an allergy to IV contrast dye?: No   Has patient ever received premedication for IV contrast dye?: n/a  Does patient take metformin?: Yes  If patient does take metformin when was the last dose: 06/07/2019 Evening time  Date of lab work: 06/08/2019 BUN: 9 CR: 0.82 Egfr:>60  IV site: Left Forearm  Has IV site been added to flowsheet?  Yes

## 2019-06-10 ENCOUNTER — Ambulatory Visit
Admission: RE | Admit: 2019-06-10 | Discharge: 2019-06-10 | Disposition: A | Discharge status: Home / Self Care | Payer: Medicare Other | Source: Ambulatory Visit | Attending: Radiation Oncology | Admitting: Radiation Oncology

## 2019-06-10 ENCOUNTER — Other Ambulatory Visit: Payer: Self-pay

## 2019-06-10 DIAGNOSIS — C241 Malignant neoplasm of ampulla of Vater: Secondary | ICD-10-CM

## 2019-06-10 LAB — BUN & CREATININE (CHCC)
BUN: 11 mg/dL (ref 8–23)
Creatinine: 0.86 mg/dL (ref 0.44–1.00)
GFR, Est AFR Am: >60 mL/min (ref >60)
GFR, Estimated: >60 mL/min (ref >60)

## 2019-06-11 DIAGNOSIS — Z6820 Body mass index (BMI) 20.0-20.9, adult: Secondary | ICD-10-CM | POA: Diagnosis not present

## 2019-06-11 DIAGNOSIS — N179 Acute kidney failure, unspecified: Secondary | ICD-10-CM | POA: Diagnosis not present

## 2019-06-11 DIAGNOSIS — D329 Benign neoplasm of meninges, unspecified: Secondary | ICD-10-CM | POA: Diagnosis not present

## 2019-06-11 DIAGNOSIS — I700 Atherosclerosis of aorta: Secondary | ICD-10-CM | POA: Diagnosis not present

## 2019-06-11 DIAGNOSIS — E872 Acidosis: Secondary | ICD-10-CM | POA: Diagnosis not present

## 2019-06-11 DIAGNOSIS — E785 Hyperlipidemia, unspecified: Secondary | ICD-10-CM | POA: Diagnosis not present

## 2019-06-11 DIAGNOSIS — N189 Chronic kidney disease, unspecified: Secondary | ICD-10-CM | POA: Diagnosis not present

## 2019-06-11 DIAGNOSIS — M47812 Spondylosis without myelopathy or radiculopathy, cervical region: Secondary | ICD-10-CM | POA: Diagnosis not present

## 2019-06-11 DIAGNOSIS — R29810 Facial weakness: Secondary | ICD-10-CM | POA: Diagnosis not present

## 2019-06-11 DIAGNOSIS — E871 Hypo-osmolality and hyponatremia: Secondary | ICD-10-CM | POA: Diagnosis not present

## 2019-06-11 DIAGNOSIS — I2510 Atherosclerotic heart disease of native coronary artery without angina pectoris: Secondary | ICD-10-CM | POA: Diagnosis not present

## 2019-06-11 DIAGNOSIS — D630 Anemia in neoplastic disease: Secondary | ICD-10-CM | POA: Diagnosis not present

## 2019-06-11 DIAGNOSIS — K8031 Calculus of bile duct with cholangitis, unspecified, with obstruction: Secondary | ICD-10-CM | POA: Diagnosis not present

## 2019-06-11 DIAGNOSIS — I1310 Hypertensive heart and chronic kidney disease without heart failure, with stage 1 through stage 4 chronic kidney disease, or unspecified chronic kidney disease: Secondary | ICD-10-CM | POA: Diagnosis not present

## 2019-06-11 DIAGNOSIS — K851 Biliary acute pancreatitis without necrosis or infection: Secondary | ICD-10-CM | POA: Diagnosis not present

## 2019-06-11 DIAGNOSIS — D631 Anemia in chronic kidney disease: Secondary | ICD-10-CM | POA: Diagnosis not present

## 2019-06-11 DIAGNOSIS — I080 Rheumatic disorders of both mitral and aortic valves: Secondary | ICD-10-CM | POA: Diagnosis not present

## 2019-06-11 DIAGNOSIS — E1122 Type 2 diabetes mellitus with diabetic chronic kidney disease: Secondary | ICD-10-CM | POA: Diagnosis not present

## 2019-06-11 DIAGNOSIS — M4802 Spinal stenosis, cervical region: Secondary | ICD-10-CM | POA: Diagnosis not present

## 2019-06-11 DIAGNOSIS — G319 Degenerative disease of nervous system, unspecified: Secondary | ICD-10-CM | POA: Diagnosis not present

## 2019-06-11 DIAGNOSIS — D709 Neutropenia, unspecified: Secondary | ICD-10-CM | POA: Diagnosis not present

## 2019-06-11 DIAGNOSIS — R4781 Slurred speech: Secondary | ICD-10-CM | POA: Diagnosis not present

## 2019-06-11 DIAGNOSIS — E43 Unspecified severe protein-calorie malnutrition: Secondary | ICD-10-CM | POA: Diagnosis not present

## 2019-06-16 DIAGNOSIS — I700 Atherosclerosis of aorta: Secondary | ICD-10-CM | POA: Diagnosis not present

## 2019-06-16 DIAGNOSIS — K851 Biliary acute pancreatitis without necrosis or infection: Secondary | ICD-10-CM | POA: Diagnosis not present

## 2019-06-16 DIAGNOSIS — M47812 Spondylosis without myelopathy or radiculopathy, cervical region: Secondary | ICD-10-CM | POA: Diagnosis not present

## 2019-06-16 DIAGNOSIS — Z6820 Body mass index (BMI) 20.0-20.9, adult: Secondary | ICD-10-CM | POA: Diagnosis not present

## 2019-06-16 DIAGNOSIS — D631 Anemia in chronic kidney disease: Secondary | ICD-10-CM | POA: Diagnosis not present

## 2019-06-16 DIAGNOSIS — G319 Degenerative disease of nervous system, unspecified: Secondary | ICD-10-CM | POA: Diagnosis not present

## 2019-06-16 DIAGNOSIS — D329 Benign neoplasm of meninges, unspecified: Secondary | ICD-10-CM | POA: Diagnosis not present

## 2019-06-16 DIAGNOSIS — D709 Neutropenia, unspecified: Secondary | ICD-10-CM | POA: Diagnosis not present

## 2019-06-16 DIAGNOSIS — E1122 Type 2 diabetes mellitus with diabetic chronic kidney disease: Secondary | ICD-10-CM | POA: Diagnosis not present

## 2019-06-16 DIAGNOSIS — N179 Acute kidney failure, unspecified: Secondary | ICD-10-CM | POA: Diagnosis not present

## 2019-06-16 DIAGNOSIS — I080 Rheumatic disorders of both mitral and aortic valves: Secondary | ICD-10-CM | POA: Diagnosis not present

## 2019-06-16 DIAGNOSIS — N189 Chronic kidney disease, unspecified: Secondary | ICD-10-CM | POA: Diagnosis not present

## 2019-06-16 DIAGNOSIS — R4781 Slurred speech: Secondary | ICD-10-CM | POA: Diagnosis not present

## 2019-06-16 DIAGNOSIS — D630 Anemia in neoplastic disease: Secondary | ICD-10-CM | POA: Diagnosis not present

## 2019-06-16 DIAGNOSIS — I2510 Atherosclerotic heart disease of native coronary artery without angina pectoris: Secondary | ICD-10-CM | POA: Diagnosis not present

## 2019-06-16 DIAGNOSIS — E871 Hypo-osmolality and hyponatremia: Secondary | ICD-10-CM | POA: Diagnosis not present

## 2019-06-16 DIAGNOSIS — E785 Hyperlipidemia, unspecified: Secondary | ICD-10-CM | POA: Diagnosis not present

## 2019-06-16 DIAGNOSIS — M4802 Spinal stenosis, cervical region: Secondary | ICD-10-CM | POA: Diagnosis not present

## 2019-06-16 DIAGNOSIS — I1310 Hypertensive heart and chronic kidney disease without heart failure, with stage 1 through stage 4 chronic kidney disease, or unspecified chronic kidney disease: Secondary | ICD-10-CM | POA: Diagnosis not present

## 2019-06-16 DIAGNOSIS — K8031 Calculus of bile duct with cholangitis, unspecified, with obstruction: Secondary | ICD-10-CM | POA: Diagnosis not present

## 2019-06-16 DIAGNOSIS — E872 Acidosis: Secondary | ICD-10-CM | POA: Diagnosis not present

## 2019-06-16 DIAGNOSIS — R29810 Facial weakness: Secondary | ICD-10-CM | POA: Diagnosis not present

## 2019-06-16 DIAGNOSIS — E43 Unspecified severe protein-calorie malnutrition: Secondary | ICD-10-CM | POA: Diagnosis not present

## 2019-06-17 ENCOUNTER — Other Ambulatory Visit: Payer: Self-pay

## 2019-06-17 ENCOUNTER — Ambulatory Visit
Admission: RE | Admit: 2019-06-17 | Discharge: 2019-06-17 | Disposition: A | Discharge status: Home / Self Care | Payer: Medicare Other | Source: Ambulatory Visit | Attending: Radiation Oncology | Admitting: Radiation Oncology

## 2019-06-17 DIAGNOSIS — C241 Malignant neoplasm of ampulla of Vater: Secondary | ICD-10-CM | POA: Insufficient documentation

## 2019-06-18 ENCOUNTER — Ambulatory Visit: Payer: Medicare Other | Admitting: Radiation Oncology

## 2019-06-18 DIAGNOSIS — K8031 Calculus of bile duct with cholangitis, unspecified, with obstruction: Secondary | ICD-10-CM | POA: Diagnosis not present

## 2019-06-18 DIAGNOSIS — K851 Biliary acute pancreatitis without necrosis or infection: Secondary | ICD-10-CM | POA: Diagnosis not present

## 2019-06-18 DIAGNOSIS — D709 Neutropenia, unspecified: Secondary | ICD-10-CM | POA: Diagnosis not present

## 2019-06-18 DIAGNOSIS — I2510 Atherosclerotic heart disease of native coronary artery without angina pectoris: Secondary | ICD-10-CM | POA: Diagnosis not present

## 2019-06-18 DIAGNOSIS — D631 Anemia in chronic kidney disease: Secondary | ICD-10-CM | POA: Diagnosis not present

## 2019-06-18 DIAGNOSIS — Z6820 Body mass index (BMI) 20.0-20.9, adult: Secondary | ICD-10-CM | POA: Diagnosis not present

## 2019-06-18 DIAGNOSIS — E785 Hyperlipidemia, unspecified: Secondary | ICD-10-CM | POA: Diagnosis not present

## 2019-06-18 DIAGNOSIS — I700 Atherosclerosis of aorta: Secondary | ICD-10-CM | POA: Diagnosis not present

## 2019-06-18 DIAGNOSIS — I080 Rheumatic disorders of both mitral and aortic valves: Secondary | ICD-10-CM | POA: Diagnosis not present

## 2019-06-18 DIAGNOSIS — D329 Benign neoplasm of meninges, unspecified: Secondary | ICD-10-CM | POA: Diagnosis not present

## 2019-06-18 DIAGNOSIS — E43 Unspecified severe protein-calorie malnutrition: Secondary | ICD-10-CM | POA: Diagnosis not present

## 2019-06-18 DIAGNOSIS — R29810 Facial weakness: Secondary | ICD-10-CM | POA: Diagnosis not present

## 2019-06-18 DIAGNOSIS — M4802 Spinal stenosis, cervical region: Secondary | ICD-10-CM | POA: Diagnosis not present

## 2019-06-18 DIAGNOSIS — R4781 Slurred speech: Secondary | ICD-10-CM | POA: Diagnosis not present

## 2019-06-18 DIAGNOSIS — E872 Acidosis: Secondary | ICD-10-CM | POA: Diagnosis not present

## 2019-06-18 DIAGNOSIS — D630 Anemia in neoplastic disease: Secondary | ICD-10-CM | POA: Diagnosis not present

## 2019-06-18 DIAGNOSIS — E871 Hypo-osmolality and hyponatremia: Secondary | ICD-10-CM | POA: Diagnosis not present

## 2019-06-18 DIAGNOSIS — M47812 Spondylosis without myelopathy or radiculopathy, cervical region: Secondary | ICD-10-CM | POA: Diagnosis not present

## 2019-06-18 DIAGNOSIS — I1310 Hypertensive heart and chronic kidney disease without heart failure, with stage 1 through stage 4 chronic kidney disease, or unspecified chronic kidney disease: Secondary | ICD-10-CM | POA: Diagnosis not present

## 2019-06-18 DIAGNOSIS — N179 Acute kidney failure, unspecified: Secondary | ICD-10-CM | POA: Diagnosis not present

## 2019-06-18 DIAGNOSIS — E1122 Type 2 diabetes mellitus with diabetic chronic kidney disease: Secondary | ICD-10-CM | POA: Diagnosis not present

## 2019-06-18 DIAGNOSIS — N189 Chronic kidney disease, unspecified: Secondary | ICD-10-CM | POA: Diagnosis not present

## 2019-06-18 DIAGNOSIS — G319 Degenerative disease of nervous system, unspecified: Secondary | ICD-10-CM | POA: Diagnosis not present

## 2019-06-19 ENCOUNTER — Encounter (HOSPITAL_COMMUNITY): Payer: Self-pay | Admitting: Hematology

## 2019-06-19 ENCOUNTER — Ambulatory Visit
Admission: RE | Admit: 2019-06-19 | Discharge: 2019-06-19 | Disposition: A | Discharge status: Home / Self Care | Payer: Medicare Other | Source: Ambulatory Visit | Attending: Radiation Oncology | Admitting: Radiation Oncology

## 2019-06-19 ENCOUNTER — Other Ambulatory Visit: Payer: Self-pay

## 2019-06-19 DIAGNOSIS — C241 Malignant neoplasm of ampulla of Vater: Secondary | ICD-10-CM | POA: Diagnosis not present

## 2019-06-22 ENCOUNTER — Ambulatory Visit
Admission: RE | Admit: 2019-06-22 | Discharge: 2019-06-22 | Disposition: A | Discharge status: Home / Self Care | Payer: Medicare Other | Source: Ambulatory Visit | Attending: Radiation Oncology | Admitting: Radiation Oncology

## 2019-06-22 ENCOUNTER — Other Ambulatory Visit: Payer: Self-pay

## 2019-06-22 DIAGNOSIS — C241 Malignant neoplasm of ampulla of Vater: Secondary | ICD-10-CM | POA: Diagnosis not present

## 2019-06-23 ENCOUNTER — Ambulatory Visit: Payer: Medicare Other | Admitting: Radiation Oncology

## 2019-06-23 DIAGNOSIS — D329 Benign neoplasm of meninges, unspecified: Secondary | ICD-10-CM | POA: Diagnosis not present

## 2019-06-23 DIAGNOSIS — N179 Acute kidney failure, unspecified: Secondary | ICD-10-CM | POA: Diagnosis not present

## 2019-06-23 DIAGNOSIS — E785 Hyperlipidemia, unspecified: Secondary | ICD-10-CM | POA: Diagnosis not present

## 2019-06-23 DIAGNOSIS — I080 Rheumatic disorders of both mitral and aortic valves: Secondary | ICD-10-CM | POA: Diagnosis not present

## 2019-06-23 DIAGNOSIS — M47812 Spondylosis without myelopathy or radiculopathy, cervical region: Secondary | ICD-10-CM | POA: Diagnosis not present

## 2019-06-23 DIAGNOSIS — R29810 Facial weakness: Secondary | ICD-10-CM | POA: Diagnosis not present

## 2019-06-23 DIAGNOSIS — I700 Atherosclerosis of aorta: Secondary | ICD-10-CM | POA: Diagnosis not present

## 2019-06-23 DIAGNOSIS — G319 Degenerative disease of nervous system, unspecified: Secondary | ICD-10-CM | POA: Diagnosis not present

## 2019-06-23 DIAGNOSIS — E43 Unspecified severe protein-calorie malnutrition: Secondary | ICD-10-CM | POA: Diagnosis not present

## 2019-06-23 DIAGNOSIS — M4802 Spinal stenosis, cervical region: Secondary | ICD-10-CM | POA: Diagnosis not present

## 2019-06-23 DIAGNOSIS — I1310 Hypertensive heart and chronic kidney disease without heart failure, with stage 1 through stage 4 chronic kidney disease, or unspecified chronic kidney disease: Secondary | ICD-10-CM | POA: Diagnosis not present

## 2019-06-23 DIAGNOSIS — K8031 Calculus of bile duct with cholangitis, unspecified, with obstruction: Secondary | ICD-10-CM | POA: Diagnosis not present

## 2019-06-23 DIAGNOSIS — D709 Neutropenia, unspecified: Secondary | ICD-10-CM | POA: Diagnosis not present

## 2019-06-23 DIAGNOSIS — K851 Biliary acute pancreatitis without necrosis or infection: Secondary | ICD-10-CM | POA: Diagnosis not present

## 2019-06-23 DIAGNOSIS — Z6820 Body mass index (BMI) 20.0-20.9, adult: Secondary | ICD-10-CM | POA: Diagnosis not present

## 2019-06-23 DIAGNOSIS — E871 Hypo-osmolality and hyponatremia: Secondary | ICD-10-CM | POA: Diagnosis not present

## 2019-06-23 DIAGNOSIS — E872 Acidosis: Secondary | ICD-10-CM | POA: Diagnosis not present

## 2019-06-23 DIAGNOSIS — N189 Chronic kidney disease, unspecified: Secondary | ICD-10-CM | POA: Diagnosis not present

## 2019-06-23 DIAGNOSIS — E1122 Type 2 diabetes mellitus with diabetic chronic kidney disease: Secondary | ICD-10-CM | POA: Diagnosis not present

## 2019-06-23 DIAGNOSIS — D631 Anemia in chronic kidney disease: Secondary | ICD-10-CM | POA: Diagnosis not present

## 2019-06-23 DIAGNOSIS — I2510 Atherosclerotic heart disease of native coronary artery without angina pectoris: Secondary | ICD-10-CM | POA: Diagnosis not present

## 2019-06-23 DIAGNOSIS — R4781 Slurred speech: Secondary | ICD-10-CM | POA: Diagnosis not present

## 2019-06-23 DIAGNOSIS — D630 Anemia in neoplastic disease: Secondary | ICD-10-CM | POA: Diagnosis not present

## 2019-06-24 ENCOUNTER — Other Ambulatory Visit: Payer: Self-pay

## 2019-06-24 ENCOUNTER — Ambulatory Visit
Admission: RE | Admit: 2019-06-24 | Discharge: 2019-06-24 | Disposition: A | Discharge status: Home / Self Care | Payer: Medicare Other | Source: Ambulatory Visit | Attending: Radiation Oncology | Admitting: Radiation Oncology

## 2019-06-24 DIAGNOSIS — C241 Malignant neoplasm of ampulla of Vater: Secondary | ICD-10-CM | POA: Diagnosis not present

## 2019-06-25 DIAGNOSIS — R29810 Facial weakness: Secondary | ICD-10-CM | POA: Diagnosis not present

## 2019-06-25 DIAGNOSIS — D630 Anemia in neoplastic disease: Secondary | ICD-10-CM | POA: Diagnosis not present

## 2019-06-25 DIAGNOSIS — E871 Hypo-osmolality and hyponatremia: Secondary | ICD-10-CM | POA: Diagnosis not present

## 2019-06-25 DIAGNOSIS — K8031 Calculus of bile duct with cholangitis, unspecified, with obstruction: Secondary | ICD-10-CM | POA: Diagnosis not present

## 2019-06-25 DIAGNOSIS — E872 Acidosis: Secondary | ICD-10-CM | POA: Diagnosis not present

## 2019-06-25 DIAGNOSIS — G319 Degenerative disease of nervous system, unspecified: Secondary | ICD-10-CM | POA: Diagnosis not present

## 2019-06-25 DIAGNOSIS — I1310 Hypertensive heart and chronic kidney disease without heart failure, with stage 1 through stage 4 chronic kidney disease, or unspecified chronic kidney disease: Secondary | ICD-10-CM | POA: Diagnosis not present

## 2019-06-25 DIAGNOSIS — D709 Neutropenia, unspecified: Secondary | ICD-10-CM | POA: Diagnosis not present

## 2019-06-25 DIAGNOSIS — M47812 Spondylosis without myelopathy or radiculopathy, cervical region: Secondary | ICD-10-CM | POA: Diagnosis not present

## 2019-06-25 DIAGNOSIS — D631 Anemia in chronic kidney disease: Secondary | ICD-10-CM | POA: Diagnosis not present

## 2019-06-25 DIAGNOSIS — K851 Biliary acute pancreatitis without necrosis or infection: Secondary | ICD-10-CM | POA: Diagnosis not present

## 2019-06-25 DIAGNOSIS — I700 Atherosclerosis of aorta: Secondary | ICD-10-CM | POA: Diagnosis not present

## 2019-06-25 DIAGNOSIS — D329 Benign neoplasm of meninges, unspecified: Secondary | ICD-10-CM | POA: Diagnosis not present

## 2019-06-25 DIAGNOSIS — N189 Chronic kidney disease, unspecified: Secondary | ICD-10-CM | POA: Diagnosis not present

## 2019-06-25 DIAGNOSIS — E1122 Type 2 diabetes mellitus with diabetic chronic kidney disease: Secondary | ICD-10-CM | POA: Diagnosis not present

## 2019-06-25 DIAGNOSIS — Z6820 Body mass index (BMI) 20.0-20.9, adult: Secondary | ICD-10-CM | POA: Diagnosis not present

## 2019-06-25 DIAGNOSIS — N179 Acute kidney failure, unspecified: Secondary | ICD-10-CM | POA: Diagnosis not present

## 2019-06-25 DIAGNOSIS — M4802 Spinal stenosis, cervical region: Secondary | ICD-10-CM | POA: Diagnosis not present

## 2019-06-25 DIAGNOSIS — E785 Hyperlipidemia, unspecified: Secondary | ICD-10-CM | POA: Diagnosis not present

## 2019-06-25 DIAGNOSIS — E43 Unspecified severe protein-calorie malnutrition: Secondary | ICD-10-CM | POA: Diagnosis not present

## 2019-06-25 DIAGNOSIS — R4781 Slurred speech: Secondary | ICD-10-CM | POA: Diagnosis not present

## 2019-06-25 DIAGNOSIS — I080 Rheumatic disorders of both mitral and aortic valves: Secondary | ICD-10-CM | POA: Diagnosis not present

## 2019-06-25 DIAGNOSIS — I2510 Atherosclerotic heart disease of native coronary artery without angina pectoris: Secondary | ICD-10-CM | POA: Diagnosis not present

## 2019-06-26 ENCOUNTER — Other Ambulatory Visit: Payer: Self-pay

## 2019-06-26 ENCOUNTER — Ambulatory Visit
Admission: RE | Admit: 2019-06-26 | Discharge: 2019-06-26 | Disposition: A | Discharge status: Home / Self Care | Payer: Medicare Other | Source: Ambulatory Visit | Attending: Radiation Oncology | Admitting: Radiation Oncology

## 2019-06-26 ENCOUNTER — Encounter: Payer: Self-pay | Admitting: Radiation Oncology

## 2019-06-26 DIAGNOSIS — C241 Malignant neoplasm of ampulla of Vater: Secondary | ICD-10-CM | POA: Diagnosis not present

## 2019-06-29 NOTE — Progress Notes (Signed)
[place]   Telephone:(336) [phone] [phone]   Clinic Follow up Note   Patient Care Team: Asencion Noble, MD as PCP - General (Internal Medicine) Gala Romney, Cristopher Estimable, MD as Consulting Physician (Gastroenterology) Jonnie Finner, RN as Oncology Nurse Navigator Truitt Merle, MD as Consulting Physician (Hematology) Heilingoetter, Tobe Sos, PA-C as Physician Assistant (Physician Assistant)  Date of Service:  06/30/2019  CHIEF COMPLAINT: F/u of Ampullary carcinoma  SUMMARY OF ONCOLOGIC HISTORY: Oncology History Overview Note  Cancer Staging Ampullary carcinoma [facility]) Staging form: Small Intestine - Adenocarcinoma, AJCC 8th Edition - Clinical stage from 05/20/2019: Stage Unknown (cTX, cN0, cM0) - Signed by Truitt Merle, MD on 06/30/2019    Ampullary carcinoma ([place])  03/20/2019 Imaging   1. Dilated gallbladder with cholelithiasis and mild wall thickening. This is suspicious for cholecystitis. Recommend clinical correlation, and consider nuclear medicine hepatobiliary scan for further evaluation. 2. Diffuse biliary ductal dilatation, with common bile duct measuring 14 mm in diameter. Recommend correlation with liver function tests, and consider MRCP or ERCP for further evaluation.   05/18/2019 Imaging   Profound intra and extrahepatic biliary ductal dilatation in addition to dilatation of the pancreatic duct, slightly increased compared to prior examination dated 03/20/2019 and highly concerning for a small ampullary or pancreatic malignancy which is not directly visualized.   05/20/2019 Initial Diagnosis   Ampullary carcinoma ([place])   05/20/2019 Procedure   ERCP under the care of Dr. 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 This was biopsied extensively. - The mass causes a short, abrupt distal CBD stricture which was stented with a 4cm long 2m diameter uncovered metal mesh stent.   05/20/2019 Pathology  Results   SURGICAL PATHOLOGY [id number]   A. AMPULLARY, BIOPSY: - Poorly differentiated adenocarcinoma   05/20/2019 Cancer Staging   Staging form: Small Intestine - Adenocarcinoma, AJCC 8th Edition - Clinical stage from 05/20/2019: Stage Unknown (cTX, cN0, cM0) - Signed by FTruitt Merle MD on 06/30/2019   06/03/2019 Imaging   CT Chest wo Contrast  IMPRESSION: 1. No evidence of metastatic disease within the chest, study mildly limited by respiratory motion at the lung bases. 2. Dilated central pulmonary vasculature up to 3.6 cm, likely reflects pulmonary arterial hypertension 3. Three-vessel coronary artery calcification. 4. Aortic atherosclerosis.   Aortic Atherosclerosis (ICD10-I70.0).   06/17/2019 - 06/26/2019 Radiation Therapy   SBRT with Dr MLisbeth Renshaw6/2/21-6/11/21.       CURRENT THERAPY:  Observation  INTERVAL HISTORY:  Debra DROZDOWSKIis here for a follow up. She presents to the clinic with her daughter in person and her son on the phone. She completed RT and tolerated well. She denies any issues. She was given Prilosec by her Radiation Oncologist. She has lost 7 pounds. She notes her appetite is low and she is eating very little. She has tried Mirtazapine at night but made her delusional and disoriented. She stopped the medication. She is doing PT currently. Her neurologist plans to do more work up based on recent MRIs.  She has been taking aspirin daily.    REVIEW OF SYSTEMS:   Constitutional: Denies fevers, chills or abnormal weight loss Eyes: Denies blurriness of vision Ears, nose, mouth, throat, and face: Denies mucositis or sore throat Respiratory: Denies cough, dyspnea or wheezes Cardiovascular: Denies palpitation, chest discomfort or lower extremity swelling Gastrointestinal:  Denies nausea, heartburn or change in bowel habits Skin: Denies abnormal skin rashes Lymphatics: Denies new lymphadenopathy or easy bruising Neurological:Denies  numbness, tingling or new  weaknesses Behavioral/Psych: Mood is stable, no new changes  All other systems were reviewed with the patient and are negative.  MEDICAL HISTORY:  Past Medical History:  Diagnosis Date  . CKD (chronic kidney disease)   . Diabetes mellitus without complication (HCC)   . Gout   . Hypercholesteremia   . Hypertension   . 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 BILIARY STENT PLACEMENT  05/20/2019   Procedure: BILIARY STENT PLACEMENT;  Surgeon: Rachael Fee, MD;  Location: [facility] ENDOSCOPY;  Service: Endoscopy;;  . BIOPSY  05/20/2019   Procedure: BIOPSY;  Surgeon: Rachael Fee, MD;  Location: [facility] ENDOSCOPY;  Service: Endoscopy;;  . ERCP N/A 05/20/2019   Procedure: ENDOSCOPIC RETROGRADE CHOLANGIOPANCREATOGRAPHY (ERCP);  Surgeon: Rachael Fee, MD;  Location: [facility] ENDOSCOPY;  Service: Endoscopy;  Laterality: N/A;  . TOTAL KNEE ARTHROPLASTY Left     I have reviewed the social history and family history with the patient and they are unchanged from previous note.  ALLERGIES:  is allergic to other.  MEDICATIONS:  Current Outpatient Medications  Medication Sig Dispense Refill  . ACCU-CHEK GUIDE test strip 2 (two) times daily. use for testing    . Accu-Chek Softclix Lancets lancets USE TWICE DAILY with strips    . aspirin EC 81 MG tablet Take 81 mg by mouth daily.    . Blood Glucose Monitoring Suppl (ACCU-CHEK GUIDE) w/Device KIT USE AS DIRECTED TO test TWICE DAILY    . hydrochlorothiazide (HYDRODIURIL) 25 MG tablet Take 25 mg by mouth daily.    . Insulin Infusion Pump (ACCU-CHEK COMBO) KIT 1 application by Does not apply route 2 (two) times daily. 1 kit 0  . latanoprost (XALATAN) 0.005 % ophthalmic solution Place 1 drop into both eyes at bedtime.     . megestrol (MEGACE ES) 625 MG/5ML suspension Take 5 mLs (625 mg total) by mouth daily. 150 mL 0  . metFORMIN (GLUCOPHAGE-XR) 500 MG 24 hr tablet Take 500 mg by  mouth 2 (two) times daily.    . mirtazapine (REMERON) 7.5 MG tablet Take 1 tablet (7.5 mg total) by mouth at bedtime. 30 tablet 2  . senna (SENOKOT) 8.6 MG TABS tablet Take 1 tablet (8.6 mg total) by mouth at bedtime as needed for mild constipation. 120 tablet 0  . triamcinolone (KENALOG) 0.025 % cream Apply 1 application topically 2 (two) times daily as needed.     No current facility-administered medications for this visit.    PHYSICAL EXAMINATION: ECOG PERFORMANCE STATUS: 2 - Symptomatic, <50% confined to bed  Vitals:   06/30/19 1019  BP: 121/74  Pulse: 86  Resp: 14  Temp: (!) 97.5 F (36.4 C)  SpO2: 100%   Filed Weights   06/30/19 1019  Weight: 137 lb 8 oz (62.4 kg)    Due to COVID19 we will limit examination to appearance. Patient had no complaints.  GENERAL:alert, no distress and comfortable SKIN: skin color normal, no rashes or significant lesions EYES: normal, Conjunctiva are pink and non-injected, sclera clear  NEURO: alert & oriented x 3 with fluent speech    LABORATORY DATA:  I have reviewed the data as listed CBC Latest Ref Rng & Units 06/30/2019 05/22/2019 05/21/2019  WBC 4.0 - 10.5 K/uL 4.7 9.4 -  Hemoglobin 12.0 - 15.0 g/dL 10.9(L) 8.2(L) 8.0(L)  Hematocrit 36 - 46 % 33.3(L) 24.5(L) 23.7(L)  Platelets 150 - 400  K/uL 296 456(H) -     CMP Latest Ref Rng & Units 06/30/2019 06/10/2019 06/08/2019  Glucose 70 - 99 mg/dL 87 - 126(H)  BUN 8 - 23 mg/dL '8 11 9  '$ Creatinine 0.44 - 1.00 mg/dL 0.76 0.86 0.82  Sodium 135 - 145 mmol/L 140 - 140  Potassium 3.5 - 5.1 mmol/L 4.0 - 4.0  Chloride 98 - 111 mmol/L 105 - 106  CO2 22 - 32 mmol/L 24 - 21(L)  Calcium 8.9 - 10.3 mg/dL 8.9 - 8.6(L)  Total Protein 6.5 - 8.1 g/dL 6.3(L) - 6.3(L)  Total Bilirubin 0.3 - 1.2 mg/dL 1.1 - 1.7(H)  Alkaline Phos 38 - 126 U/L 99 - 171(H)  AST 15 - 41 U/L 20 - 21  ALT 0 - 44 U/L 11 - 18      RADIOGRAPHIC STUDIES: I have personally reviewed the radiological images as listed and agreed with  the findings in the report. No results found.   ASSESSMENT & PLAN:  Debra [name] is a 86 y.o. female with    1. Ampullary adenocarcinoma, cTxN0M0 -Patient diagnosed in 05/2019 with painless obstructive jaundice, biliary dilation, and elevated LFTs. -CT AP from 05/18/2019 noted profound intra and extrahepatic biliary ductal dilatation in addition to dilatation of the pancreatic duct, slightly increased compared to prior examination dated 03/20/2019 and highly concerning for a small ampullary or pancreatic malignancy which is not directly visualized. -ERCP performed by Dr. Ardis Hughs on 05/20/2019 noted ulcerated 2-3cm ampullary mass. The mass causes a short, abrupt distal CBD stricture which was stented with a 4cm long 19m diameter uncovered metal mesh stent. Her Pathology was consistent with ampullary adenocarcinoma.  -Baseline CEA 7.5 05/19/19, CA 19.9 <2 05/19/19, staging scan was negative for node or distant metastasis. -Patient is not a candidate for surgery or chemotherapy due to her advanced age and multiple comorbidities. -To provide some disease control she completed SBRT with Dr MLisbeth Renshaw6/2/21-6/11/21. She tolerated very well. I reviewed RT was not curative , but hopefully will control her disease. -I discussed proceeding with observation now. I will scan her only if she develops symptoms concerning for disease progression.  -If she becomes more symptomatic I can refer her to palliative or hospice home care to help manage her symptoms.  -I reviewed medication list with her, she is fine to stop aspirin which can lead to bleeding from tumor. -Repeat labs today, f/u in 4 weeks    2. Weight loss/insomnia/deconditioning -Patient has reportedly lost 80 pounds over this past year along with early satiety, decreased appetite, and weight loss. The patient is also reporting insomnia/restlessness. -Patient recently started nutritional supplementation with Glucerna. Encouraged to continue. -The patient  is unable to stand up or sit down without assistance. She spends most of the day in the recliner. She lives at home with her 2 sons and has home health aide 5 days a week.  She is currently undergoing physical therapy due to her deconditioning.  Recommend that she continue physical therapy. -Due to low appetite she has had more weight loss.  -I encouraged her to increase Glucerna to 2-3 daily.  -I previously called in Mirtazapine 7.5 mg, but did not tolerate due to delusions and disorientation. I will call in Megace to try (06/30/19). I reviewed side effects of blood clots so I advised her to be active at home.  -I will refer her to dietician to help her maintain weight.   3. CKD -probably secondary to DM -stable  4. Anemia -Previous anemia  work-up showed high ferritin, low serum iron and TIBC, consistent with anemia of, secondary to underlying malignancy. -I encouraged her to take multivitamins, will repeat labs today. -We will consider blood transfusion as needed.  Plan: -Lab today  -Stop daily aspirin  -I called in Megace for poor appetite -Continue observation and supportive care. -Lab and f/u in 4 weeks    No problem-specific Assessment & Plan notes found for this encounter.   Orders Placed This Encounter  Procedures  . CBC with Differential (Cancer Center Only)    Standing Status:   Standing    Number of Occurrences:   20    Standing Expiration Date:   06/29/2020  . CMP ([place] only)    Standing Status:   Standing    Number of Occurrences:   20    Standing Expiration Date:   06/29/2020  . Ferritin    Standing Status:   Standing    Number of Occurrences:   20    Standing Expiration Date:   06/29/2020  . Iron and TIBC    Standing Status:   Standing    Number of Occurrences:   20    Standing Expiration Date:   06/29/2020  . CEA (IN HOUSE-[facility])    Standing Status:   Standing    Number of Occurrences:   20    Standing Expiration Date:   06/29/2020   All questions  were answered. The patient knows to call the clinic with any problems, questions or concerns. No barriers to learning was detected. The total time spent in the appointment was 30 minutes.     Truitt Merle, MD 06/30/2019   I, Joslyn Devon, am acting as scribe for Truitt Merle, MD.   I have reviewed the above documentation for accuracy and completeness, and I agree with the above.

## 2019-06-30 ENCOUNTER — Inpatient Hospital Stay: Payer: Medicare Other

## 2019-06-30 ENCOUNTER — Encounter: Payer: Self-pay | Admitting: Hematology

## 2019-06-30 ENCOUNTER — Other Ambulatory Visit: Payer: Self-pay

## 2019-06-30 ENCOUNTER — Inpatient Hospital Stay: Payer: Medicare Other | Attending: Physician Assistant | Admitting: Hematology

## 2019-06-30 ENCOUNTER — Ambulatory Visit (INDEPENDENT_AMBULATORY_CARE_PROVIDER_SITE_OTHER): Payer: Medicare Other | Admitting: Neurology

## 2019-06-30 ENCOUNTER — Encounter: Payer: Self-pay | Admitting: Neurology

## 2019-06-30 VITALS — BP 121/74 | HR 86 | Temp 97.5°F | Resp 14 | Wt 137.5 lb

## 2019-06-30 VITALS — BP 105/60 | HR 76 | Wt 137.0 lb

## 2019-06-30 DIAGNOSIS — G301 Alzheimer's disease with late onset: Secondary | ICD-10-CM | POA: Diagnosis not present

## 2019-06-30 DIAGNOSIS — C241 Malignant neoplasm of ampulla of Vater: Secondary | ICD-10-CM

## 2019-06-30 DIAGNOSIS — G459 Transient cerebral ischemic attack, unspecified: Secondary | ICD-10-CM

## 2019-06-30 DIAGNOSIS — E119 Type 2 diabetes mellitus without complications: Secondary | ICD-10-CM | POA: Insufficient documentation

## 2019-06-30 DIAGNOSIS — N189 Chronic kidney disease, unspecified: Secondary | ICD-10-CM | POA: Diagnosis not present

## 2019-06-30 DIAGNOSIS — D329 Benign neoplasm of meninges, unspecified: Secondary | ICD-10-CM

## 2019-06-30 DIAGNOSIS — Z923 Personal history of irradiation: Secondary | ICD-10-CM | POA: Insufficient documentation

## 2019-06-30 DIAGNOSIS — F0280 Dementia in other diseases classified elsewhere without behavioral disturbance: Secondary | ICD-10-CM | POA: Diagnosis not present

## 2019-06-30 DIAGNOSIS — R634 Abnormal weight loss: Secondary | ICD-10-CM | POA: Diagnosis not present

## 2019-06-30 DIAGNOSIS — G4700 Insomnia, unspecified: Secondary | ICD-10-CM | POA: Insufficient documentation

## 2019-06-30 DIAGNOSIS — Z79899 Other long term (current) drug therapy: Secondary | ICD-10-CM | POA: Insufficient documentation

## 2019-06-30 DIAGNOSIS — D630 Anemia in neoplastic disease: Secondary | ICD-10-CM | POA: Insufficient documentation

## 2019-06-30 LAB — CMP (CANCER CENTER ONLY)
ALT: 11 U/L (ref 0–44)
AST: 20 U/L (ref 15–41)
Albumin: 2.5 g/dL — ABNORMAL LOW (ref 3.5–5.0)
Alkaline Phosphatase: 99 U/L (ref 38–126)
Anion gap: 11 (ref 5–15)
BUN: 8 mg/dL (ref 8–23)
CO2: 24 mmol/L (ref 22–32)
Calcium: 8.9 mg/dL (ref 8.9–10.3)
Chloride: 105 mmol/L (ref 98–111)
Creatinine: 0.76 mg/dL (ref 0.44–1.00)
GFR, Est AFR Am: >60 mL/min (ref >60)
GFR, Estimated: >60 mL/min (ref >60)
Glucose, Bld: 87 mg/dL (ref 70–99)
Potassium: 4 mmol/L (ref 3.5–5.1)
Sodium: 140 mmol/L (ref 135–145)
Total Bilirubin: 1.1 mg/dL (ref 0.3–1.2)
Total Protein: 6.3 g/dL — ABNORMAL LOW (ref 6.5–8.1)

## 2019-06-30 LAB — CBC WITH DIFFERENTIAL (CANCER CENTER ONLY)
Abs Immature Granulocytes: 0.01 10*3/uL (ref 0.00–0.07)
Basophils Absolute: 0 10*3/uL (ref 0.0–0.1)
Basophils Relative: 1 %
Eosinophils Absolute: 0.2 10*3/uL (ref 0.0–0.5)
Eosinophils Relative: 3 %
HCT: 33.3 % — ABNORMAL LOW (ref 36.0–46.0)
Hemoglobin: 10.9 g/dL — ABNORMAL LOW (ref 12.0–15.0)
Immature Granulocytes: 0 %
Lymphocytes Relative: 49 %
Lymphs Abs: 2.3 10*3/uL (ref 0.7–4.0)
MCH: 31 pg (ref 26.0–34.0)
MCHC: 32.7 g/dL (ref 30.0–36.0)
MCV: 94.6 fL (ref 80.0–100.0)
Monocytes Absolute: 0.5 10*3/uL (ref 0.1–1.0)
Monocytes Relative: 10 %
Neutro Abs: 1.7 10*3/uL (ref 1.7–7.7)
Neutrophils Relative %: 37 %
Platelet Count: 296 10*3/uL (ref 150–400)
RBC: 3.52 MIL/uL — ABNORMAL LOW (ref 3.87–5.11)
RDW: 14.3 % (ref 11.5–15.5)
WBC Count: 4.7 10*3/uL (ref 4.0–10.5)
nRBC: 0 % (ref 0.0–0.2)

## 2019-06-30 LAB — IRON AND TIBC
Iron: 57 ug/dL (ref 28–170)
Saturation Ratios: 28 % (ref 10.4–31.8)
TIBC: 205 ug/dL — ABNORMAL LOW (ref 250–450)
UIBC: 148 ug/dL

## 2019-06-30 LAB — FERRITIN: Ferritin: 83 ng/mL (ref 11–307)

## 2019-06-30 MED ORDER — MEGESTROL ACETATE 625 MG/5ML PO SUSP
625.0000 mg | Freq: Every day | ORAL | 0 refills | Status: DC
Start: 2019-06-30 — End: 2019-09-28

## 2019-06-30 NOTE — Patient Instructions (Addendum)
I had a long d/w patient and her daughter about her recent TIA, mild dementia, small meningioma  risk for recurrent stroke/TIAs, personally independently reviewed imaging studies and stroke evaluation results and answered questions.Continue aspirin 81 mg daily  for secondary stroke prevention and maintain strict control of hypertension with blood pressure goal below 130/90, diabetes with hemoglobin A1c goal below 6.5% and lipids with LDL cholesterol goal below 70 mg/dL. I also advised the patient to eat a healthy diet with plenty of whole grains, cereals, fruits and vegetables, exercise regularly and maintain ideal body weight. Check EEG for seizures.Continue f/u with oncologist for treatment of pancreatic cancer. Followup in the future with me in 2 months or call earlier of necessary  Stroke Prevention Some medical conditions and behaviors are associated with a higher chance of having a stroke. You can help prevent a stroke by making nutrition, lifestyle, and other changes, including managing any medical conditions you may have. What nutrition changes can be made?   Eat healthy foods. You can do this by: ? Choosing foods high in fiber, such as fresh fruits and vegetables and whole grains. ? Eating at least 5 or more servings of fruits and vegetables a day. Try to fill half of your plate at each meal with fruits and vegetables. ? Choosing lean protein foods, such as lean cuts of meat, poultry without skin, fish, tofu, beans, and nuts. ? Eating low-fat dairy products. ? Avoiding foods that are high in salt (sodium). This can help lower blood pressure. ? Avoiding foods that have saturated fat, trans fat, and cholesterol. This can help prevent high cholesterol. ? Avoiding processed and premade foods.  Follow your health care provider's specific guidelines for losing weight, controlling high blood pressure (hypertension), lowering high cholesterol, and managing diabetes. These may include: ? Reducing  your daily calorie intake. ? Limiting your daily sodium intake to 1,500 milligrams (mg). ? Using only healthy fats for cooking, such as olive oil, canola oil, or sunflower oil. ? Counting your daily carbohydrate intake. What lifestyle changes can be made?  Maintain a healthy weight. Talk to your health care provider about your ideal weight.  Get at least 30 minutes of moderate physical activity at least 5 days a week. Moderate activity includes brisk walking, biking, and swimming.  Do not use any products that contain nicotine or tobacco, such as cigarettes and e-cigarettes. If you need help quitting, ask your health care provider. It may also be helpful to avoid exposure to secondhand smoke.  Limit alcohol intake to no more than 1 drink a day for nonpregnant women and 2 drinks a day for men. One drink equals 12 oz of beer, 5 oz of wine, or 1 oz of hard liquor.  Stop any illegal drug use.  Avoid taking birth control pills. Talk to your health care provider about the risks of taking birth control pills if: ? You are over 43 years old. ? You smoke. ? You get migraines. ? You have ever had a blood clot. What other changes can be made?  Manage your cholesterol levels. ? Eating a healthy diet is important for preventing high cholesterol. If cholesterol cannot be managed through diet alone, you may also need to take medicines. ? Take any prescribed medicines to control your cholesterol as told by your health care provider.  Manage your diabetes. ? Eating a healthy diet and exercising regularly are important parts of managing your blood sugar. If your blood sugar cannot be managed through diet and  exercise, you may need to take medicines. ? Take any prescribed medicines to control your diabetes as told by your health care provider.  Control your hypertension. ? To reduce your risk of stroke, try to keep your blood pressure below 130/80. ? Eating a healthy diet and exercising regularly are  an important part of controlling your blood pressure. If your blood pressure cannot be managed through diet and exercise, you may need to take medicines. ? Take any prescribed medicines to control hypertension as told by your health care provider. ? Ask your health care provider if you should monitor your blood pressure at home. ? Have your blood pressure checked every year, even if your blood pressure is normal. Blood pressure increases with age and some medical conditions.  Get evaluated for sleep disorders (sleep apnea). Talk to your health care provider about getting a sleep evaluation if you snore a lot or have excessive sleepiness.  Take over-the-counter and prescription medicines only as told by your health care provider. Aspirin or blood thinners (antiplatelets or anticoagulants) may be recommended to reduce your risk of forming blood clots that can lead to stroke.  Make sure that any other medical conditions you have, such as atrial fibrillation or atherosclerosis, are managed. What are the warning signs of a stroke? The warning signs of a stroke can be easily remembered as BEFAST.  B is for balance. Signs include: ? Dizziness. ? Loss of balance or coordination. ? Sudden trouble walking.  E is for eyes. Signs include: ? A sudden change in vision. ? Trouble seeing.  F is for face. Signs include: ? Sudden weakness or numbness of the face. ? The face or eyelid drooping to one side.  A is for arms. Signs include: ? Sudden weakness or numbness of the arm, usually on one side of the body.  S is for speech. Signs include: ? Trouble speaking (aphasia). ? Trouble understanding.  T is for time. ? These symptoms may represent a serious problem that is an emergency. Do not wait to see if the symptoms will go away. Get medical help right away. Call your local emergency services (911 in the [place] Do not drive yourself to the hospital.  Other signs of stroke may include: ? A sudden,  severe headache with no known cause. ? Nausea or vomiting. ? Seizure. Where to find more information For more information, visit:  American Stroke Association: www.strokeassociation.org  National Stroke Association: www.stroke.org Summary  You can prevent a stroke by eating healthy, exercising, not smoking, limiting alcohol intake, and managing any medical conditions you may have.  Do not use any products that contain nicotine or tobacco, such as cigarettes and e-cigarettes. If you need help quitting, ask your health care provider. It may also be helpful to avoid exposure to secondhand smoke.  Remember BEFAST for warning signs of stroke. Get help right away if you or a loved one has any of these signs. This information is not intended to replace advice given to you by your health care provider. Make sure you discuss any questions you have with your health care provider. Document Revised: 12/14/2016 Document Reviewed: 02/07/2016 Elsevier Patient Education  2020 [employer].

## 2019-07-01 ENCOUNTER — Telehealth: Payer: Self-pay | Admitting: Hematology

## 2019-07-01 DIAGNOSIS — C241 Malignant neoplasm of ampulla of Vater: Secondary | ICD-10-CM | POA: Diagnosis not present

## 2019-07-01 DIAGNOSIS — E440 Moderate protein-calorie malnutrition: Secondary | ICD-10-CM | POA: Diagnosis not present

## 2019-07-01 LAB — CEA (IN HOUSE-CHCC): CEA (CHCC-In House): 4.24 ng/mL (ref 0.00–5.00)

## 2019-07-01 NOTE — Progress Notes (Signed)
[facility] [address] [place]. [place] [address] [phone]       OFFICE CONSULT NOTE  Ms. Debra [name] Date of Birth:  July 03, 1930 Medical Record Number:  [id number]   Referring MD: Rosalin Hawking  Reason for Referral: TIA HPI: Debra [name] is a 86 year old African-American lady seen today for initial office consultation visit for TIA.  She is accompanied by her daughter.  History is obtained from them, review of electronic medical records and I personally reviewed imaging films in PACS.  She has a past medical history of hypertension, diabetes, hyperlipidemia who woke up on 05/18/2019 in her usual state of health with subsequently developed sudden onset of left facial droop and slurred speech.  She was brought to the hospital but upon arrival her symptoms resolved within 30 minutes of onset.  She did initially have some right upper extremity weakness and NIH stroke scale of 2 she is not considered to be candidate of TPA because she presented outside the window.  She had been recently diagnosed with pancreatic cancer.  MRI scan of the brain showed no acute stroke and changes of small vessel disease and mild atrophy only.  A small 8 mm left frontal convexity meningioma noted incidentally.  MRI of the head showed no significant acute stenosis MRA of the neck showed mild bifurcation atherosclerosis with less than 50% stenosis.  2D echo showed normal ejection fraction.  Lower extremity venous Dopplers negative for DVT.  LDL could not be calculated due to low HDL but total cholesterol was 263 mg percent and triglycerides 189.  Hemoglobin A1c was 6.1.  Patient was started on aspirin for stroke prevention.  She is not recommended dual antiplatelet therapy due to need for future procedures and possibly anticoagulation for pancreatic cancer.  Patient states she is done well since discharge she has had no recurrent TIA or other stroke symptoms.  She is finished 5 treatments of radiation  and previously had a gallbladder stent through ERCP for biliary pancreatic duct by Dr. Ardis Hughs.  She has not been started on statins due to her cholangitis abnormal liver enzymes.  She does have mild short-term memory difficulties at baseline which are unchanged.  She is living at home with family.  She has no new complaints.  She is tolerating aspirin well without bruising or bleeding.  ROS:   14 system review of systems is positive for slurred speech, facial droop, memory loss, gait difficulties and all other systems negative  PMH:  Past Medical History:  Diagnosis Date  . CKD (chronic kidney disease)   . Diabetes mellitus without complication ([place])   . Gout   . Hypercholesteremia   . Hypertension   . 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 Marital status: Single    Spouse name: Not on file  . Number of children: Not on file  . Years of education: Not on file  . Highest education level: Not on file  Occupational History  . Not on file  Tobacco Use  . Smoking status: Never Smoker  . Smokeless tobacco: Never Used  Substance and Sexual Activity  . Alcohol use: No  . Drug use: No  . Sexual activity: Not on file  Other Topics Concern  . Not on file  Social History Narrative  . Not on file   Social Determinants of Health   Financial Resource Strain:   . Difficulty of Paying Living  Expenses:   Food Insecurity:   . Worried About Programme researcher, broadcasting/film/video in the Last Year:   . Barista in the Last Year:   Transportation Needs:   . Freight forwarder (Medical):   Marland Kitchen Lack of Transportation (Non-Medical):   Physical Activity:   . Days of Exercise per Week:   . Minutes of Exercise per Session:   Stress:   . Feeling of Stress :   Social Connections:   . Frequency of Communication with Friends and Family:   . Frequency of Social Gatherings with Friends and Family:   . Attends Religious Services:   .  Active Member of Clubs or Organizations:   . Attends Banker Meetings:   Marland Kitchen Marital Status:   Intimate Partner Violence:   . Fear of Current or Ex-Partner:   . Emotionally Abused:   Marland Kitchen Physically Abused:   . Sexually Abused:     Medications:   Current Outpatient Medications on File Prior to Visit  Medication Sig Dispense Refill  . ACCU-CHEK GUIDE test strip 2 (two) times daily. use for testing    . Accu-Chek Softclix Lancets lancets USE TWICE DAILY with strips    . aspirin EC 81 MG tablet Take 81 mg by mouth daily.    . Blood Glucose Monitoring Suppl (ACCU-CHEK GUIDE) w/Device KIT USE AS DIRECTED TO test TWICE DAILY    . hydrochlorothiazide (HYDRODIURIL) 25 MG tablet Take 25 mg by mouth daily.    . Insulin Infusion Pump (ACCU-CHEK COMBO) KIT 1 application by Does not apply route 2 (two) times daily. 1 kit 0  . latanoprost (XALATAN) 0.005 % ophthalmic solution Place 1 drop into both eyes at bedtime.     . metFORMIN (GLUCOPHAGE-XR) 500 MG 24 hr tablet Take 500 mg by mouth 2 (two) times daily.    . mirtazapine (REMERON) 7.5 MG tablet Take 1 tablet (7.5 mg total) by mouth at bedtime. 30 tablet 2  . senna (SENOKOT) 8.6 MG TABS tablet Take 1 tablet (8.6 mg total) by mouth at bedtime as needed for mild constipation. 120 tablet 0  . triamcinolone (KENALOG) 0.025 % cream Apply 1 application topically 2 (two) times daily as needed.     No current facility-administered medications on file prior to visit.    Allergies:   Allergies  Allergen Reactions  . Other Nausea And Vomiting    Diet Sodas. Sugar-free jello    Physical Exam General: Frail elderly African-American lady seated, in no evident distress Head: head normocephalic and atraumatic.   Neck: supple with no carotid or supraclavicular bruits Cardiovascular: regular rate and rhythm, no murmurs Musculoskeletal: no deformity Skin:  no rash/petichiae Vascular:  Normal pulses all extremities  Neurologic Exam Mental  Status: Awake and fully alert. Oriented to place and time. Recent and remote memory poor attention span, concentration and fund of knowledge diminished.  Recall 0/3.  Mini-Mental status exam not done mood and affect appropriate.  Speech is slightly hypophonic and delayed but follows simple midline and one-step commands. Cranial Nerves: Fundoscopic exam difficult and limited through undilated pupils. Pupils equal, briskly reactive to light. Extraocular movements full without nystagmus. Visual fields full to confrontation. Hearing diminished bilaterally. Facial sensation intact. Face, tongue, palate moves normally and symmetrically.  Motor: Normal bulk and tone. Normal strength in all tested extremity muscles. Sensory.: intact to touch , pinprick , position and vibratory sensation.  Coordination: Rapid alternating movements normal in all extremities. Finger-to-nose and heel-to-shin performed accurately bilaterally. Gait  and Station: Arises from chair with  difficulty. Stance is stooped gait needs 1 person assist and walks with short steps with imbalance..  Reflexes: 1+ and symmetric. Toes downgoing.   NIHSS  0 Modified Rankin  1   ASSESSMENT: 86 year old African-American lady with transient episode of slurred speech and facial droop in May 2021 likely due to right hemispheric TIA from small vessel disease though hypercoagulability from pancreatic cancer could also contribute..  Vascular risk factors of hypertension, hyperlipidemia, diabetes and age.  She also has mild baseline dementia and a small incidental meningioma on brain imaging which can be managed conservatively.     PLAN: I had a long d/w patient and her daughter about her recent TIA, mild dementia, small meningioma  risk for recurrent stroke/TIAs, personally independently reviewed imaging studies and stroke evaluation results and answered questions.Continue aspirin 81 mg daily  for secondary stroke prevention and maintain strict control of  hypertension with blood pressure goal below 130/90, diabetes with hemoglobin A1c goal below 6.5% and lipids with LDL cholesterol goal below 70 mg/dL. I also advised the patient to eat a healthy diet with plenty of whole grains, cereals, fruits and vegetables, exercise regularly and maintain ideal body weight. Check EEG for seizures.Continue f/u with oncologist for treatment of pancreatic cancer.  Greater than 50% time during this 50-minute consultation visit was spent on counseling and coordination of care about her TIA, meningioma and dementia and answering questions followup in the future with me in 2 months or call earlier of necessary Antony Contras, MD  [facility] [address] [place], [place] [address]  Phone [phone] Fax [phone] Note: This document was prepared with digital dictation and possible smart phrase technology. Any transcriptional errors that result from this process are unintentional.

## 2019-07-01 NOTE — Telephone Encounter (Signed)
Scheduled appt per 6/15 los.  Left a vm of the appt date and time

## 2019-07-03 DIAGNOSIS — M47812 Spondylosis without myelopathy or radiculopathy, cervical region: Secondary | ICD-10-CM | POA: Diagnosis not present

## 2019-07-03 DIAGNOSIS — N189 Chronic kidney disease, unspecified: Secondary | ICD-10-CM | POA: Diagnosis not present

## 2019-07-03 DIAGNOSIS — E43 Unspecified severe protein-calorie malnutrition: Secondary | ICD-10-CM | POA: Diagnosis not present

## 2019-07-03 DIAGNOSIS — R4781 Slurred speech: Secondary | ICD-10-CM | POA: Diagnosis not present

## 2019-07-03 DIAGNOSIS — I1310 Hypertensive heart and chronic kidney disease without heart failure, with stage 1 through stage 4 chronic kidney disease, or unspecified chronic kidney disease: Secondary | ICD-10-CM | POA: Diagnosis not present

## 2019-07-03 DIAGNOSIS — E871 Hypo-osmolality and hyponatremia: Secondary | ICD-10-CM | POA: Diagnosis not present

## 2019-07-03 DIAGNOSIS — K8031 Calculus of bile duct with cholangitis, unspecified, with obstruction: Secondary | ICD-10-CM | POA: Diagnosis not present

## 2019-07-03 DIAGNOSIS — D709 Neutropenia, unspecified: Secondary | ICD-10-CM | POA: Diagnosis not present

## 2019-07-03 DIAGNOSIS — D631 Anemia in chronic kidney disease: Secondary | ICD-10-CM | POA: Diagnosis not present

## 2019-07-03 DIAGNOSIS — G319 Degenerative disease of nervous system, unspecified: Secondary | ICD-10-CM | POA: Diagnosis not present

## 2019-07-03 DIAGNOSIS — K851 Biliary acute pancreatitis without necrosis or infection: Secondary | ICD-10-CM | POA: Diagnosis not present

## 2019-07-03 DIAGNOSIS — E872 Acidosis: Secondary | ICD-10-CM | POA: Diagnosis not present

## 2019-07-03 DIAGNOSIS — Z6820 Body mass index (BMI) 20.0-20.9, adult: Secondary | ICD-10-CM | POA: Diagnosis not present

## 2019-07-03 DIAGNOSIS — I2510 Atherosclerotic heart disease of native coronary artery without angina pectoris: Secondary | ICD-10-CM | POA: Diagnosis not present

## 2019-07-03 DIAGNOSIS — D329 Benign neoplasm of meninges, unspecified: Secondary | ICD-10-CM | POA: Diagnosis not present

## 2019-07-03 DIAGNOSIS — N179 Acute kidney failure, unspecified: Secondary | ICD-10-CM | POA: Diagnosis not present

## 2019-07-03 DIAGNOSIS — E1122 Type 2 diabetes mellitus with diabetic chronic kidney disease: Secondary | ICD-10-CM | POA: Diagnosis not present

## 2019-07-03 DIAGNOSIS — I080 Rheumatic disorders of both mitral and aortic valves: Secondary | ICD-10-CM | POA: Diagnosis not present

## 2019-07-03 DIAGNOSIS — D630 Anemia in neoplastic disease: Secondary | ICD-10-CM | POA: Diagnosis not present

## 2019-07-03 DIAGNOSIS — M4802 Spinal stenosis, cervical region: Secondary | ICD-10-CM | POA: Diagnosis not present

## 2019-07-03 DIAGNOSIS — E785 Hyperlipidemia, unspecified: Secondary | ICD-10-CM | POA: Diagnosis not present

## 2019-07-03 DIAGNOSIS — R29810 Facial weakness: Secondary | ICD-10-CM | POA: Diagnosis not present

## 2019-07-03 DIAGNOSIS — I700 Atherosclerosis of aorta: Secondary | ICD-10-CM | POA: Diagnosis not present

## 2019-07-06 DIAGNOSIS — M79671 Pain in right foot: Secondary | ICD-10-CM | POA: Diagnosis not present

## 2019-07-06 DIAGNOSIS — M79672 Pain in left foot: Secondary | ICD-10-CM | POA: Diagnosis not present

## 2019-07-06 DIAGNOSIS — E1140 Type 2 diabetes mellitus with diabetic neuropathy, unspecified: Secondary | ICD-10-CM | POA: Diagnosis not present

## 2019-07-06 DIAGNOSIS — I739 Peripheral vascular disease, unspecified: Secondary | ICD-10-CM | POA: Diagnosis not present

## 2019-07-06 DIAGNOSIS — L110 Acquired keratosis follicularis: Secondary | ICD-10-CM | POA: Diagnosis not present

## 2019-07-07 DIAGNOSIS — H401131 Primary open-angle glaucoma, bilateral, mild stage: Secondary | ICD-10-CM | POA: Diagnosis not present

## 2019-07-07 DIAGNOSIS — Z7984 Long term (current) use of oral hypoglycemic drugs: Secondary | ICD-10-CM | POA: Diagnosis not present

## 2019-07-07 DIAGNOSIS — E119 Type 2 diabetes mellitus without complications: Secondary | ICD-10-CM | POA: Diagnosis not present

## 2019-07-07 DIAGNOSIS — Z961 Presence of intraocular lens: Secondary | ICD-10-CM | POA: Diagnosis not present

## 2019-07-08 DIAGNOSIS — E1122 Type 2 diabetes mellitus with diabetic chronic kidney disease: Secondary | ICD-10-CM | POA: Diagnosis not present

## 2019-07-08 DIAGNOSIS — K851 Biliary acute pancreatitis without necrosis or infection: Secondary | ICD-10-CM | POA: Diagnosis not present

## 2019-07-08 DIAGNOSIS — D329 Benign neoplasm of meninges, unspecified: Secondary | ICD-10-CM | POA: Diagnosis not present

## 2019-07-08 DIAGNOSIS — D630 Anemia in neoplastic disease: Secondary | ICD-10-CM | POA: Diagnosis not present

## 2019-07-08 DIAGNOSIS — D709 Neutropenia, unspecified: Secondary | ICD-10-CM | POA: Diagnosis not present

## 2019-07-08 DIAGNOSIS — R4781 Slurred speech: Secondary | ICD-10-CM | POA: Diagnosis not present

## 2019-07-08 DIAGNOSIS — R29810 Facial weakness: Secondary | ICD-10-CM | POA: Diagnosis not present

## 2019-07-08 DIAGNOSIS — I080 Rheumatic disorders of both mitral and aortic valves: Secondary | ICD-10-CM | POA: Diagnosis not present

## 2019-07-08 DIAGNOSIS — G319 Degenerative disease of nervous system, unspecified: Secondary | ICD-10-CM | POA: Diagnosis not present

## 2019-07-08 DIAGNOSIS — Z6820 Body mass index (BMI) 20.0-20.9, adult: Secondary | ICD-10-CM | POA: Diagnosis not present

## 2019-07-08 DIAGNOSIS — E872 Acidosis: Secondary | ICD-10-CM | POA: Diagnosis not present

## 2019-07-08 DIAGNOSIS — M47812 Spondylosis without myelopathy or radiculopathy, cervical region: Secondary | ICD-10-CM | POA: Diagnosis not present

## 2019-07-08 DIAGNOSIS — D631 Anemia in chronic kidney disease: Secondary | ICD-10-CM | POA: Diagnosis not present

## 2019-07-08 DIAGNOSIS — E785 Hyperlipidemia, unspecified: Secondary | ICD-10-CM | POA: Diagnosis not present

## 2019-07-08 DIAGNOSIS — M4802 Spinal stenosis, cervical region: Secondary | ICD-10-CM | POA: Diagnosis not present

## 2019-07-08 DIAGNOSIS — N189 Chronic kidney disease, unspecified: Secondary | ICD-10-CM | POA: Diagnosis not present

## 2019-07-08 DIAGNOSIS — I2510 Atherosclerotic heart disease of native coronary artery without angina pectoris: Secondary | ICD-10-CM | POA: Diagnosis not present

## 2019-07-08 DIAGNOSIS — E43 Unspecified severe protein-calorie malnutrition: Secondary | ICD-10-CM | POA: Diagnosis not present

## 2019-07-08 DIAGNOSIS — E871 Hypo-osmolality and hyponatremia: Secondary | ICD-10-CM | POA: Diagnosis not present

## 2019-07-08 DIAGNOSIS — K8031 Calculus of bile duct with cholangitis, unspecified, with obstruction: Secondary | ICD-10-CM | POA: Diagnosis not present

## 2019-07-08 DIAGNOSIS — N179 Acute kidney failure, unspecified: Secondary | ICD-10-CM | POA: Diagnosis not present

## 2019-07-08 DIAGNOSIS — I700 Atherosclerosis of aorta: Secondary | ICD-10-CM | POA: Diagnosis not present

## 2019-07-08 DIAGNOSIS — I1310 Hypertensive heart and chronic kidney disease without heart failure, with stage 1 through stage 4 chronic kidney disease, or unspecified chronic kidney disease: Secondary | ICD-10-CM | POA: Diagnosis not present

## 2019-07-14 ENCOUNTER — Other Ambulatory Visit: Payer: Medicare Other

## 2019-07-15 DIAGNOSIS — E872 Acidosis: Secondary | ICD-10-CM | POA: Diagnosis not present

## 2019-07-15 DIAGNOSIS — I1310 Hypertensive heart and chronic kidney disease without heart failure, with stage 1 through stage 4 chronic kidney disease, or unspecified chronic kidney disease: Secondary | ICD-10-CM | POA: Diagnosis not present

## 2019-07-15 DIAGNOSIS — Z6820 Body mass index (BMI) 20.0-20.9, adult: Secondary | ICD-10-CM | POA: Diagnosis not present

## 2019-07-15 DIAGNOSIS — M4802 Spinal stenosis, cervical region: Secondary | ICD-10-CM | POA: Diagnosis not present

## 2019-07-15 DIAGNOSIS — N189 Chronic kidney disease, unspecified: Secondary | ICD-10-CM | POA: Diagnosis not present

## 2019-07-15 DIAGNOSIS — D631 Anemia in chronic kidney disease: Secondary | ICD-10-CM | POA: Diagnosis not present

## 2019-07-15 DIAGNOSIS — G319 Degenerative disease of nervous system, unspecified: Secondary | ICD-10-CM | POA: Diagnosis not present

## 2019-07-15 DIAGNOSIS — I080 Rheumatic disorders of both mitral and aortic valves: Secondary | ICD-10-CM | POA: Diagnosis not present

## 2019-07-15 DIAGNOSIS — I700 Atherosclerosis of aorta: Secondary | ICD-10-CM | POA: Diagnosis not present

## 2019-07-15 DIAGNOSIS — D329 Benign neoplasm of meninges, unspecified: Secondary | ICD-10-CM | POA: Diagnosis not present

## 2019-07-15 DIAGNOSIS — K851 Biliary acute pancreatitis without necrosis or infection: Secondary | ICD-10-CM | POA: Diagnosis not present

## 2019-07-15 DIAGNOSIS — E43 Unspecified severe protein-calorie malnutrition: Secondary | ICD-10-CM | POA: Diagnosis not present

## 2019-07-15 DIAGNOSIS — D709 Neutropenia, unspecified: Secondary | ICD-10-CM | POA: Diagnosis not present

## 2019-07-15 DIAGNOSIS — K8031 Calculus of bile duct with cholangitis, unspecified, with obstruction: Secondary | ICD-10-CM | POA: Diagnosis not present

## 2019-07-15 DIAGNOSIS — I2510 Atherosclerotic heart disease of native coronary artery without angina pectoris: Secondary | ICD-10-CM | POA: Diagnosis not present

## 2019-07-15 DIAGNOSIS — N179 Acute kidney failure, unspecified: Secondary | ICD-10-CM | POA: Diagnosis not present

## 2019-07-15 DIAGNOSIS — R4781 Slurred speech: Secondary | ICD-10-CM | POA: Diagnosis not present

## 2019-07-15 DIAGNOSIS — E785 Hyperlipidemia, unspecified: Secondary | ICD-10-CM | POA: Diagnosis not present

## 2019-07-15 DIAGNOSIS — M47812 Spondylosis without myelopathy or radiculopathy, cervical region: Secondary | ICD-10-CM | POA: Diagnosis not present

## 2019-07-15 DIAGNOSIS — R29810 Facial weakness: Secondary | ICD-10-CM | POA: Diagnosis not present

## 2019-07-15 DIAGNOSIS — E1122 Type 2 diabetes mellitus with diabetic chronic kidney disease: Secondary | ICD-10-CM | POA: Diagnosis not present

## 2019-07-15 DIAGNOSIS — D630 Anemia in neoplastic disease: Secondary | ICD-10-CM | POA: Diagnosis not present

## 2019-07-15 DIAGNOSIS — E871 Hypo-osmolality and hyponatremia: Secondary | ICD-10-CM | POA: Diagnosis not present

## 2019-07-23 NOTE — Progress Notes (Signed)
[facility]   [phone] [phone]   Clinic Follow up Note   Patient Care Team: Debra Noble, MD as PCP - General (Internal Medicine) Debra [name], Debra Estimable, MD as Consulting Physician (Gastroenterology) Debra Finner, RN as Oncology Nurse Navigator Truitt Merle, MD as Consulting Physician (Hematology) Debra [name], Debra Sos, PA-C as Physician Assistant (Physician Assistant)  Date of Service:  07/29/2019  CHIEF COMPLAINT: F/u of Ampullary carcinoma  SUMMARY OF ONCOLOGIC HISTORY: Oncology History Overview Note  Cancer Staging Ampullary carcinoma [facility]) Staging form: Small Intestine - Adenocarcinoma, AJCC 8th Edition - Clinical stage from 05/20/2019: Stage Unknown (cTX, cN0, cM0) - Signed by Truitt Merle, MD on 06/30/2019    Ampullary carcinoma ([place])  03/20/2019 Imaging   1. Dilated gallbladder with cholelithiasis and mild wall thickening. This is suspicious for cholecystitis. Recommend clinical correlation, and consider nuclear medicine hepatobiliary scan for further evaluation. 2. Diffuse biliary ductal dilatation, with common bile duct measuring 14 mm in diameter. Recommend correlation with liver function tests, and consider MRCP or ERCP for further evaluation.   05/18/2019 Imaging   Profound intra and extrahepatic biliary ductal dilatation in addition to dilatation of the pancreatic duct, slightly increased compared to prior examination dated 03/20/2019 and highly concerning for a small ampullary or pancreatic malignancy which is not directly visualized.   05/20/2019 Initial Diagnosis   Ampullary carcinoma ([place])   05/20/2019 Procedure   ERCP under the care of Dr. 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 This was biopsied extensively. - The mass causes a short, abrupt distal CBD stricture which was stented with a 4cm long 62m diameter uncovered metal mesh stent.   05/20/2019 Pathology  Results   SURGICAL PATHOLOGY [id number]   A. AMPULLARY, BIOPSY: - Poorly differentiated adenocarcinoma   05/20/2019 Cancer Staging   Staging form: Small Intestine - Adenocarcinoma, AJCC 8th Edition - Clinical stage from 05/20/2019: Stage Unknown (cTX, cN0, cM0) - Signed by FTruitt Merle MD on 06/30/2019   06/03/2019 Imaging   CT Chest wo Contrast  IMPRESSION: 1. No evidence of metastatic disease within the chest, study mildly limited by respiratory motion at the lung bases. 2. Dilated central pulmonary vasculature up to 3.6 cm, likely reflects pulmonary arterial hypertension 3. Three-vessel coronary artery calcification. 4. Aortic atherosclerosis.   Aortic Atherosclerosis (ICD10-I70.0).   06/17/2019 - 06/26/2019 Radiation Therapy   SBRT with Dr MLisbeth Renshaw6/2/21-6/11/21.       CURRENT THERAPY:  Observation  INTERVAL HISTORY:  MLORIEL DIEHLis here for a follow up of Ampullary carcinoma. She presents to the clinic with her family member. She notes she did well with recent SBRT. She did not have nausea or stomach irritation. She has been on Prilosec for her acid reflex which helps. She has lost 2 pounds since last month. Her eating has not improved. She tried Megace but did not feel this helped. She has still been taking Glucerna.  Her family member notes she will get disoriented and not knowing where she is. She fell 2 night ago in the middle of the night. She plans to f/u with Neurologist soon. She saw her PCP 2 weeks ago.    REVIEW OF SYSTEMS:   Constitutional: Denies fevers, chills or abnormal weight loss Eyes: Denies blurriness of vision Ears, nose, mouth, throat, and face: Denies mucositis or sore throat Respiratory: Denies cough, dyspnea or wheezes Cardiovascular: Denies palpitation, chest discomfort or lower extremity swelling Gastrointestinal:  Denies nausea, heartburn or change  in bowel habits Skin: Denies abnormal skin rashes Lymphatics: Denies new lymphadenopathy or easy  bruising Neurological:Denies numbness, tingling or new weaknesses Behavioral/Psych: Mood is stable, no new changes  All other systems were reviewed with the patient and are negative.  MEDICAL HISTORY:  Past Medical History:  Diagnosis Date  . CKD (chronic kidney disease)   . Diabetes mellitus without complication ([place])   . Gout   . Hypercholesteremia   . Hypertension   . 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 BILIARY STENT PLACEMENT  05/20/2019   Procedure: BILIARY STENT PLACEMENT;  Surgeon: Milus Banister, MD;  Location: [facility] ENDOSCOPY;  Service: Endoscopy;;  . BIOPSY  05/20/2019   Procedure: BIOPSY;  Surgeon: Milus Banister, MD;  Location: [facility] ENDOSCOPY;  Service: Endoscopy;;  . ERCP N/A 05/20/2019   Procedure: ENDOSCOPIC RETROGRADE CHOLANGIOPANCREATOGRAPHY (ERCP);  Surgeon: Milus Banister, MD;  Location: [facility] ENDOSCOPY;  Service: Endoscopy;  Laterality: N/A;  . TOTAL KNEE ARTHROPLASTY Left     I have reviewed the social history and family history with the patient and they are unchanged from previous note.  ALLERGIES:  is allergic to other.  MEDICATIONS:  Current Outpatient Medications  Medication Sig Dispense Refill  . ACCU-CHEK GUIDE test strip 2 (two) times daily. use for testing    . Accu-Chek Softclix Lancets lancets USE TWICE DAILY with strips    . aspirin EC 81 MG tablet Take 81 mg by mouth daily.    . Blood Glucose Monitoring Suppl (ACCU-CHEK GUIDE) w/Device KIT USE AS DIRECTED TO test TWICE DAILY    . hydrochlorothiazide (HYDRODIURIL) 25 MG tablet Take 25 mg by mouth daily.    . Insulin Infusion Pump (ACCU-CHEK COMBO) KIT 1 application by Does not apply route 2 (two) times daily. 1 kit 0  . latanoprost (XALATAN) 0.005 % ophthalmic solution Place 1 drop into both eyes at bedtime.     . megestrol (MEGACE ES) 625 MG/5ML suspension Take 5 mLs (625 mg total) by mouth daily. 150 mL 0  .  metFORMIN (GLUCOPHAGE-XR) 500 MG 24 hr tablet Take 500 mg by mouth 2 (two) times daily.    . mirtazapine (REMERON) 7.5 MG tablet Take 1 tablet (7.5 mg total) by mouth at bedtime. 30 tablet 2  . omeprazole (PRILOSEC) 20 MG capsule Take 1 capsule (20 mg total) by mouth daily. 30 capsule 3  . potassium chloride (KLOR-CON) 10 MEQ tablet Take 1 tablet (10 mEq total) by mouth daily. 15 tablet 0  . senna (SENOKOT) 8.6 MG TABS tablet Take 1 tablet (8.6 mg total) by mouth at bedtime as needed for mild constipation. 120 tablet 0  . triamcinolone (KENALOG) 0.025 % cream Apply 1 application topically 2 (two) times daily as needed.     No current facility-administered medications for this visit.    PHYSICAL EXAMINATION: ECOG PERFORMANCE STATUS: 2 - Symptomatic, <50% confined to bed  Vitals:   07/29/19 0949  BP: 94/64  Pulse: (!) 101  Resp: 18  Temp: 97.9 F (36.6 C)  SpO2: 100%   Filed Weights   07/29/19 0949  Weight: 135 lb (61.2 kg)    GENERAL:alert, no distress and comfortable SKIN: skin color, texture, turgor are normal, no rashes or significant lesions EYES: normal, Conjunctiva are pink and non-injected, sclera clear  NECK: supple, thyroid normal size, non-tender, without nodularity LYMPH:  no palpable lymphadenopathy in the cervical, axillary  LUNGS: clear to  auscultation and percussion with normal breathing effort HEART: regular rate & rhythm and no murmurs and no lower extremity edema ABDOMEN:abdomen soft, non-tender and normal bowel sounds Musculoskeletal:no cyanosis of digits and no clubbing  NEURO: alert & oriented x 3 with fluent speech, no focal motor/sensory deficits EXAM PERFORMED IN WHEELCHAIR  LABORATORY DATA:  I have reviewed the data as listed CBC Latest Ref Rng & Units 07/29/2019 06/30/2019 05/22/2019  WBC 4.0 - 10.5 K/uL 5.1 4.7 9.4  Hemoglobin 12.0 - 15.0 g/dL 11.2(L) 10.9(L) 8.2(L)  Hematocrit 36 - 46 % 32.8(L) 33.3(L) 24.5(L)  Platelets 150 - 400 K/uL 317 296  456(H)     CMP Latest Ref Rng & Units 07/29/2019 06/30/2019 06/10/2019  Glucose 70 - 99 mg/dL 89 87 -  BUN 8 - 23 mg/dL '9 8 11  ' Creatinine 0.44 - 1.00 mg/dL 0.74 0.76 0.86  Sodium 135 - 145 mmol/L 140 140 -  Potassium 3.5 - 5.1 mmol/L 3.2(L) 4.0 -  Chloride 98 - 111 mmol/L 104 105 -  CO2 22 - 32 mmol/L 24 24 -  Calcium 8.9 - 10.3 mg/dL 8.9 8.9 -  Total Protein 6.5 - 8.1 g/dL 6.4(L) 6.3(L) -  Total Bilirubin 0.3 - 1.2 mg/dL 0.7 1.1 -  Alkaline Phos 38 - 126 U/L 84 99 -  AST 15 - 41 U/L 20 20 -  ALT 0 - 44 U/L 12 11 -      RADIOGRAPHIC STUDIES: I have personally reviewed the radiological images as listed and agreed with the findings in the report. No results found.   ASSESSMENT & PLAN:  Debra [name] is a 86 y.o. female with    1.Ampullary adenocarcinoma, cTxN0M0, s/p radiation  -Patientdiagnosed in 05/2019 with painlessobstructive jaundice,biliary dilation,and elevated LFTs. -CT AP from 05/18/2019 notedprofound intra and extrahepatic biliary ductal dilatation in addition to dilatation of the pancreatic duct, slightly increased compared to prior examination dated 03/20/2019 and highly concerning for a small ampullary or pancreatic malignancy which is not directly visualized. -ERCP performed by Dr. Ardis Hughs on 05/20/2019 notedulcerated 2-3cm ampullary mass.The mass causes a short, abrupt distal CBD stricture whichwasstented with a 4cm long 53m diameter uncovered metal mesh stent. Her Pathologywas consistent with ampullary adenocarcinoma. -Baseline CEA 7.5 05/19/19, CA 19.9 <2 05/19/19, staging scan was negative for node or distant metastasis. -Patient is not a candidate for surgery or chemotherapy due to her advanced age and multiple comorbidities including dementia. -To provide some disease control she completed SBRT with Dr MLisbeth Renshaw6/2/21-6/11/21. She tolerated very well. I reviewed RT is unlikely curative , but hopefully will control her cancer for a while  -I discussed  proceeding with observation now. I will scan her only if she develops symptoms concerning for disease progression.  -If she becomes more symptomatic I can refer her to palliative or hospice home care to help manage her symptoms.  -She feels she is physically improving. Her appetite is still low but weight loss has slowed down. Her memory loss is also slow progressing.  -Labs reviewed, Hg 11.2, ANC 1.5, K 3.2, protein 6.4, albumin 2.6. I will start her on oral potassium (she can cut in half). I also encouraged her to increase potassium in diet.  -Will continue observation and supportive care. She remains at home with her sons and good family support. I encouraged her to watch for abdominal pain, nausea and extreme fatigue.  -F/u in 3 months    2. Weight loss/insomnia/deconditioning  -Patient has reportedly lost 80 pounds over this past  year along with early satiety, decreased appetite, and weight loss. The patient is also reporting insomnia/restlessness. -Patient recently started nutritional supplementation with Glucerna.Encouraged to continue. -The patient is unable to stand up or sit down without assistance. She spends most of the day in the recliner. She lives at home with her 2 sons and has home health aide 5 days a week. She is currently undergoing physical therapy due to her deconditioning. Recommend that she continue physical therapy. -She will continue Prilosec 73m for her acid reflux.  -I previously called in Mirtazapine 7.5 mg, but did not tolerate due to delusions and disorientation. She tried Megace but this did not help her.  -After SBRT she has improved some. Her weight loss has slowed down but her appetite has not improved. I encouraged her to continue Glucerna. I also encouraged her to f/u with her to dietician -Her memory continues to decline and she will occasionally become disoriented. I encouraged her and her family to avoid falls.   3.CKD -probably secondary to DM.  Stable  4. Anemia -Previous anemia work-up showed high ferritin, low serum iron and TIBC, consistent with anemia of, secondary to underlying malignancy. -We will consider blood transfusion as needed. -Hg 11.2 today (07/29/19). Iron panel still pending.    Plan: -I called in potassium today  -Continue observation and supportive care. -Lab and f/u with NP Lacie in 3 months  -she declined palliative care for now   No problem-specific Assessment & Plan notes found for this encounter.   No orders of the defined types were placed in this encounter.  All questions were answered. The patient knows to call the clinic with any problems, questions or concerns. No barriers to learning was detected. The total time spent in the appointment was 30 minutes.     YTruitt Merle MD 07/29/2019   I, AJoslyn Devon am acting as scribe for YTruitt Merle MD.   I have reviewed the above documentation for accuracy and completeness, and I agree with the above.

## 2019-07-29 ENCOUNTER — Other Ambulatory Visit: Payer: Self-pay

## 2019-07-29 ENCOUNTER — Inpatient Hospital Stay: Payer: Medicare Other

## 2019-07-29 ENCOUNTER — Encounter: Payer: Self-pay | Admitting: Hematology

## 2019-07-29 ENCOUNTER — Inpatient Hospital Stay: Payer: Medicare Other | Attending: Physician Assistant | Admitting: Hematology

## 2019-07-29 ENCOUNTER — Telehealth: Payer: Self-pay | Admitting: Hematology

## 2019-07-29 VITALS — BP 94/64 | HR 101 | Temp 97.9°F | Resp 18 | Ht 67.0 in | Wt 135.0 lb

## 2019-07-29 DIAGNOSIS — C241 Malignant neoplasm of ampulla of Vater: Secondary | ICD-10-CM | POA: Diagnosis not present

## 2019-07-29 DIAGNOSIS — D649 Anemia, unspecified: Secondary | ICD-10-CM | POA: Insufficient documentation

## 2019-07-29 DIAGNOSIS — R7989 Other specified abnormal findings of blood chemistry: Secondary | ICD-10-CM | POA: Diagnosis not present

## 2019-07-29 DIAGNOSIS — E876 Hypokalemia: Secondary | ICD-10-CM | POA: Diagnosis not present

## 2019-07-29 DIAGNOSIS — E119 Type 2 diabetes mellitus without complications: Secondary | ICD-10-CM | POA: Insufficient documentation

## 2019-07-29 DIAGNOSIS — G4700 Insomnia, unspecified: Secondary | ICD-10-CM | POA: Diagnosis not present

## 2019-07-29 DIAGNOSIS — Z923 Personal history of irradiation: Secondary | ICD-10-CM | POA: Insufficient documentation

## 2019-07-29 DIAGNOSIS — R413 Other amnesia: Secondary | ICD-10-CM | POA: Diagnosis not present

## 2019-07-29 DIAGNOSIS — K219 Gastro-esophageal reflux disease without esophagitis: Secondary | ICD-10-CM | POA: Insufficient documentation

## 2019-07-29 DIAGNOSIS — N189 Chronic kidney disease, unspecified: Secondary | ICD-10-CM | POA: Diagnosis not present

## 2019-07-29 DIAGNOSIS — Z7984 Long term (current) use of oral hypoglycemic drugs: Secondary | ICD-10-CM | POA: Diagnosis not present

## 2019-07-29 DIAGNOSIS — R296 Repeated falls: Secondary | ICD-10-CM | POA: Insufficient documentation

## 2019-07-29 DIAGNOSIS — R634 Abnormal weight loss: Secondary | ICD-10-CM | POA: Insufficient documentation

## 2019-07-29 LAB — CMP (CANCER CENTER ONLY)
ALT: 12 U/L (ref 0–44)
AST: 20 U/L (ref 15–41)
Albumin: 2.6 g/dL — ABNORMAL LOW (ref 3.5–5.0)
Alkaline Phosphatase: 84 U/L (ref 38–126)
Anion gap: 12 (ref 5–15)
BUN: 9 mg/dL (ref 8–23)
CO2: 24 mmol/L (ref 22–32)
Calcium: 8.9 mg/dL (ref 8.9–10.3)
Chloride: 104 mmol/L (ref 98–111)
Creatinine: 0.74 mg/dL (ref 0.44–1.00)
GFR, Est AFR Am: >60 mL/min (ref >60)
GFR, Estimated: >60 mL/min (ref >60)
Glucose, Bld: 89 mg/dL (ref 70–99)
Potassium: 3.2 mmol/L — ABNORMAL LOW (ref 3.5–5.1)
Sodium: 140 mmol/L (ref 135–145)
Total Bilirubin: 0.7 mg/dL (ref 0.3–1.2)
Total Protein: 6.4 g/dL — ABNORMAL LOW (ref 6.5–8.1)

## 2019-07-29 LAB — FERRITIN: Ferritin: 112 ng/mL (ref 11–307)

## 2019-07-29 LAB — CBC WITH DIFFERENTIAL (CANCER CENTER ONLY)
Abs Immature Granulocytes: 0 10*3/uL (ref 0.00–0.07)
Basophils Absolute: 0 10*3/uL (ref 0.0–0.1)
Basophils Relative: 1 %
Eosinophils Absolute: 0.1 10*3/uL (ref 0.0–0.5)
Eosinophils Relative: 3 %
HCT: 32.8 % — ABNORMAL LOW (ref 36.0–46.0)
Hemoglobin: 11.2 g/dL — ABNORMAL LOW (ref 12.0–15.0)
Immature Granulocytes: 0 %
Lymphocytes Relative: 57 %
Lymphs Abs: 3 10*3/uL (ref 0.7–4.0)
MCH: 30.7 pg (ref 26.0–34.0)
MCHC: 34.1 g/dL (ref 30.0–36.0)
MCV: 89.9 fL (ref 80.0–100.0)
Monocytes Absolute: 0.5 10*3/uL (ref 0.1–1.0)
Monocytes Relative: 9 %
Neutro Abs: 1.5 10*3/uL — ABNORMAL LOW (ref 1.7–7.7)
Neutrophils Relative %: 30 %
Platelet Count: 317 10*3/uL (ref 150–400)
RBC: 3.65 MIL/uL — ABNORMAL LOW (ref 3.87–5.11)
RDW: 14.4 % (ref 11.5–15.5)
WBC Count: 5.1 10*3/uL (ref 4.0–10.5)
nRBC: 0 % (ref 0.0–0.2)

## 2019-07-29 LAB — IRON AND TIBC
Iron: 53 ug/dL (ref 41–142)
Saturation Ratios: 32 % (ref 21–57)
TIBC: 165 ug/dL — ABNORMAL LOW (ref 236–444)
UIBC: 113 ug/dL — ABNORMAL LOW (ref 120–384)

## 2019-07-29 LAB — CEA (IN HOUSE-CHCC): CEA (CHCC-In House): 3.28 ng/mL (ref 0.00–5.00)

## 2019-07-29 MED ORDER — POTASSIUM CHLORIDE ER 10 MEQ PO TBCR: 10.0000 meq | EXTENDED_RELEASE_TABLET | Freq: Every day | ORAL | 0 refills | Status: AC

## 2019-07-29 MED ORDER — OMEPRAZOLE 20 MG PO CPDR: 20.0000 mg | DELAYED_RELEASE_CAPSULE | Freq: Every day | ORAL | 3 refills | Status: AC

## 2019-07-29 NOTE — Telephone Encounter (Signed)
Scheduled per 07/14 los, patient has received updated calender. 

## 2019-08-05 ENCOUNTER — Ambulatory Visit (INDEPENDENT_AMBULATORY_CARE_PROVIDER_SITE_OTHER): Payer: Medicare Other | Admitting: Neurology

## 2019-08-05 DIAGNOSIS — R410 Disorientation, unspecified: Secondary | ICD-10-CM

## 2019-08-05 DIAGNOSIS — D329 Benign neoplasm of meninges, unspecified: Secondary | ICD-10-CM

## 2019-08-05 DIAGNOSIS — G459 Transient cerebral ischemic attack, unspecified: Secondary | ICD-10-CM

## 2019-08-05 DIAGNOSIS — F0280 Dementia in other diseases classified elsewhere without behavioral disturbance: Secondary | ICD-10-CM

## 2019-08-10 ENCOUNTER — Telehealth: Payer: Self-pay

## 2019-08-10 ENCOUNTER — Other Ambulatory Visit: Payer: Self-pay

## 2019-08-10 ENCOUNTER — Telehealth: Payer: Self-pay | Admitting: Hematology

## 2019-08-10 ENCOUNTER — Other Ambulatory Visit: Payer: Self-pay | Admitting: Hematology

## 2019-08-10 DIAGNOSIS — C241 Malignant neoplasm of ampulla of Vater: Secondary | ICD-10-CM

## 2019-08-10 NOTE — Telephone Encounter (Signed)
I called pt's daughter [name] back regarding her concerns of her abdominal pain, low appetite and insomnia.  She states pain is in the mid abdomen, got worse after drinking orange juice, overall mild and tolerable.  I encouraged her to take a Tylenol as needed, and avoid any acid spicy food.  She denies any nausea, vomiting, the bile has been moving normally.  No signs of bowel obstruction.  She is concerning for worsening dementia.  Her neurologist recently ordered MRI, results still pending. I encouraged her to follow-up with neurology and her primary care physician.   I also discussed palliative home care service and hospice. I think it's beneficial for her to have palliative care home service, given her underlying malignancy and multiple symptoms. I will copy her PCP Dr. Willey Blade on this too. She agrees with the referral.  I will refer her to [place] care and hospice program.  Truitt Merle  08/10/2019

## 2019-08-10 NOTE — Telephone Encounter (Signed)
Received call from Debra [name]'s daughter, [name].  She states her mother is complaining of stomach pains, she is still taking the prilosec.  Her appetite remains poor. Her dementia has gotten worse, as she is not sleeping well at night.

## 2019-08-10 NOTE — Telephone Encounter (Signed)
Referral, last 2 ov notes, and demographics page faxed to [facility], palliative care services.

## 2019-08-13 ENCOUNTER — Telehealth: Payer: Self-pay | Admitting: *Deleted

## 2019-08-13 DIAGNOSIS — C241 Malignant neoplasm of ampulla of Vater: Secondary | ICD-10-CM | POA: Diagnosis not present

## 2019-08-13 DIAGNOSIS — Z515 Encounter for palliative care: Secondary | ICD-10-CM | POA: Diagnosis not present

## 2019-08-13 NOTE — Progress Notes (Signed)
Kindly inform the patient that EEG study shows mild slowing of brain wave activity throughout which is not an unexpected finding with age.  No definite seizures or any worrisome finding

## 2019-08-13 NOTE — Telephone Encounter (Signed)
Called daughter, [name] on [place] and informed her the patient's  EEG study shows mild slowing of brain wave activity throughout which is not an unexpected finding with age. There were no definite seizures or any worrisome finding. She verbalized understanding, appreciation.

## 2019-08-14 DIAGNOSIS — E1129 Type 2 diabetes mellitus with other diabetic kidney complication: Secondary | ICD-10-CM | POA: Diagnosis not present

## 2019-08-14 DIAGNOSIS — C241 Malignant neoplasm of ampulla of Vater: Secondary | ICD-10-CM | POA: Diagnosis not present

## 2019-08-15 ENCOUNTER — Encounter (HOSPITAL_BASED_OUTPATIENT_CLINIC_OR_DEPARTMENT_OTHER): Payer: Self-pay | Admitting: Emergency Medicine

## 2019-08-15 ENCOUNTER — Emergency Department (HOSPITAL_BASED_OUTPATIENT_CLINIC_OR_DEPARTMENT_OTHER): Payer: Medicare Other

## 2019-08-15 ENCOUNTER — Other Ambulatory Visit: Payer: Self-pay

## 2019-08-15 ENCOUNTER — Inpatient Hospital Stay (HOSPITAL_BASED_OUTPATIENT_CLINIC_OR_DEPARTMENT_OTHER)
Admission: EM | Admit: 2019-08-15 | Discharge: 2019-08-20 | DRG: 381 | Disposition: A | Discharge status: Home / Self Care | Payer: Medicare Other | Attending: Internal Medicine | Admitting: Internal Medicine

## 2019-08-15 DIAGNOSIS — C241 Malignant neoplasm of ampulla of Vater: Secondary | ICD-10-CM | POA: Diagnosis not present

## 2019-08-15 DIAGNOSIS — D473 Essential (hemorrhagic) thrombocythemia: Secondary | ICD-10-CM | POA: Diagnosis not present

## 2019-08-15 DIAGNOSIS — Z8249 Family history of ischemic heart disease and other diseases of the circulatory system: Secondary | ICD-10-CM

## 2019-08-15 DIAGNOSIS — Z833 Family history of diabetes mellitus: Secondary | ICD-10-CM

## 2019-08-15 DIAGNOSIS — E876 Hypokalemia: Secondary | ICD-10-CM | POA: Diagnosis present

## 2019-08-15 DIAGNOSIS — C250 Malignant neoplasm of head of pancreas: Secondary | ICD-10-CM

## 2019-08-15 DIAGNOSIS — Z7982 Long term (current) use of aspirin: Secondary | ICD-10-CM

## 2019-08-15 DIAGNOSIS — Z931 Gastrostomy status: Secondary | ICD-10-CM | POA: Diagnosis not present

## 2019-08-15 DIAGNOSIS — Z20822 Contact with and (suspected) exposure to covid-19: Secondary | ICD-10-CM | POA: Diagnosis not present

## 2019-08-15 DIAGNOSIS — K838 Other specified diseases of biliary tract: Secondary | ICD-10-CM | POA: Diagnosis not present

## 2019-08-15 DIAGNOSIS — Z4659 Encounter for fitting and adjustment of other gastrointestinal appliance and device: Secondary | ICD-10-CM | POA: Diagnosis not present

## 2019-08-15 DIAGNOSIS — K315 Obstruction of duodenum: Secondary | ICD-10-CM | POA: Diagnosis not present

## 2019-08-15 DIAGNOSIS — K56609 Unspecified intestinal obstruction, unspecified as to partial versus complete obstruction: Secondary | ICD-10-CM | POA: Diagnosis not present

## 2019-08-15 DIAGNOSIS — K8020 Calculus of gallbladder without cholecystitis without obstruction: Secondary | ICD-10-CM | POA: Diagnosis not present

## 2019-08-15 DIAGNOSIS — Z96652 Presence of left artificial knee joint: Secondary | ICD-10-CM | POA: Diagnosis present

## 2019-08-15 DIAGNOSIS — E872 Acidosis: Secondary | ICD-10-CM | POA: Diagnosis present

## 2019-08-15 DIAGNOSIS — F0390 Unspecified dementia without behavioral disturbance: Secondary | ICD-10-CM | POA: Diagnosis present

## 2019-08-15 DIAGNOSIS — I700 Atherosclerosis of aorta: Secondary | ICD-10-CM | POA: Diagnosis not present

## 2019-08-15 DIAGNOSIS — E119 Type 2 diabetes mellitus without complications: Secondary | ICD-10-CM

## 2019-08-15 DIAGNOSIS — D75839 Thrombocytosis, unspecified: Secondary | ICD-10-CM

## 2019-08-15 DIAGNOSIS — E1169 Type 2 diabetes mellitus with other specified complication: Secondary | ICD-10-CM | POA: Diagnosis not present

## 2019-08-15 DIAGNOSIS — R7989 Other specified abnormal findings of blood chemistry: Secondary | ICD-10-CM

## 2019-08-15 DIAGNOSIS — E7800 Pure hypercholesterolemia, unspecified: Secondary | ICD-10-CM

## 2019-08-15 DIAGNOSIS — R1010 Upper abdominal pain, unspecified: Secondary | ICD-10-CM | POA: Diagnosis not present

## 2019-08-15 DIAGNOSIS — R109 Unspecified abdominal pain: Secondary | ICD-10-CM | POA: Diagnosis present

## 2019-08-15 DIAGNOSIS — K8021 Calculus of gallbladder without cholecystitis with obstruction: Secondary | ICD-10-CM | POA: Diagnosis present

## 2019-08-15 DIAGNOSIS — K311 Adult hypertrophic pyloric stenosis: Secondary | ICD-10-CM | POA: Diagnosis present

## 2019-08-15 DIAGNOSIS — E785 Hyperlipidemia, unspecified: Secondary | ICD-10-CM | POA: Diagnosis not present

## 2019-08-15 DIAGNOSIS — E871 Hypo-osmolality and hyponatremia: Secondary | ICD-10-CM | POA: Diagnosis present

## 2019-08-15 DIAGNOSIS — K851 Biliary acute pancreatitis without necrosis or infection: Secondary | ICD-10-CM | POA: Diagnosis not present

## 2019-08-15 DIAGNOSIS — Z9889 Other specified postprocedural states: Secondary | ICD-10-CM | POA: Diagnosis not present

## 2019-08-15 DIAGNOSIS — E11649 Type 2 diabetes mellitus with hypoglycemia without coma: Secondary | ICD-10-CM | POA: Diagnosis not present

## 2019-08-15 DIAGNOSIS — Z923 Personal history of irradiation: Secondary | ICD-10-CM

## 2019-08-15 DIAGNOSIS — R000 Tachycardia, unspecified: Secondary | ICD-10-CM | POA: Diagnosis not present

## 2019-08-15 DIAGNOSIS — I10 Essential (primary) hypertension: Secondary | ICD-10-CM | POA: Diagnosis not present

## 2019-08-15 DIAGNOSIS — K3189 Other diseases of stomach and duodenum: Secondary | ICD-10-CM | POA: Diagnosis not present

## 2019-08-15 LAB — COMPREHENSIVE METABOLIC PANEL
ALT: 14 U/L (ref 0–44)
AST: 26 U/L (ref 15–41)
Albumin: 2.9 g/dL — ABNORMAL LOW (ref 3.5–5.0)
Alkaline Phosphatase: 74 U/L (ref 38–126)
Anion gap: 15 (ref 5–15)
BUN: 12 mg/dL (ref 8–23)
CO2: 23 mmol/L (ref 22–32)
Calcium: 8.7 mg/dL — ABNORMAL LOW (ref 8.9–10.3)
Chloride: 93 mmol/L — ABNORMAL LOW (ref 98–111)
Creatinine, Ser: 0.79 mg/dL (ref 0.44–1.00)
GFR calc Af Amer: >60 mL/min (ref >60)
GFR calc non Af Amer: >60 mL/min (ref >60)
Glucose, Bld: 150 mg/dL — ABNORMAL HIGH (ref 70–99)
Potassium: 3.3 mmol/L — ABNORMAL LOW (ref 3.5–5.1)
Sodium: 131 mmol/L — ABNORMAL LOW (ref 135–145)
Total Bilirubin: 0.5 mg/dL (ref 0.3–1.2)
Total Protein: 6.9 g/dL (ref 6.5–8.1)

## 2019-08-15 LAB — CBC WITH DIFFERENTIAL/PLATELET
Abs Immature Granulocytes: 0.01 10*3/uL (ref 0.00–0.07)
Basophils Absolute: 0 10*3/uL (ref 0.0–0.1)
Basophils Relative: 1 %
Eosinophils Absolute: 0.1 10*3/uL (ref 0.0–0.5)
Eosinophils Relative: 1 %
HCT: 35.7 % — ABNORMAL LOW (ref 36.0–46.0)
Hemoglobin: 12.1 g/dL (ref 12.0–15.0)
Immature Granulocytes: 0 %
Lymphocytes Relative: 40 %
Lymphs Abs: 2.6 10*3/uL (ref 0.7–4.0)
MCH: 30.3 pg (ref 26.0–34.0)
MCHC: 33.9 g/dL (ref 30.0–36.0)
MCV: 89.5 fL (ref 80.0–100.0)
Monocytes Absolute: 0.6 10*3/uL (ref 0.1–1.0)
Monocytes Relative: 9 %
Neutro Abs: 3.3 10*3/uL (ref 1.7–7.7)
Neutrophils Relative %: 49 %
Platelets: 418 10*3/uL — ABNORMAL HIGH (ref 150–400)
RBC: 3.99 MIL/uL (ref 3.87–5.11)
RDW: 14.2 % (ref 11.5–15.5)
WBC: 6.5 10*3/uL (ref 4.0–10.5)
nRBC: 0 % (ref 0.0–0.2)

## 2019-08-15 LAB — URINALYSIS, ROUTINE W REFLEX MICROSCOPIC
Bilirubin Urine: NEGATIVE
Glucose, UA: NEGATIVE mg/dL
Hgb urine dipstick: NEGATIVE
Ketones, ur: NEGATIVE mg/dL
Leukocytes,Ua: NEGATIVE
Nitrite: NEGATIVE
Protein, ur: NEGATIVE mg/dL
Specific Gravity, Urine: 1.01 (ref 1.005–1.030)
pH: 8.5 — ABNORMAL HIGH (ref 5.0–8.0)

## 2019-08-15 LAB — PROTIME-INR
INR: 1 (ref 0.8–1.2)
Prothrombin Time: 13.1 seconds (ref 11.4–15.2)

## 2019-08-15 LAB — LACTIC ACID, PLASMA
Lactic Acid, Venous: 4.9 mmol/L (ref 0.5–1.9)
Lactic Acid, Venous: 3.9 mmol/L (ref 0.5–1.9)
Lactic Acid, Venous: 2.4 mmol/L (ref 0.5–1.9)

## 2019-08-15 LAB — LIPASE, BLOOD: Lipase: 47 U/L (ref 11–51)

## 2019-08-15 LAB — SARS CORONAVIRUS 2 BY RT PCR (HOSPITAL ORDER, PERFORMED IN ~~LOC~~ HOSPITAL LAB): SARS Coronavirus 2: NEGATIVE

## 2019-08-15 IMAGING — CT CT ABD-PELV W/ CM
2 of 5 series · 16 of 46 positions shown, 18 images · IV contrast (Omnipaque)
Comparison: CT [DATE]

CLINICAL DATA: Abdominal pain. History of recently diagnosed
ampullary carcinoma status post biliary stent placement

EXAM:
CT ABDOMEN AND PELVIS WITH CONTRAST
TECHNIQUE: Multidetector CT imaging of the abdomen and pelvis was performed
using the standard protocol following bolus administration of
intravenous contrast.
CONTRAST:  100mL OMNIPAQUE IOHEXOL 300 MG/ML  SOLN

[Series 2: axial st · axial · 0.81mm/px · z∈[-384,+16]mm · 13 of 90 slices shown, 15 images]
[im 5/90  soft-tissue]
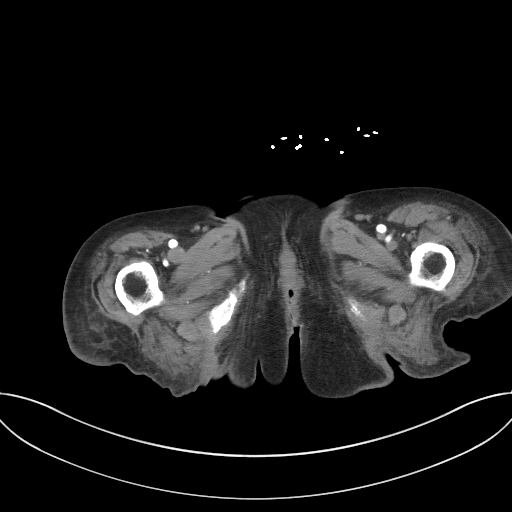
[im 5/90  bone]
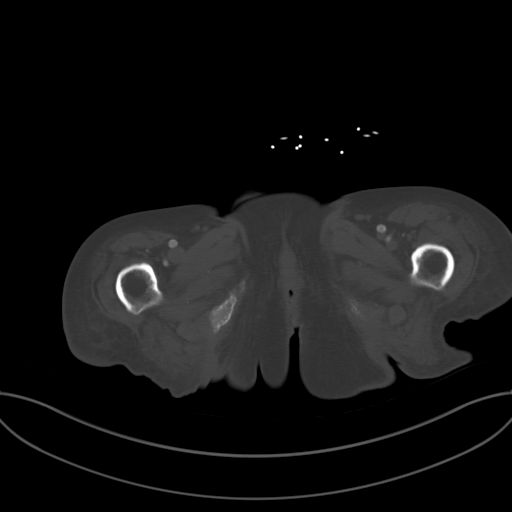
[im 15/90  soft-tissue]
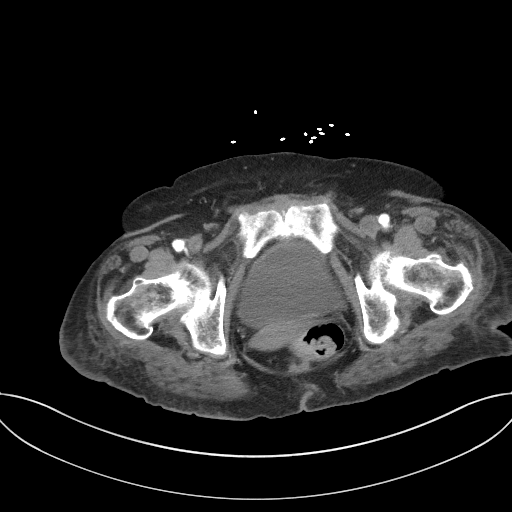
[im 19/90  soft-tissue]
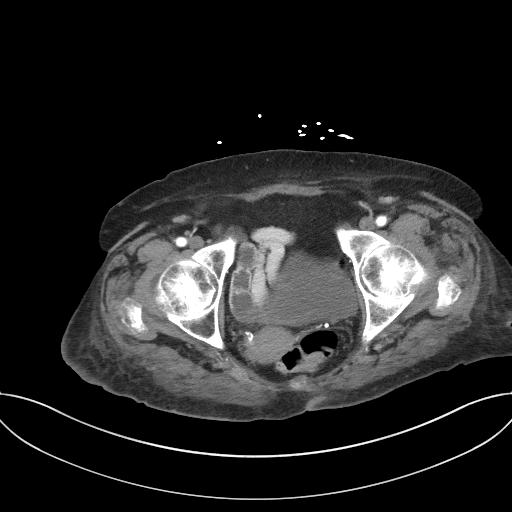
[im 24/90  soft-tissue]
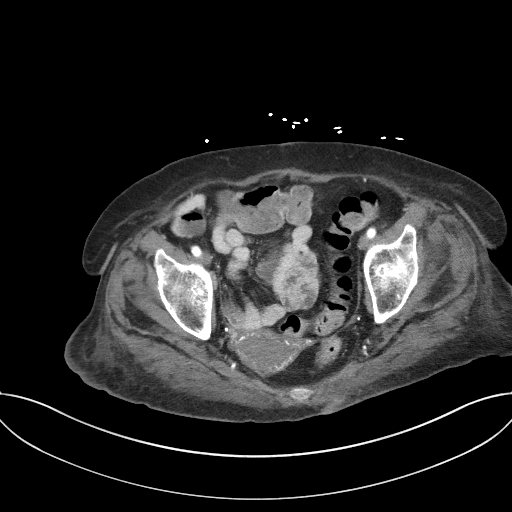
[im 33/90  soft-tissue]
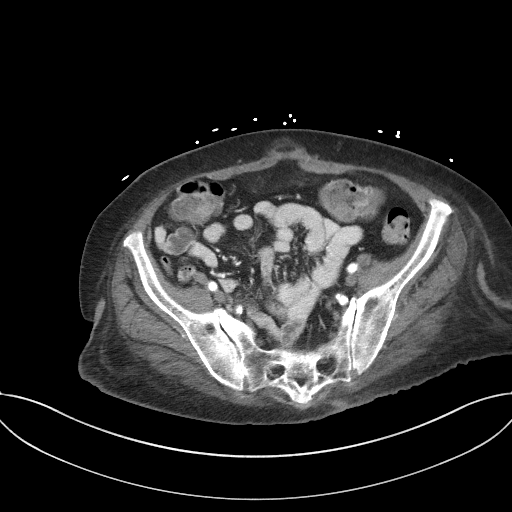
[im 38/90  soft-tissue]
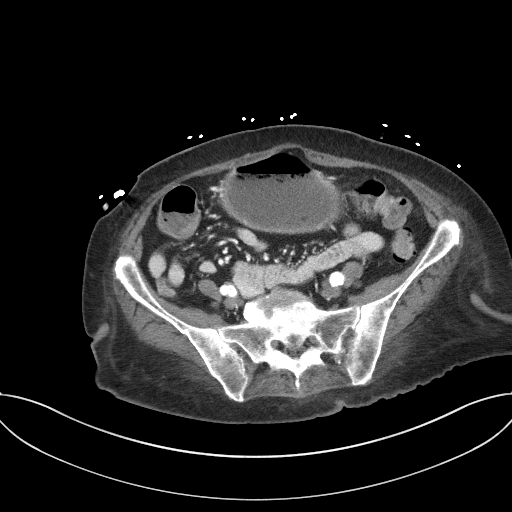
[im 47/90  soft-tissue]
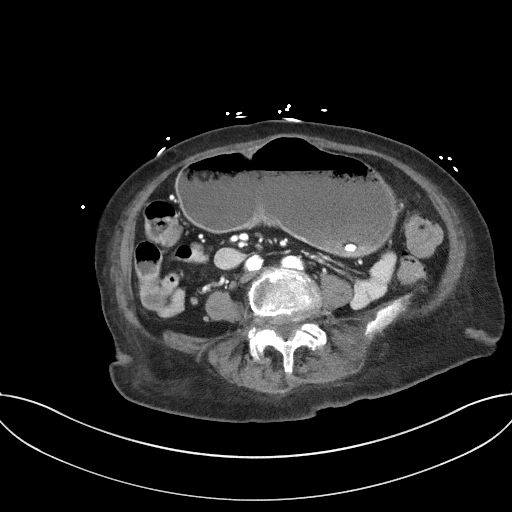
[im 52/90  soft-tissue]
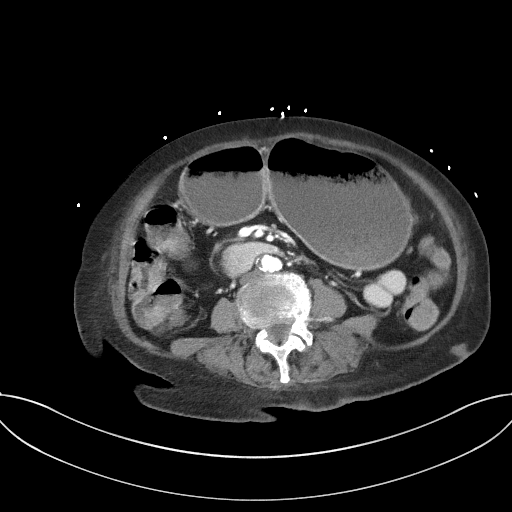
[im 57/90  soft-tissue]
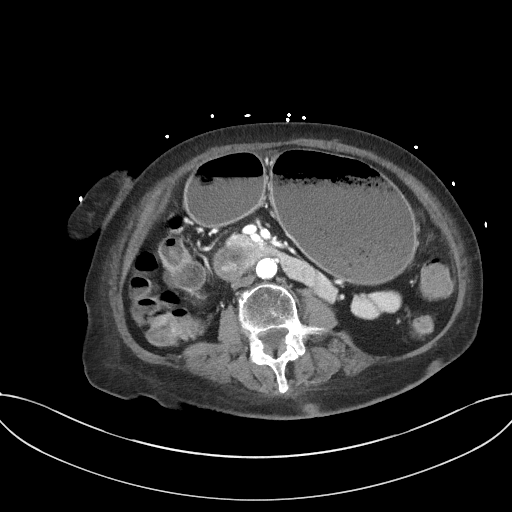
[im 57/90  bone]
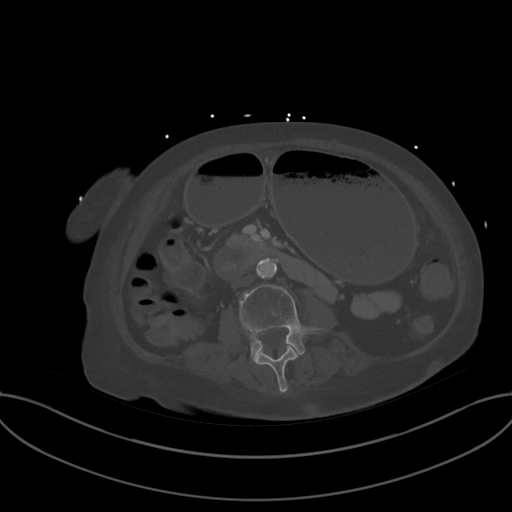
[im 66/90  soft-tissue]
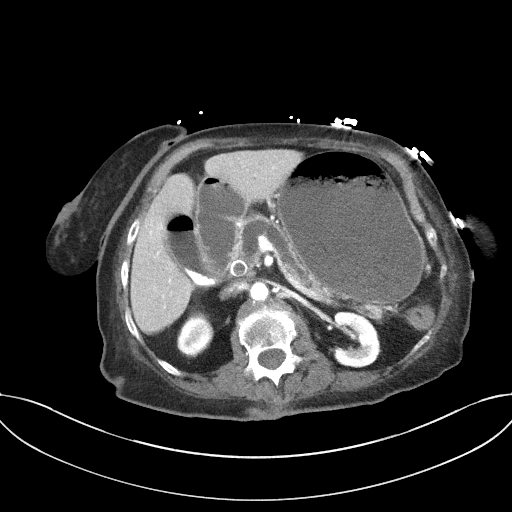
[im 71/90  soft-tissue]
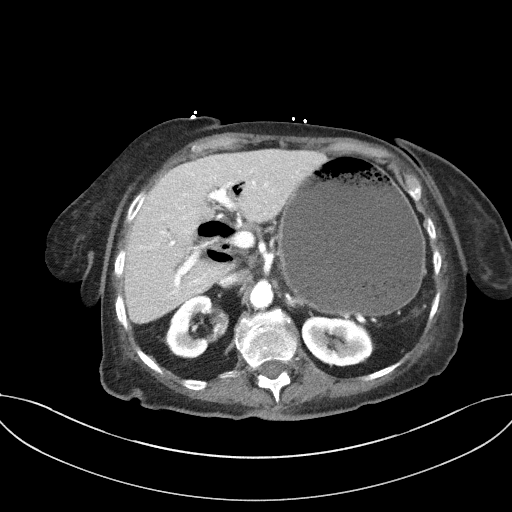
[im 75/90  soft-tissue]
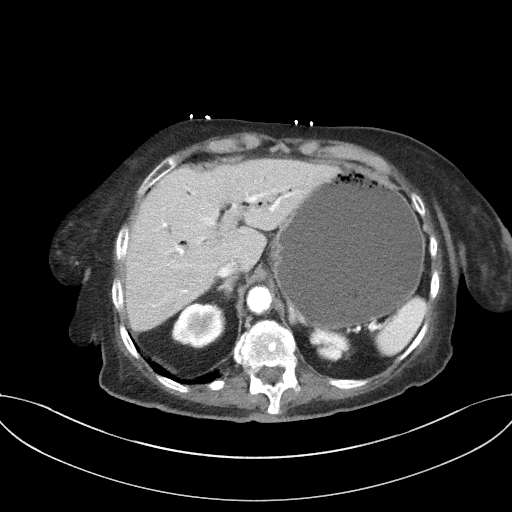
[im 85/90  soft-tissue]
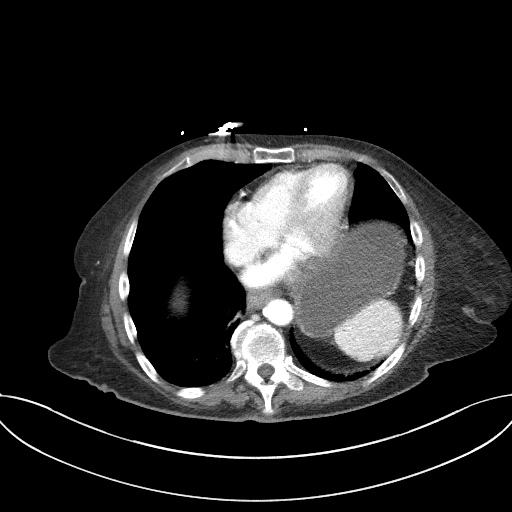

[Series 5: coronal st · coronal · 0.84mm/px · 3 of 100 slices shown]
[im 34/100  soft-tissue]
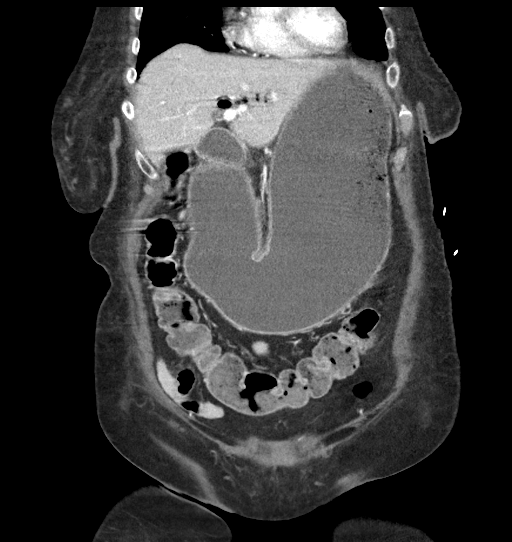
[im 45/100  soft-tissue]
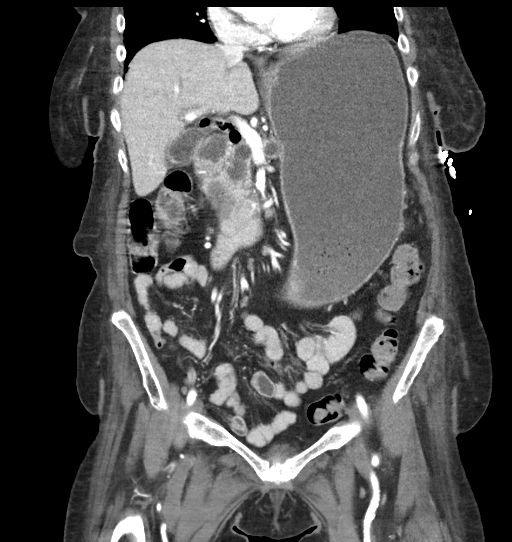
[im 56/100  soft-tissue]
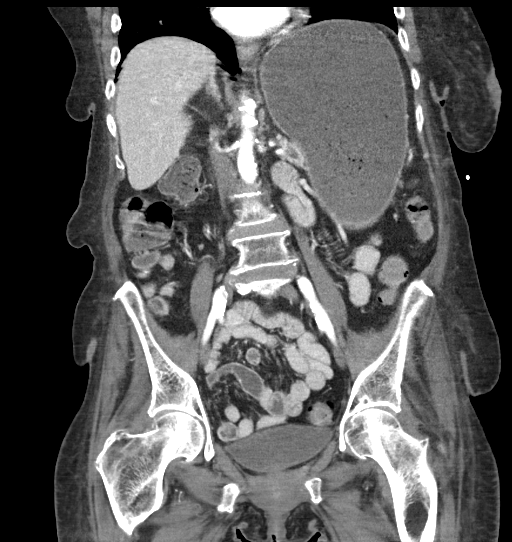

[16 of 46 positions shown; findings below may reference images not displayed]

FINDINGS: Lower chest: Visualized lung bases are clear. Heart size within
normal limits. Coronary artery calcification.

Hepatobiliary: Interval placement of biliary stent within the common
bile duct. Intrahepatic biliary dilatation with postprocedural
pneumobilia. No intrahepatic lesion. Layering stones within the
gallbladder lumen. Small amount of air is also noted within the
gallbladder.

Pancreas: Marked pancreatic ductal dilatation. Ill-defined
low-density mass at the region of the pancreatic head and ampulla
measuring approximately 3.0 x 2.0 cm (series 2, image 34) compatible
with known biopsy-proven ampullary carcinoma. No peripancreatic
inflammatory changes or fluid collection.

Spleen: Normal in size without focal abnormality.

Adrenals/Urinary Tract: Unremarkable adrenal glands. Kidneys enhance
symmetrically without focal lesion, stone, or hydronephrosis.
Ureters are nondilated. Urinary bladder appears unremarkable.

Stomach/Bowel: Stomach is massively distended and fluid-filled. The
duodenal bulb and descending duodenum are distended to the level of
the ampulla. Suspected invasion and obstruction of the duodenum at
the level of the ampulla. The duodenum distal to this level is
collapsed. There is a small amount of fluid within the more distal
small bowel. Mild volume of stool within the colon. No pericolonic
inflammatory changes. A normal appendix is visualized.

Vascular/Lymphatic: Scattered aortoiliac atherosclerotic
calcifications without aneurysm. No abdominopelvic lymphadenopathy.

Reproductive: Uterus and bilateral adnexa are unremarkable.

Other: No ascites. No abdominopelvic fluid collection. No
pneumoperitoneum.

Musculoskeletal: No new or acute osseous findings.
IMPRESSION: 1. Ill-defined low-density mass at the region of the pancreatic head
and ampulla measuring up to 3.0 cm compatible with known
biopsy-proven ampullary carcinoma. Findings suggest
invasion/obstruction of the duodenal lumen at this level. The
stomach and proximal duodenum are markedly dilated upstream from the
level of the ampullary tumor. Findings compatible with mechanical
outlet obstruction.
2. Interval placement of biliary stent within the common bile duct.
Intrahepatic biliary dilatation with postprocedural pneumobilia.
3. Cholelithiasis.
4. Aortic atherosclerosis. ([LH]-[LH]).

These results were called by telephone at the time of interpretation
on [DATE] at [DATE] to provider AHA , who verbally
acknowledged these results.

## 2019-08-15 IMAGING — DX DG ABD PORTABLE 1V
1 series · 1 of 1 positions shown · non-contrast
Comparison: CT abdomen pelvis [DATE]

CLINICAL DATA: NG tube placement

EXAM:
PORTABLE ABDOMEN - 1 VIEW

[abdomen kub]
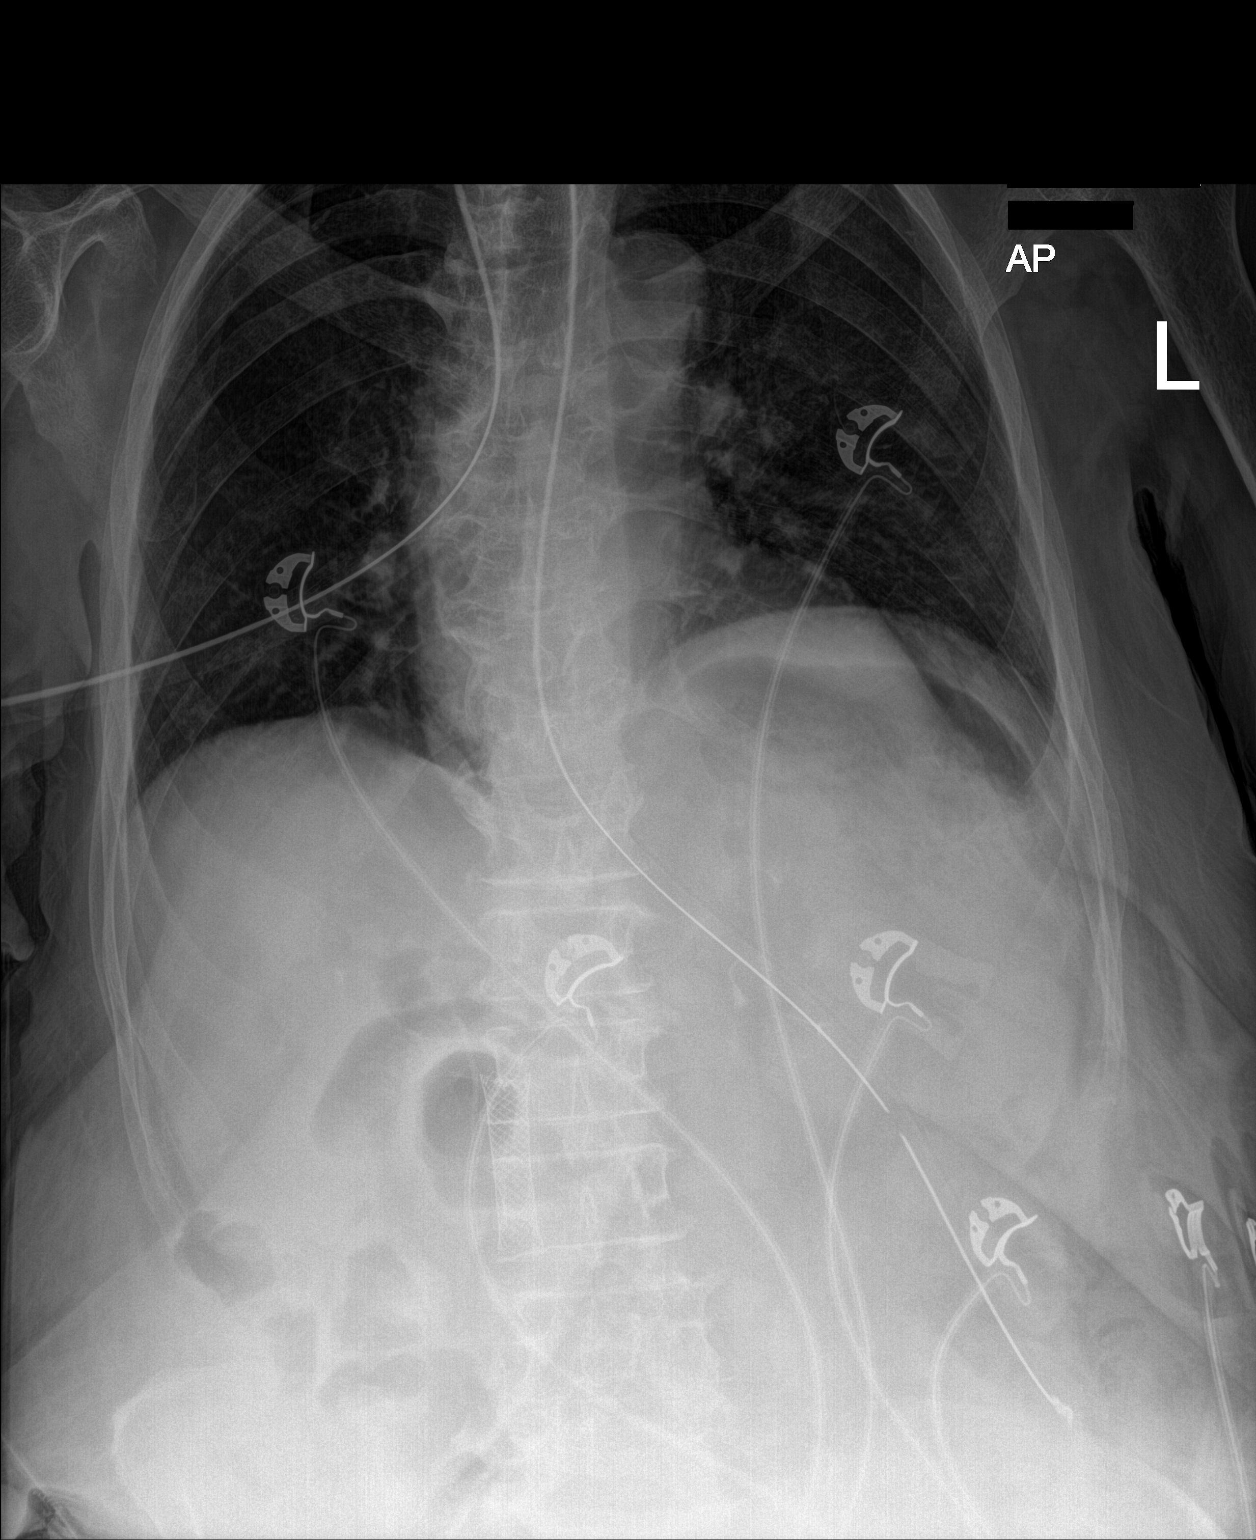

[1 of 1 positions shown; findings below may reference images not displayed]

FINDINGS: Interval placement of a transesophageal tube which terminates in
left mid abdomen possibly within the distended gastric body with the
side port distal to the GE junction. Additional telemetry leads
overlie the chest. Right upper quadrant biliary stent noted as well.
Atelectatic changes in the otherwise clear lung bases.
Cardiomediastinal contours are unremarkable. No evidence of
subdiaphragmatic free air. No suspicious calcifications the imaged
portions of the abdomen. Excreted contrast media in the renal
collecting system. Degenerative changes throughout the imaged spine.
No worrisome osseous lesions.
IMPRESSION: Interval placement of a transesophageal tube which terminates in
left mid abdomen possibly within the distended gastric body with the
side port distal to the GE junction.

## 2019-08-15 MED ORDER — MORPHINE SULFATE (PF) 2 MG/ML IV SOLN
0.5000 mg | INTRAVENOUS | Status: DC | PRN
  Administered 2019-08-17 (×2): 0.5 mg via INTRAVENOUS
  Filled 2019-08-15 (×3): qty 1

## 2019-08-15 MED ORDER — LIDOCAINE HCL URETHRAL/MUCOSAL 2 % EX GEL
CUTANEOUS | Status: AC
  Filled 2019-08-15: qty 20

## 2019-08-15 MED ORDER — LATANOPROST 0.005 % OP SOLN
1.0000 [drp] | Freq: Every day | OPHTHALMIC | Status: DC
  Administered 2019-08-15 – 2019-08-19 (×5): 1 [drp] via OPHTHALMIC
  Filled 2019-08-15: qty 2.5

## 2019-08-15 MED ORDER — PANTOPRAZOLE SODIUM 40 MG IV SOLR
40.0000 mg | Freq: Once | INTRAVENOUS | Status: AC
  Administered 2019-08-15: 40 mg via INTRAVENOUS
  Filled 2019-08-15: qty 40

## 2019-08-15 MED ORDER — MORPHINE SULFATE (PF) 2 MG/ML IV SOLN
2.0000 mg | Freq: Once | INTRAVENOUS | Status: AC
  Administered 2019-08-15: 2 mg via INTRAVENOUS
  Filled 2019-08-15: qty 1

## 2019-08-15 MED ORDER — SODIUM CHLORIDE 0.9 % IV BOLUS
500.0000 mL | Freq: Once | INTRAVENOUS | Status: AC
  Administered 2019-08-15: 500 mL via INTRAVENOUS

## 2019-08-15 MED ORDER — ONDANSETRON HCL 4 MG/2ML IJ SOLN: 4.0000 mg | Freq: Four times a day (QID) | INTRAMUSCULAR | Status: DC | PRN

## 2019-08-15 MED ORDER — ENOXAPARIN SODIUM 40 MG/0.4ML ~~LOC~~ SOLN
40.0000 mg | SUBCUTANEOUS | Status: DC
  Administered 2019-08-16 – 2019-08-20 (×5): 40 mg via SUBCUTANEOUS
  Filled 2019-08-15 (×5): qty 0.4

## 2019-08-15 MED ORDER — SODIUM CHLORIDE 0.9 % IV SOLN: INTRAVENOUS | Status: DC

## 2019-08-15 MED ORDER — PANTOPRAZOLE SODIUM 40 MG IV SOLR
40.0000 mg | INTRAVENOUS | Status: DC
  Administered 2019-08-16 – 2019-08-20 (×5): 40 mg via INTRAVENOUS
  Filled 2019-08-15 (×5): qty 40

## 2019-08-15 MED ORDER — IOHEXOL 300 MG/ML  SOLN
100.0000 mL | Freq: Once | INTRAMUSCULAR | Status: AC | PRN
  Administered 2019-08-15: 100 mL via INTRAVENOUS

## 2019-08-15 MED ORDER — SODIUM CHLORIDE 0.9 % IV SOLN: Freq: Once | INTRAVENOUS | Status: AC

## 2019-08-15 MED ORDER — MORPHINE SULFATE (PF) 2 MG/ML IV SOLN: 2.0000 mg | INTRAVENOUS | Status: DC | PRN

## 2019-08-15 NOTE — ED Notes (Signed)
Date and time results received: 08/15/19 1319   Test:lactic acid Critical Value: 4.9  Name of Provider Notified: Pfeiffer  Orders Received? Or Actions Taken?: no orders given

## 2019-08-15 NOTE — ED Provider Notes (Addendum)
[facility] DEPARTMENT Provider Note   CSN: [id number] Arrival date & time: 08/15/19  1118     History Chief Complaint  Patient presents with  . Abdominal Pain    [name] is a 86 y.o. female.  HPI Patient with history of ampullary adenocarcinoma, cTxN0M0, s/p radiation.  Patient with increasing weight loss and poor appetite over the past several weeks.  Patient reports that as of yesterday she started having fairly severe upper abdominal pain.  She had nausea and vomiting after trying to eat breakfast today.  She has not had any frequent diarrhea or constipation.  Patient daughter reports that sometimes she gets diarrhea associate with certain medications.    Past Medical History:  Diagnosis Date  . CKD (chronic kidney disease)   . Diabetes mellitus without complication ([place])   . Gout   . Hypercholesteremia   . Hypertension   . 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 Goals of care, counseling/discussion 05/29/2019  . Ampullary carcinoma ([name]) 05/29/2019  . Protein-calorie malnutrition, severe 05/20/2019  . Acute cholangitis 05/18/2019  . Unintentional weight loss 05/18/2019  . Slurred speech 05/18/2019  . HLD (hyperlipidemia) 05/18/2019  . HTN (hypertension) 05/18/2019  . DM (diabetes mellitus), type 2 ([name]) 05/18/2019  . Hypokalemia 05/18/2019  . TIA (transient ischemic attack) 05/18/2019  . Elevated bilirubin   . Transaminitis   . Gallstone pancreatitis   . Choledocholithiasis   . Dilated cbd, acquired   . Pancreatic duct dilated   . Lactic acidosis   . Poor appetite 04/13/2019  . Dilation of biliary tract 04/13/2019  . Abnormal CT scan, gallbladder 04/13/2019  . Abnormal weight loss 04/13/2019    Past Surgical History:  Procedure Laterality Date  . BILIARY STENT PLACEMENT  05/20/2019   Procedure: BILIARY STENT PLACEMENT;  Surgeon: Milus Banister, MD;   Location: [facility] ENDOSCOPY;  Service: Endoscopy;;  . BIOPSY  05/20/2019   Procedure: BIOPSY;  Surgeon: Milus Banister, MD;  Location: [facility] ENDOSCOPY;  Service: Endoscopy;;  . ERCP N/A 05/20/2019   Procedure: ENDOSCOPIC RETROGRADE CHOLANGIOPANCREATOGRAPHY (ERCP);  Surgeon: Milus Banister, MD;  Location: [facility] ENDOSCOPY;  Service: Endoscopy;  Laterality: N/A;  . TOTAL KNEE ARTHROPLASTY Left      OB History    Gravida  5   Para  5   Term  5   Preterm      AB      Living        SAB      TAB      Ectopic      Multiple      Live Births              Family History  Problem Relation Age of Onset  . Diabetes Mother   . Hypertension Father   . Breast cancer Niece   . Colon cancer Neg Hx     Social History   Tobacco Use  . Smoking status: Never Smoker  . Smokeless tobacco: Never Used  Substance Use Topics  . Alcohol use: No  . Drug use: No    Home Medications Prior to Admission medications   Medication Sig Start Date End Date Taking? Authorizing Provider  ACCU-CHEK GUIDE test strip 2 (two) times daily. use for testing 05/23/19   [provider]  Accu-Chek Softclix Lancets lancets USE TWICE DAILY with strips 05/23/19   [provider]  aspirin EC 81 MG tablet Take 81 mg by mouth daily.    [provider]  Blood Glucose Monitoring Suppl (ACCU-CHEK GUIDE) w/Device KIT USE AS DIRECTED TO test TWICE DAILY 05/23/19   [provider]  hydrochlorothiazide (HYDRODIURIL) 25 MG tablet Take 25 mg by mouth daily. 06/10/19   [provider]  Insulin Infusion Pump (ACCU-CHEK COMBO) KIT 1 application by Does not apply route 2 (two) times daily. 05/23/19   Regalado, Belkys A, MD  latanoprost (XALATAN) 0.005 % ophthalmic solution Place 1 drop into both eyes at bedtime.     [provider]  megestrol (MEGACE ES) 625 MG/5ML suspension Take 5 mLs (625 mg total) by mouth daily. 06/30/19   Debra Merle, MD  metFORMIN (GLUCOPHAGE-XR) 500 MG 24 hr tablet  Take 500 mg by mouth 2 (two) times daily.    [provider]  mirtazapine (REMERON) 7.5 MG tablet Take 1 tablet (7.5 mg total) by mouth at bedtime. 05/29/19   Heilingoetter, Cassandra L, PA-C  omeprazole (PRILOSEC) 20 MG capsule Take 1 capsule (20 mg total) by mouth daily. 07/29/19   Debra Merle, MD  potassium chloride (KLOR-CON) 10 MEQ tablet Take 1 tablet (10 mEq total) by mouth daily. 07/29/19   Debra Merle, MD  senna (SENOKOT) 8.6 MG TABS tablet Take 1 tablet (8.6 mg total) by mouth at bedtime as needed for mild constipation. 05/23/19   Regalado, Belkys A, MD  triamcinolone (KENALOG) 0.025 % cream Apply 1 application topically 2 (two) times daily as needed. 03/23/19   [provider]    Allergies    Other  Review of Systems   Review of Systems 10 systems reviewed negative except as per HPI Physical Exam Updated Vital Signs BP (!) 136/70   Pulse 105   Temp 98.9 F (37.2 C) (Oral)   Resp 18   Ht _0  (1.651 m)   Wt 63 kg   SpO2 100%   BMI 23.13 kg/m   Physical Exam Constitutional:      Comments: Nontoxic and alert.  Giving full history.  No respiratory distress.  HENT:     Head: Normocephalic and atraumatic.  Eyes:     Extraocular Movements: Extraocular movements intact.  Cardiovascular:     Rate and Rhythm: Normal rate and regular rhythm.  Pulmonary:     Effort: Pulmonary effort is normal.     Breath sounds: Normal breath sounds.  Abdominal:     Comments: Moderate to severe epigastric and upper abdominal pain.  No guarding.  Lower abdomen nontender.  Musculoskeletal:        General: No swelling or tenderness. Normal range of motion.     Right lower leg: No edema.  Skin:    General: Skin is warm and dry.  Neurological:     General: No focal deficit present.     Mental Status: She is oriented to person, place, and time.     Coordination: Coordination normal.     ED Results / Procedures / Treatments   Labs (all labs ordered are listed, but only abnormal  results are displayed) Labs Reviewed  COMPREHENSIVE METABOLIC PANEL - Abnormal; Notable for the following components:      Result Value   Sodium 131 (*)    Potassium 3.3 (*)    Chloride 93 (*)    Glucose, Bld 150 (*)    Calcium 8.7 (*)    Albumin 2.9 (*)    All other components within normal limits  LACTIC ACID, PLASMA -  Abnormal; Notable for the following components:   Lactic Acid, Venous 4.9 (*)    All other components within normal limits  CBC WITH DIFFERENTIAL/PLATELET - Abnormal; Notable for the following components:   HCT 35.7 (*)    Platelets 418 (*)    All other components within normal limits  LIPASE, BLOOD  PROTIME-INR  LACTIC ACID, PLASMA  URINALYSIS, ROUTINE W REFLEX MICROSCOPIC    EKG EKG Interpretation  Date/Time:  Saturday August 15 2019 11:50:49 EDT Ventricular Rate:  104 PR Interval:    QRS Duration: 101 QT Interval:  338 QTC Calculation: 445 R Axis:   -41 Text Interpretation: Sinus tachycardia Borderline prolonged PR interval LVH with secondary repolarization abnormality Anterior Q waves, possibly due to LVH no sig change from previous Confirmed by Charlesetta Shanks [phone]) on 08/15/2019 1:56:05 PM   Radiology CT Abdomen Pelvis W Contrast  Result Date: 08/15/2019 CLINICAL DATA:  Abdominal pain. 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 CONTRAST:  138m OMNIPAQUE IOHEXOL 300 MG/ML  SOLN COMPARISON:  CT 05/18/2019 FINDINGS: Lower chest: Visualized lung bases are clear. Heart size within normal limits. Coronary artery calcification. Hepatobiliary: Interval placement of biliary stent within the common bile duct. Intrahepatic biliary dilatation with postprocedural pneumobilia. No intrahepatic lesion. Layering stones within the  gallbladder lumen. Small amount of air is also noted within the gallbladder. Pancreas: Marked pancreatic ductal dilatation. Ill-defined low-density mass at the region of the pancreatic head and ampulla measuring approximately 3.0 x 2.0 cm (series 2, image 34) compatible with known biopsy-proven ampullary carcinoma. No peripancreatic inflammatory changes or fluid collection. Spleen: Normal in size without focal abnormality. Adrenals/Urinary Tract: Unremarkable adrenal glands. Kidneys enhance symmetrically without focal lesion, stone, or hydronephrosis. Ureters are nondilated. Urinary bladder appears unremarkable. Stomach/Bowel: Stomach is massively distended and fluid-filled. The duodenal bulb and descending duodenum are distended to the level of the ampulla. Suspected invasion and obstruction of the duodenum at the level of the ampulla. The duodenum distal to this level is collapsed. There is a small amount of fluid within the more distal small bowel. Mild volume of stool within the colon. No pericolonic inflammatory changes. A normal appendix is visualized. Vascular/Lymphatic: Scattered aortoiliac atherosclerotic calcifications without aneurysm. No abdominopelvic lymphadenopathy. Reproductive: Uterus and bilateral adnexa are unremarkable. Other: No ascites. No abdominopelvic fluid collection. No pneumoperitoneum. Musculoskeletal: No new or acute osseous findings. IMPRESSION: 1. Ill-defined low-density mass at the region of the pancreatic head and ampulla measuring up to 3.0 cm compatible with known biopsy-proven ampullary carcinoma. Findings suggest invasion/obstruction of the duodenal lumen at this level. The stomach and proximal duodenum are markedly dilated upstream from the level of the ampullary tumor. Findings compatible with mechanical outlet obstruction. 2. Interval placement of biliary stent within the common bile duct. Intrahepatic biliary dilatation with postprocedural pneumobilia. 3. Cholelithiasis. 4.  Aortic atherosclerosis. (ICD10-I70.0). These results were called by telephone at the time of interpretation on 08/15/2019 at 2:52 pm [facility] who verbally acknowledged these results. Electronically Signed   By: NDavina PokeD.O.   On: 08/15/2019 14:53    Procedures Procedures (including critical care time)  Medications Ordered in ED Medications  morphine 2 MG/ML injection 2 mg (2 mg Intravenous Given 08/15/19 1247)  pantoprazole (PROTONIX) injection 40 mg (40 mg Intravenous Given 08/15/19 1246)  0.9 %  sodium chloride infusion ( Intravenous New Bag/Given 08/15/19 1245)  iohexol (OMNIPAQUE) 300 MG/ML solution 100 mL (100 mLs Intravenous Contrast Given 08/15/19 1357)  sodium chloride 0.9 % bolus 500 mL (500 mLs Intravenous New Bag/Given 08/15/19 1442)    ED Course  I have reviewed the triage vital signs and the nursing notes.  Pertinent labs & imaging results that were available during my care of the patient were reviewed by me and considered in my medical decision making (see chart for details).    MDM Rules/Calculators/A&P                         Consult: [facility] hospitalist for admission.  Presents with increasing pain in her upper abdomen.  She is also now developed vomiting.  She has had weight loss and known history of pancreatic cancer.  She comes in today however for a significant increase in pain severity.  CT shows duodenal obstruction suspected to be due to pancreatic mass.  Patient is treated for pain.  Will have NG tube placed to decompress stomach and proximal small bowel.  Plan for admission for therapeutic/symptomatic management of complications of pancreatic cancer. Final Clinical Impression(s) / ED Diagnoses Final diagnoses:  Small bowel obstruction ([facility])  Malignant neoplasm of head of pancreas [facility])    Rx / DC Orders ED Discharge Orders    None       Charlesetta Shanks, MD 08/15/19 1503    Charlesetta Shanks, MD 08/15/19 1540

## 2019-08-15 NOTE — H&P (Signed)
History and Physical    Debra [name] QQP:[id number] DOB: Apr 28, 1930 DOA: 08/15/2019  PCP: Carylon Perches, MD  Patient coming from: [facility]  I have personally briefly reviewed patient's old medical records in [facility]  Chief Complaint: nausea   HPI: Debra [name] is a 86 y.o. female with medical history significant for Ampullary carinoma and s/p CBD stent in 05/2019 s/p radiation, hypertension, type 2 diabetes, TIA and hyperlipidemia who presents with concerns of nausea and vomiting.  Patient has been noting some sharp umbilical pain for the past several days and then today had an episode of vomiting.  She decided to present to med [facility] ED since her oncologist has told her in the past to be evaluated if any vomiting occurs.  Otherwise, she denies any fever or diarrhea.  Patient has history of ampullary carcinoma with CBD stent and just finished radiation in June. CT abdomen pelvis in the ED was significant for finding  suggestive of invasive/obstruction of the duodenum from the ampulla carcinoma.  Unfortunately, per oncology documentation patient not a candidate for surgery or chemotherapy due to advanced age and multiple comorbidities including dementia. Dr.feng is her primary oncologist. Currently she has a home palliative care nurse and family is actively trying to select one HCPOA since patient has dementia.  Labs otherwise showed no leukocytosis with mild thrombocytosis of 418.  Lactic acid of 4.9 down to 3.9 with 500cc NS fluids. Sodium of 131, glucose of 150, K of 3.3, normal creatinine 0.79, calcium 8.7, albumin 2.9.  NG tube was inserted and patient was requested to be transferred to [facility] long from med [facility] ED.    Review of Systems:  Constitutional: No Weight Change, No Fever ENT/Mouth: No sore throat, No Rhinorrhea Eyes: No Eye Pain, No Vision Changes Cardiovascular: No Chest Pain, no SOB Respiratory: No Cough, No Sputum, No Wheezing, no  Dyspnea  Gastrointestinal: + Nausea,+ Vomiting, No Diarrhea, No Constipation, + Pain Genitourinary: no Urinary Incontinence, No Urgency, No Flank Pain Musculoskeletal: No Arthralgias, No Myalgias Skin: No Skin Lesions, No Pruritus, Neuro: no Weakness, No Numbness Psych: No Anxiety/Panic, No Depression Heme/Lymph: No Bruising, No Bleeding  Past Medical History:  Diagnosis Date  . CKD (chronic kidney disease)   . Diabetes mellitus without complication (HCC)   . Gout   . Hypercholesteremia   . Hypertension   . 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 BILIARY STENT PLACEMENT  05/20/2019   Procedure: BILIARY STENT PLACEMENT;  Surgeon: Rachael Fee, MD;  Location: [facility] ENDOSCOPY;  Service: Endoscopy;;  . BIOPSY  05/20/2019   Procedure: BIOPSY;  Surgeon: Rachael Fee, MD;  Location: [facility] ENDOSCOPY;  Service: Endoscopy;;  . ERCP N/A 05/20/2019   Procedure: ENDOSCOPIC RETROGRADE CHOLANGIOPANCREATOGRAPHY (ERCP);  Surgeon: Rachael Fee, MD;  Location: [facility] ENDOSCOPY;  Service: Endoscopy;  Laterality: N/A;  . TOTAL KNEE ARTHROPLASTY Left      reports that she has never smoked. She has never used smokeless tobacco. She reports that she does not drink alcohol and does not use drugs. Social History  Allergies  Allergen Reactions  . Other Nausea And Vomiting    Diet Sodas. Sugar-free jello    Family History  Problem Relation Age of Onset  . Diabetes Mother   . Hypertension Father   . Breast cancer Niece   . Colon cancer Neg Hx      Prior to Admission  medications   Medication Sig Start Date End Date Taking? Authorizing Provider  ACCU-CHEK GUIDE test strip 2 (two) times daily. use for testing 05/23/19   [provider]  Accu-Chek Softclix Lancets lancets USE TWICE DAILY with strips 05/23/19   [provider]  aspirin EC 81 MG tablet Take 81 mg by mouth daily.    [provider]  Blood Glucose  Monitoring Suppl (ACCU-CHEK GUIDE) w/Device KIT USE AS DIRECTED TO test TWICE DAILY 05/23/19   [provider]  hydrochlorothiazide (HYDRODIURIL) 25 MG tablet Take 25 mg by mouth daily. 06/10/19   [provider]  Insulin Infusion Pump (ACCU-CHEK COMBO) KIT 1 application by Does not apply route 2 (two) times daily. 05/23/19   Regalado, Belkys A, MD  latanoprost (XALATAN) 0.005 % ophthalmic solution Place 1 drop into both eyes at bedtime.     [provider]  megestrol (MEGACE ES) 625 MG/5ML suspension Take 5 mLs (625 mg total) by mouth daily. 06/30/19   Truitt Merle, MD  metFORMIN (GLUCOPHAGE-XR) 500 MG 24 hr tablet Take 500 mg by mouth 2 (two) times daily.    [provider]  mirtazapine (REMERON) 7.5 MG tablet Take 1 tablet (7.5 mg total) by mouth at bedtime. 05/29/19   Heilingoetter, Cassandra L, PA-C  omeprazole (PRILOSEC) 20 MG capsule Take 1 capsule (20 mg total) by mouth daily. 07/29/19   Truitt Merle, MD  potassium chloride (KLOR-CON) 10 MEQ tablet Take 1 tablet (10 mEq total) by mouth daily. 07/29/19   Truitt Merle, MD  senna (SENOKOT) 8.6 MG TABS tablet Take 1 tablet (8.6 mg total) by mouth at bedtime as needed for mild constipation. 05/23/19   Regalado, Belkys A, MD  triamcinolone (KENALOG) 0.025 % cream Apply 1 application topically 2 (two) times daily as needed. 03/23/19   [provider]    Physical Exam: Vitals:   08/15/19 1700 08/15/19 1839 08/15/19 1941 08/15/19 2037  BP: (!) 137/74 (!) 120/50 113/66 (!) 137/98  Pulse: 103 104 99 (!) 106  Resp:  18 (!) 25 19  Temp:   98.2 F (36.8 C) (!) 97.5 F (36.4 C)  TempSrc:   Oral Oral  SpO2: 99% 100% 100% (!) 82%  Weight:      Height:        Constitutional: NAD, calm, comfortable, elderly female sitting at 40 degree incline in bed Vitals:   08/15/19 1700 08/15/19 1839 08/15/19 1941 08/15/19 2037  BP: (!) 137/74 (!) 120/50 113/66 (!) 137/98  Pulse: 103 104 99 (!) 106  Resp:  18 (!) 25 19  Temp:   98.2  F (36.8 C) (!) 97.5 F (36.4 C)  TempSrc:   Oral Oral  SpO2: 99% 100% 100% (!) 82%  Weight:      Height:       Eyes: PERRL, lids and conjunctivae normal ENMT: Mucous membranes are moist.  NG tube in place to suction. Neck: normal, supple, Respiratory: clear to auscultation bilaterally, no wheezing, no crackles. Normal respiratory effort. No accessory muscle use.  Cardiovascular: Regular rate and rhythm, no murmurs / rubs / gallops. No extremity edema.  Abdomen: no tenderness but had received morphine earlier today, no masses palpated. Bowel sounds positive.  Musculoskeletal: no clubbing / cyanosis. No joint deformity upper and lower extremities. Good ROM, no contractures. Normal muscle tone.  Skin: no rashes, lesions, ulcers. No induration Neurologic: CN 2-12 grossly intact. Sensation intact. Strength 5/5 in all 4.  Psychiatric: Normal judgment and insight. Alert and oriented x  3.  Pleasant mood.     Labs on Admission: I have personally reviewed following labs and imaging studies  CBC: Recent Labs  Lab 08/15/19 1239  WBC 6.5  NEUTROABS 3.3  HGB 12.1  HCT 35.7*  MCV 89.5  PLT 353*   Basic Metabolic Panel: Recent Labs  Lab 08/15/19 1239  NA 131*  K 3.3*  CL 93*  CO2 23  GLUCOSE 150*  BUN 12  CREATININE 0.79  CALCIUM 8.7*   GFR: Estimated Creatinine Clearance: 44.6 mL/min (by C-G formula based on SCr of 0.79 mg/dL). Liver Function Tests: Recent Labs  Lab 08/15/19 1239  AST 26  ALT 14  ALKPHOS 74  BILITOT 0.5  PROT 6.9  ALBUMIN 2.9*   Recent Labs  Lab 08/15/19 1239  LIPASE 47   No results for input(s): AMMONIA in the last 168 hours. Coagulation Profile: Recent Labs  Lab 08/15/19 1239  INR 1.0   Cardiac Enzymes: No results for input(s): CKTOTAL, CKMB, CKMBINDEX, TROPONINI in the last 168 hours. BNP (last 3 results) No results for input(s): PROBNP in the last 8760 hours. HbA1C: No results for input(s): HGBA1C in the last 72 hours. CBG: No  results for input(s): GLUCAP in the last 168 hours. Lipid Profile: No results for input(s): CHOL, HDL, LDLCALC, TRIG, CHOLHDL, LDLDIRECT in the last 72 hours. Thyroid Function Tests: No results for input(s): TSH, T4TOTAL, FREET4, T3FREE, THYROIDAB in the last 72 hours. Anemia Panel: No results for input(s): VITAMINB12, FOLATE, FERRITIN, TIBC, IRON, RETICCTPCT in the last 72 hours. Urine analysis:    Component Value Date/Time   COLORURINE YELLOW 08/15/2019 1519   APPEARANCEUR CLEAR 08/15/2019 1519   LABSPEC 1.010 08/15/2019 1519   PHURINE 8.5 (H) 08/15/2019 1519   GLUCOSEU NEGATIVE 08/15/2019 1519   HGBUR NEGATIVE 08/15/2019 1519   BILIRUBINUR NEGATIVE 08/15/2019 1519   KETONESUR NEGATIVE 08/15/2019 1519   PROTEINUR NEGATIVE 08/15/2019 1519   NITRITE NEGATIVE 08/15/2019 1519   LEUKOCYTESUR NEGATIVE 08/15/2019 1519    Radiological Exams on Admission: CT Abdomen Pelvis W Contrast  Result Date: 08/15/2019 CLINICAL DATA:  Abdominal pain. 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 CONTRAST:  182m OMNIPAQUE IOHEXOL 300 MG/ML  SOLN COMPARISON:  CT 05/18/2019 FINDINGS: Lower chest: Visualized lung bases are clear. Heart size within normal limits. Coronary artery calcification. Hepatobiliary: Interval placement of biliary stent within the common bile duct. Intrahepatic biliary dilatation with postprocedural pneumobilia. No intrahepatic lesion. Layering stones within the gallbladder lumen. Small amount of air is also noted within the gallbladder. Pancreas: Marked pancreatic ductal dilatation. Ill-defined low-density mass at the region of the pancreatic head and ampulla measuring approximately 3.0 x 2.0 cm (series 2, image 34) compatible with known  biopsy-proven ampullary carcinoma. No peripancreatic inflammatory changes or fluid collection. Spleen: Normal in size without focal abnormality. Adrenals/Urinary Tract: Unremarkable adrenal glands. Kidneys enhance symmetrically without focal lesion, stone, or hydronephrosis. Ureters are nondilated. Urinary bladder appears unremarkable. Stomach/Bowel: Stomach is massively distended and fluid-filled. The duodenal bulb and descending duodenum are distended to the level of the ampulla. Suspected invasion and obstruction of the duodenum at the level of the ampulla. The duodenum distal to this level is collapsed. There is a small amount of fluid within the more distal small bowel. Mild volume of stool within the colon. No pericolonic inflammatory changes. A  normal appendix is visualized. Vascular/Lymphatic: Scattered aortoiliac atherosclerotic calcifications without aneurysm. No abdominopelvic lymphadenopathy. Reproductive: Uterus and bilateral adnexa are unremarkable. Other: No ascites. No abdominopelvic fluid collection. No pneumoperitoneum. Musculoskeletal: No new or acute osseous findings. IMPRESSION: 1. Ill-defined low-density mass at the region of the pancreatic head and ampulla measuring up to 3.0 cm compatible with known biopsy-proven ampullary carcinoma. Findings suggest invasion/obstruction of the duodenal lumen at this level. The stomach and proximal duodenum are markedly dilated upstream from the level of the ampullary tumor. Findings compatible with mechanical outlet obstruction. 2. Interval placement of biliary stent within the common bile duct. Intrahepatic biliary dilatation with postprocedural pneumobilia. 3. Cholelithiasis. 4. Aortic atherosclerosis. (ICD10-I70.0). These results were called by telephone at the time of interpretation on 08/15/2019 at 2:52 pm to provider [facility] , who verbally acknowledged these results. Electronically Signed   By: Davina Poke D.O.   On: 08/15/2019 14:53   DG  Abd Portable 1V  Result Date: 08/15/2019 CLINICAL DATA:  NG tube placement EXAM: PORTABLE ABDOMEN - 1 VIEW COMPARISON:  CT abdomen pelvis 08/15/2019 FINDINGS: Interval placement of a transesophageal tube which terminates in left mid abdomen possibly within the distended gastric body with the side port distal to the GE junction. Additional telemetry leads overlie the chest. Right upper quadrant biliary stent noted as well. Atelectatic changes in the otherwise clear lung bases. Cardiomediastinal contours are unremarkable. No evidence of subdiaphragmatic free air. No suspicious calcifications the imaged portions of the abdomen. Excreted contrast media in the renal collecting system. Degenerative changes throughout the imaged spine. No worrisome osseous lesions. IMPRESSION: Interval placement of a transesophageal tube which terminates in left mid abdomen possibly within the distended gastric body with the side port distal to the GE junction. Electronically Signed   By: Lovena Le M.D.   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  Will keep n.p.o. PRN antiemetic PRN pain control  Consult oncology in the morning Discussed at length with daughter at bedside regarding patient's wishes should NG tube decompression not be successful.  Daughter would like to speak with other family members as well as her primary oncologist Dr. Annamaria Boots in the morning.  Elevated lactate Continue to trend. Continous IV fluid 75cc/hr  Mild hyponatremia/hypokalemia Due to vomiting and decreased p.o. intake Follow with repeat BMP in the morning with continuous fluid  Mild thrombocytosis Reactive.  Continue to monitor with CBC  HTN Stable. Resume antihypertensives when able to tolerate p.o.  Type 2 diabetes Controlled. Monitor without sliding scale while NPO  DVT prophylaxis:.Lovenox Code Status: Full-pt continues to want to be full  code and daughter agrees Family Communication: Plan discussed with patient and daughter at bedside  disposition Plan: Home with at least 2 midnight stays  Consults called: Needs oncology consult  Admission status: inpatient  Status is: Inpatient  Remains inpatient appropriate because:Inpatient level of care appropriate due to severity of illness   Dispo: The patient is from: Home              Anticipated d/c is to: Home              Anticipated d/c date is: 2 days              Patient currently is not medically stable to d/c.         Orene Desanctis DO [facility]   If 7PM-7AM, please contact night-coverage  [email or web address]   08/15/2019, 9:58 PM

## 2019-08-15 NOTE — ED Notes (Signed)
Lactic Acid 3.9, results given to ED MD

## 2019-08-15 NOTE — ED Notes (Signed)
megestrol

## 2019-08-15 NOTE — ED Triage Notes (Signed)
Pt arrives POV with daughter, c/o mid abdominal pain and N/V after breakfast this am around 0730. Pt endorse pancreatic cancer. Pt denies diarrhea

## 2019-08-16 DIAGNOSIS — K56609 Unspecified intestinal obstruction, unspecified as to partial versus complete obstruction: Secondary | ICD-10-CM

## 2019-08-16 DIAGNOSIS — C250 Malignant neoplasm of head of pancreas: Secondary | ICD-10-CM

## 2019-08-16 DIAGNOSIS — R1010 Upper abdominal pain, unspecified: Secondary | ICD-10-CM

## 2019-08-16 LAB — BASIC METABOLIC PANEL
Anion gap: 11 (ref 5–15)
BUN: 10 mg/dL (ref 8–23)
CO2: 25 mmol/L (ref 22–32)
Calcium: 8.5 mg/dL — ABNORMAL LOW (ref 8.9–10.3)
Chloride: 99 mmol/L (ref 98–111)
Creatinine, Ser: 0.69 mg/dL (ref 0.44–1.00)
GFR calc Af Amer: >60 mL/min (ref >60)
GFR calc non Af Amer: >60 mL/min (ref >60)
Glucose, Bld: 99 mg/dL (ref 70–99)
Potassium: 3.2 mmol/L — ABNORMAL LOW (ref 3.5–5.1)
Sodium: 135 mmol/L (ref 135–145)

## 2019-08-16 LAB — CBC
HCT: 33.3 % — ABNORMAL LOW (ref 36.0–46.0)
Hemoglobin: 11.2 g/dL — ABNORMAL LOW (ref 12.0–15.0)
MCH: 30.4 pg (ref 26.0–34.0)
MCHC: 33.6 g/dL (ref 30.0–36.0)
MCV: 90.5 fL (ref 80.0–100.0)
Platelets: 368 10*3/uL (ref 150–400)
RBC: 3.68 MIL/uL — ABNORMAL LOW (ref 3.87–5.11)
RDW: 14.2 % (ref 11.5–15.5)
WBC: 6.4 10*3/uL (ref 4.0–10.5)
nRBC: 0 % (ref 0.0–0.2)

## 2019-08-16 LAB — LACTIC ACID, PLASMA: Lactic Acid, Venous: 1.5 mmol/L (ref 0.5–1.9)

## 2019-08-16 LAB — MAGNESIUM: Magnesium: 1.2 mg/dL — ABNORMAL LOW (ref 1.7–2.4)

## 2019-08-16 MED ORDER — POTASSIUM CHLORIDE 2 MEQ/ML IV SOLN
INTRAVENOUS | Status: DC
  Filled 2019-08-16 (×3): qty 1000

## 2019-08-16 MED ORDER — MAGNESIUM SULFATE 4 GM/100ML IV SOLN
4.0000 g | Freq: Once | INTRAVENOUS | Status: AC
  Administered 2019-08-16: 4 g via INTRAVENOUS
  Filled 2019-08-16: qty 100

## 2019-08-16 NOTE — Plan of Care (Signed)
  Problem: Pain Managment: Goal: General experience of comfort will improve Outcome: Progressing   

## 2019-08-16 NOTE — Progress Notes (Signed)
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  She has been having worsening abdominal pain and dementia lately, she presented to the hospital yesterday for worsening abdominal pain, nausea and vomiting.  CT scan showed gastric outlet obstruction from the ampullary tumor.  NG tube was placed, large volume liquid was removed and she feels much better today.  Her 2 daughters were at bedside.   Objective:  Vitals:   84/01/21 0533 84/01/21 0910  BP: 120/79 (!) 115/61  Pulse: 103 92  Resp: 17 16  Temp: 98.3 F (36.8 C) 98.7 F (37.1 C)  SpO2: 100% 100%    Body mass index is 23.13 kg/m.  Intake/Output Summary (Last 24 hours) at 84/01/2019 1057 Last data filed at 84/01/2019 1000 Gross per 24 hour  Intake 865.14 ml  Output 2351 ml  Net -1485.86 ml     Sclerae unicteric  Oropharynx clear  No peripheral adenopathy  Lungs clear -- no rales or rhonchi  Heart regular rate and rhythm  Abdomen soft and nontender   MSK no focal spinal tenderness, no peripheral edema  Neuro nonfocal   CBG (last 3)  No results for input(s): GLUCAP in the last 72 hours.   Labs:  Urine Studies No results for input(s): UHGB, CRYS in the last 72 hours.  Invalid input(s): UACOL, UAPR, USPG, UPH, UTP, UGL, UKET, UBIL, UNIT, UROB, ULEU, UEPI, UWBC, URBC, UBAC, CAST, UCOM, BILUA  Basic Metabolic Panel: Recent Labs  Lab 08/15/19 1239 84/01/21 0036  NA 131* 135  K 3.3* 3.2*  CL 93* 99  CO2 23 25  GLUCOSE 150* 99  BUN 12 10  CREATININE 0.79 0.69  CALCIUM 8.7* 8.5*   GFR Estimated Creatinine Clearance: 44.6 mL/min (by C-G formula based on SCr of 0.69 mg/dL). Liver Function Tests: Recent Labs  Lab 08/15/19 1239  AST 26  ALT 14  ALKPHOS 74  BILITOT 0.5  PROT 6.9  ALBUMIN 2.9*   Recent Labs  Lab 08/15/19 1239  LIPASE 47   No results for input(s): AMMONIA in  the last 168 hours. Coagulation profile Recent Labs  Lab 08/15/19 1239  INR 1.0    CBC: Recent Labs  Lab 08/15/19 1239 84/01/21 0036  WBC 6.5 6.4  NEUTROABS 3.3  --   HGB 12.1 11.2*  HCT 35.7* 33.3*  MCV 89.5 90.5  PLT 418* 368   Cardiac Enzymes: No results for input(s): CKTOTAL, CKMB, CKMBINDEX, TROPONINI in the last 168 hours. BNP: Invalid input(s): POCBNP CBG: No results for input(s): GLUCAP in the last 168 hours. D-Dimer No results for input(s): DDIMER in the last 72 hours. Hgb A1c No results for input(s): HGBA1C in the last 72 hours. Lipid Profile No results for input(s): CHOL, HDL, LDLCALC, TRIG, CHOLHDL, LDLDIRECT in the last 72 hours. Thyroid function studies No results for input(s): TSH, T4TOTAL, T3FREE, THYROIDAB in the last 72 hours.  Invalid input(s): FREET3 Anemia work up No results for input(s): VITAMINB12, FOLATE, FERRITIN, TIBC, IRON, RETICCTPCT in the last 72 hours. Microbiology Recent Results (from the past 240 hour(s))  SARS Coronavirus 2 by RT PCR (hospital order, performed in [facility] hospital lab) Nasopharyngeal Nasopharyngeal Swab     Status: None   Collection Time: 08/15/19  3:46 PM   Specimen: Nasopharyngeal Swab  Result Value Ref Range Status   SARS Coronavirus 2 NEGATIVE NEGATIVE Final    Comment: (  NOTE) SARS-CoV-2 target nucleic acids are NOT DETECTED.  The SARS-CoV-2 RNA is generally detectable in upper and lower respiratory specimens during the acute phase of infection. The lowest concentration of SARS-CoV-2 viral copies this assay can detect is 250 copies / mL. A negative result does not preclude SARS-CoV-2 infection and should not be used as the sole basis for treatment or other patient management decisions.  A negative result may occur with improper specimen collection / handling, submission of specimen other than nasopharyngeal swab, presence of viral mutation(s) within the areas targeted by this assay, and inadequate number  of viral copies (<250 copies / mL). A negative result must be combined with clinical observations, patient history, and epidemiological information.  Fact Sheet for Patients:   StrictlyIdeas.no  Fact Sheet for Healthcare Providers: BankingDealers.co.za  This test is not yet approved or  cleared by the [place] FDA and has been authorized for detection and/or diagnosis of SARS-CoV-2 by FDA under an Emergency Use Authorization (EUA).  This EUA will remain in effect (meaning this test can be used) for the duration of the COVID-19 declaration under Section 564(b)(1) of the Act, 21 U.S.C. section 360bbb-3(b)(1), unless the authorization is terminated or revoked sooner.  Performed at [facility], [address] [place], [place] [address]       Studies:  CT Abdomen Pelvis W Contrast  Result Date: 08/15/2019 CLINICAL DATA:  Abdominal pain. 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 CONTRAST:  155mL OMNIPAQUE IOHEXOL 300 MG/ML  SOLN COMPARISON:  CT 05/18/2019 FINDINGS: Lower chest: Visualized lung bases are clear. Heart size within normal limits. Coronary artery calcification. Hepatobiliary: Interval placement of biliary stent within the common bile duct. Intrahepatic biliary dilatation with postprocedural pneumobilia. No intrahepatic lesion. Layering stones within the gallbladder lumen. Small amount of air is also noted within the gallbladder. Pancreas: Marked pancreatic ductal dilatation. Ill-defined low-density mass at the region of the pancreatic head and ampulla measuring approximately 3.0 x 2.0 cm (series 2, image 34) compatible with known biopsy-proven ampullary carcinoma. No  peripancreatic inflammatory changes or fluid collection. Spleen: Normal in size without focal abnormality. Adrenals/Urinary Tract: Unremarkable adrenal glands. Kidneys enhance symmetrically without focal lesion, stone, or hydronephrosis. Ureters are nondilated. Urinary bladder appears unremarkable. Stomach/Bowel: Stomach is massively distended and fluid-filled. The duodenal bulb and descending duodenum are distended to the level of the ampulla. Suspected invasion and obstruction of the duodenum at the level of the ampulla. The duodenum distal to this level is collapsed. There is a small amount of fluid within the more distal small bowel. Mild volume of stool within the colon. No pericolonic inflammatory changes. A normal appendix is visualized. Vascular/Lymphatic: Scattered aortoiliac atherosclerotic calcifications without aneurysm. No abdominopelvic lymphadenopathy. Reproductive: Uterus and bilateral adnexa are unremarkable. Other: No ascites. No abdominopelvic fluid collection. No pneumoperitoneum. Musculoskeletal: No new or acute osseous findings. IMPRESSION: 1. Ill-defined low-density mass at the region of the pancreatic head and ampulla measuring up to 3.0 cm compatible with known biopsy-proven ampullary carcinoma. Findings suggest invasion/obstruction of the duodenal lumen at this level. The stomach and proximal duodenum are markedly dilated upstream from the level of the ampullary tumor. Findings compatible with mechanical outlet obstruction. 2. Interval placement of biliary stent within the common bile duct. Intrahepatic biliary dilatation with postprocedural pneumobilia. 3. Cholelithiasis. 4. Aortic  atherosclerosis. (ICD10-I70.0). These results were called by telephone at the time of interpretation on 08/15/2019 at 2:52 pm to provider [facility] , who verbally acknowledged these results. Electronically Signed   By: Davina Poke D.O.   On: 08/15/2019 14:53   DG Abd Portable 1V  Result Date:  08/15/2019 CLINICAL DATA:  NG tube placement EXAM: PORTABLE ABDOMEN - 1 VIEW COMPARISON:  CT abdomen pelvis 08/15/2019 FINDINGS: Interval placement of a transesophageal tube which terminates in left mid abdomen possibly within the distended gastric body with the side port distal to the GE junction. Additional telemetry leads overlie the chest. Right upper quadrant biliary stent noted as well. Atelectatic changes in the otherwise clear lung bases. Cardiomediastinal contours are unremarkable. No evidence of subdiaphragmatic free air. No suspicious calcifications the imaged portions of the abdomen. Excreted contrast media in the renal collecting system. Degenerative changes throughout the imaged spine. No worrisome osseous lesions. IMPRESSION: Interval placement of a transesophageal tube which terminates in left mid abdomen possibly within the distended gastric body with the side port distal to the GE junction. Electronically Signed   By: Lovena Le M.D.   On: 08/15/2019 16:22    Assessment: 86 y.o. female   1.  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  Ampullary carcinoma, status post palliative radiation, on supportive care now 3.  Dementia 4.  Hypertension and type 2 diabetes  Plan:  -I have reviewed her CT scan findings, which showed significant gastric outlet obstruction from the ampullary tumor.  No evidence of metastatic cancer on the scan. -Due to her advanced age and medical comorbidities, especially dementia, she is not a candidate for Whipple surgery or chemotherapy, she has had radiation to the primary tumor, no other cancer treatment options available, and the goal of her oncology care is palliative -Again due to her advanced age and dementia, I do not recommend bypass surgery -I discussed the option of duodenal stent placement if feasible, or G-J tube placement for ventilation/symptom management, along with J tube for nutrition if possible. -I have called  [facility] and they will see her today -continue supportive care with IVF, pain and other symptom management now  -will like transition her to hospice care upon discharge  -I will f/u tomorrow.    Truitt Merle, MD 08/16/2019  10:57 AM

## 2019-08-16 NOTE — Progress Notes (Signed)
PROGRESS NOTE    Debra [name]  XLK:[id number] DOB: [birth year] DOA: 08/15/2019 PCP: Asencion Noble, MD   Chief Complaint  Patient presents with  . Abdominal Pain    Brief Narrative:  Debra [name] is Debra [name] 86 y.o. female with medical history significant for Ampullary carinoma and s/p CBD stent in 05/2019 s/p radiation, hypertension, type 2 diabetes, TIA and hyperlipidemia who presents with concerns of nausea and vomiting.  Patient has been noting some sharp umbilical pain for the past several days and then today had an episode of vomiting.  She decided to present to [place] ED since her oncologist has told her in the past to be evaluated if any vomiting occurs.  Otherwise, she denies any fever or diarrhea.  Patient has history of ampullary carcinoma with CBD stent and just finished radiation in June. CT abdomen pelvis in the ED was significant for finding  suggestive of invasive/obstruction of the duodenum from the ampulla carcinoma.  Unfortunately, per oncology documentation patient not [name] candidate for surgery or chemotherapy due to advanced age and multiple comorbidities including dementia. Dr.feng is her primary oncologist. Currently she has Debra [name] home palliative care nurse and family is actively trying to select one HCPOA since patient has dementia.  Labs otherwise showed no leukocytosis with mild thrombocytosis of 418.  Lactic acid of 4.9 down to 3.9 with 500cc NS fluids. Sodium of 131, glucose of 150, K of 3.3, normal creatinine 0.79, calcium 8.7, albumin 2.9.  NG tube was inserted and patient was requested to be transferred to [facility] from [place] ED.  Assessment & Plan:   Principal Problem:   SBO (small bowel obstruction) (HCC) Active Problems:   HLD (hyperlipidemia)   HTN (hypertension)   DM (diabetes mellitus), type 2 (HCC)   Pain in the abdomen   High serum lactate   Thrombocytosis (HCC)  Mechanical duodenal obstruction secondary to  known ampulla carcinoma CT with low density mass at the region of pancreatic head and ampulla with findings suggestive of invasion/obstruction of the duodenal lumen.  Findings compatible with mechanical outlet obstruction. NG tube in place.  Will keep n.p.o. PRN antiemetic.  PRN pain control  Consult oncology in the morning - discussed with Dr. Burr Medico, she'll come to discuss options with family.  Further decisions pending that discussion.   Hypokalemia: place in maintenance fluids, follow mag  Elevated lactate resolved  Mild hyponatremia Due to vomiting and decreased p.o. intake Improved  Mild thrombocytosis Improved  HTN Stable. Resume antihypertensives when able to tolerate p.o.  Type 2 diabetes Controlled. Monitor without sliding scale while NPO  DVT prophylaxis:lovenox Code Status: full  Family Communication: Debra [name], daughter at bedside Disposition:   Status is: Inpatient  Remains inpatient appropriate because:Inpatient level of care appropriate due to severity of illness   Dispo: The patient is from: Home              Anticipated d/c is to: pending              Anticipated d/c date is: > 3 days              Patient currently is not medically stable to d/c.   Consultants:   oncology  Procedures:  NG placement  Antimicrobials:  Anti-infectives (From admission, onward)   None     Subjective: NG tube irritating [name]&Ox1  Objective: Vitals:   08/15/19 1941 08/15/19 2037 08/16/19 0202 08/16/19 0533  BP: 113/66 (!) 137/98 (!) 128/62  120/79  Pulse: 99 (!) 106 91 103  Resp: (!) 25 19 17 17   Temp: 98.2 F (36.8 C) (!) 97.5 F (36.4 C) (!) 97.4 F (36.3 C) 98.3 F (36.8 C)  TempSrc: Oral Oral Oral Oral  SpO2: 100% (!) 82% 100% 100%  Weight:      Height:        Intake/Output Summary (Last 24 hours) at 08/16/2019 0856 Last data filed at 08/16/2019 0600 Gross per 24 hour  Intake 565.21 ml  Output 1951 ml  Net -1385.79 ml   Filed Weights   08/15/19  1135  Weight: 63 kg    Examination:  General exam: Appears calm and comfortable  Respiratory system: Clear to auscultation. Respiratory effort normal. Cardiovascular system: S1 & S2 heard, RRR.  Gastrointestinal system: Abdomen is nondistended, soft and nontender. NG in place Central nervous system: Alert and oriented. No focal neurological deficits. Extremities: no LEE Skin: No rashes, lesions or ulcers Psychiatry: Judgement and insight appear normal. Mood & affect appropriate.     Data Reviewed: I have personally reviewed following labs and imaging studies  CBC: Recent Labs  Lab 08/15/19 1239 08/16/19 0036  WBC 6.5 6.4  NEUTROABS 3.3  --   HGB 12.1 11.2*  HCT 35.7* 33.3*  MCV 89.5 90.5  PLT 418* 211    Basic Metabolic Panel: Recent Labs  Lab 08/15/19 1239 08/16/19 0036  NA 131* 135  K 3.3* 3.2*  CL 93* 99  CO2 23 25  GLUCOSE 150* 99  BUN 12 10  CREATININE 0.79 0.69  CALCIUM 8.7* 8.5*    GFR: Estimated Creatinine Clearance: 44.6 mL/min (by C-G formula based on SCr of 0.69 mg/dL).  Liver Function Tests: Recent Labs  Lab 08/15/19 1239  AST 26  ALT 14  ALKPHOS 74  BILITOT 0.5  PROT 6.9  ALBUMIN 2.9*    CBG: No results for input(s): GLUCAP in the last 168 hours.   Recent Results (from the past 240 hour(s))  SARS Coronavirus 2 by RT PCR (hospital order, performed in [facility] hospital lab) Nasopharyngeal Nasopharyngeal Swab     Status: None   Collection Time: 08/15/19  3:46 PM   Specimen: Nasopharyngeal Swab  Result Value Ref Range Status   SARS Coronavirus 2 NEGATIVE NEGATIVE Final    Comment: (NOTE) SARS-CoV-2 target nucleic acids are NOT DETECTED.  The SARS-CoV-2 RNA is generally detectable in upper and lower respiratory specimens during the acute phase of infection. The lowest concentration of SARS-CoV-2 viral copies this assay can detect is 250 copies / mL. [name] negative result does not preclude SARS-CoV-2 infection and should not be used  as the sole basis for treatment or other patient management decisions.  [name] negative result may occur with improper specimen collection / handling, submission of specimen other than nasopharyngeal swab, presence of viral mutation(s) within the areas targeted by this assay, and inadequate number of viral copies (<250 copies / mL). Glessie Eustice negative result must be combined with clinical observations, patient history, and epidemiological information.  Fact Sheet for Patients:   StrictlyIdeas.no  Fact Sheet for Healthcare Providers: BankingDealers.co.za  This test is not yet approved or  cleared by the [place] FDA and has been authorized for detection and/or diagnosis of SARS-CoV-2 by FDA under an Emergency Use Authorization (EUA).  This EUA will remain in effect (meaning this test can be used) for the duration of the COVID-19 declaration under Section 564(b)(1) of the Act, 21 U.S.C. section 360bbb-3(b)(1), unless the authorization is terminated  or revoked sooner.  Performed at [facility], [address], [place], [place] [address]          Radiology Studies: CT Abdomen Pelvis W Contrast  Result Date: 08/15/2019 CLINICAL DATA:  Abdominal pain. 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 CONTRAST:  111mL OMNIPAQUE IOHEXOL 300 MG/ML  SOLN COMPARISON:  CT 05/18/2019 FINDINGS: Lower chest: Visualized lung bases are clear. Heart size within normal limits. Coronary artery calcification. Hepatobiliary: Interval placement of biliary stent within the common bile duct. Intrahepatic biliary dilatation with postprocedural pneumobilia. No intrahepatic lesion. Layering stones within the gallbladder lumen.  Small amount of air is also noted within the gallbladder. Pancreas: Marked pancreatic ductal dilatation. Ill-defined low-density mass at the region of the pancreatic head and ampulla measuring approximately 3.0 x 2.0 cm (series 2, image 34) compatible with known biopsy-proven ampullary carcinoma. No peripancreatic inflammatory changes or fluid collection. Spleen: Normal in size without focal abnormality. Adrenals/Urinary Tract: Unremarkable adrenal glands. Kidneys enhance symmetrically without focal lesion, stone, or hydronephrosis. Ureters are nondilated. Urinary bladder appears unremarkable. Stomach/Bowel: Stomach is massively distended and fluid-filled. The duodenal bulb and descending duodenum are distended to the level of the ampulla. Suspected invasion and obstruction of the duodenum at the level of the ampulla. The duodenum distal to this level is collapsed. There is Debora Stockdale small amount of fluid within the more distal small bowel. Mild volume of stool within the colon. No pericolonic inflammatory changes. Karlita Lichtman normal appendix is visualized. Vascular/Lymphatic: Scattered aortoiliac atherosclerotic calcifications without aneurysm. No abdominopelvic lymphadenopathy. Reproductive: Uterus and bilateral adnexa are unremarkable. Other: No ascites. No abdominopelvic fluid collection. No pneumoperitoneum. Musculoskeletal: No new or acute osseous findings. IMPRESSION: 1. Ill-defined low-density mass at the region of the pancreatic head and ampulla measuring up to 3.0 cm compatible with known biopsy-proven ampullary carcinoma. Findings suggest invasion/obstruction of the duodenal lumen at this level. The stomach and proximal duodenum are markedly dilated upstream from the level of the ampullary tumor. Findings compatible with mechanical outlet obstruction. 2. Interval placement of biliary stent within the common bile duct. Intrahepatic biliary dilatation with postprocedural pneumobilia. 3. Cholelithiasis. 4. Aortic  atherosclerosis. (ICD10-I70.0). These results were called by telephone at the time of interpretation on 08/15/2019 at 2:52 pm to provider [facility] , who verbally acknowledged these results. Electronically Signed   By: Davina Poke D.O.   On: 08/15/2019 14:53   DG Abd Portable 1V  Result Date: 08/15/2019 CLINICAL DATA:  NG tube placement EXAM: PORTABLE ABDOMEN - 1 VIEW COMPARISON:  CT abdomen pelvis 08/15/2019 FINDINGS: Interval placement of Evalene Vath transesophageal tube which terminates in left mid abdomen possibly within the distended gastric body with the side port distal to the GE junction. Additional telemetry leads overlie the chest. Right upper quadrant biliary stent noted as well. Atelectatic changes in the otherwise clear lung bases. Cardiomediastinal contours are unremarkable. No evidence of subdiaphragmatic free air. No suspicious calcifications the imaged portions of the abdomen. Excreted contrast media in the renal collecting system. Degenerative changes throughout the imaged spine. No worrisome osseous lesions. IMPRESSION: Interval placement of Aleksander Edmiston transesophageal tube which terminates in left mid abdomen possibly within the distended gastric body with the side port distal to the GE junction. Electronically Signed   By: Lovena Le M.D.   On: 08/15/2019 16:22  Scheduled Meds: . enoxaparin (LOVENOX) injection  40 mg Subcutaneous Q24H  . latanoprost  1 drop Both Eyes QHS  . pantoprazole (PROTONIX) IV  40 mg Intravenous Q24H   Continuous Infusions: . sodium chloride 75 mL/hr at 08/16/19 0600     LOS: 1 day    Time spent: over 30 min    Fayrene Helper, MD [facility]   To contact the attending provider between 7A-7P or the covering provider during after hours 7P-7A, please log into the web site [email or web address] and access using universal [place] password for that web site. If you do not have the password, please call the hospital operator.  08/16/2019, 8:56 AM

## 2019-08-16 NOTE — H&P (View-Only) (Signed)
Referring Provider: Dr. Burr Medico Primary Care Physician:  Asencion Noble, MD Primary Gastroenterologist:  Althia Forts  Reason for Consultation:  Duodenal obstruction, ? Stent placement  HPI: Debra [name] is a 86 y.o. female with medical history significant for Ampullary carinoma and s/p CBD stent in 05/2019 s/p radiation, hypertension, type 2 diabetes, TIA and hyperlipidemia who presents with complaints of nausea and vomiting.  Patient had been noting some abdominal pain for the past several days and then began vomiting on the day of presentation.    CT scan of the abdomen and pelvis with contrast showed the following:  IMPRESSION: 1. Ill-defined low-density mass at the region of the pancreatic head and ampulla measuring up to 3.0 cm compatible with known biopsy-proven ampullary carcinoma. Findings suggest invasion/obstruction of the duodenal lumen at this level. The stomach and proximal duodenum are markedly dilated upstream from the level of the ampullary tumor. Findings compatible with mechanical outlet obstruction. 2. Interval placement of biliary stent within the common bile duct. Intrahepatic biliary dilatation with postprocedural pneumobilia. 3. Cholelithiasis. 4. Aortic atherosclerosis. (ICD10-I70.0).  NGT is in place and she says that she feels much better.  2 daughters and son are at bedside.  GI was consulted by Dr. Burr Medico of oncology to consider duodenal stent placement as palliative measure.  She is not a candidate for surgery or chemo and already had radiation to the primary tumor.  Had ERCP by Dr. Ardis Hughs in 05/2019 for biliary obstruction and he placed a 4cm long 55m diameter uncovered metal mesh stent at that time.  LFTs are normal.   Past Medical History:  Diagnosis Date  . CKD (chronic kidney disease)   . [place] complication [place]   . Gout   . Hypercholesteremia   . Hypertension   . 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 BILIARY STENT PLACEMENT  05/20/2019   Procedure: BILIARY STENT PLACEMENT;  Surgeon: JMilus Banister MD;  [facility];  Service: Endoscopy;;  . BIOPSY  05/20/2019   Procedure: BIOPSY;  Surgeon: JMilus Banister MD;  [facility];  Service: Endoscopy;;  . ERCP N/A 05/20/2019   Procedure: ENDOSCOPIC RETROGRADE CHOLANGIOPANCREATOGRAPHY (ERCP);  Surgeon: JMilus Banister MD;  [facility];  Service: Endoscopy;  Laterality: N/A;  . TOTAL KNEE ARTHROPLASTY Left     Prior to Admission medications   Medication Sig Start Date End Date Taking? Authorizing Provider  acetaminophen (TYLENOL) 325 MG tablet Take 325 mg by mouth every 6 (six) hours as needed for mild pain, moderate pain, fever or headache.   Yes [provider]  latanoprost (XALATAN) 0.005 % ophthalmic solution Place 1 drop into both eyes at bedtime.    Yes [provider]  megestrol (MEGACE ES) 625 MG/5ML suspension Take 5 mLs (625 mg total) by mouth daily. 06/30/19  Yes FTruitt Merle MD  metFORMIN (GLUCOPHAGE-XR) 500 MG 24 hr tablet Take 500 mg by mouth daily with breakfast.    Yes [provider]  omeprazole (PRILOSEC) 20 MG capsule Take 1 capsule (20 mg total) by mouth daily. 07/29/19  Yes FTruitt Merle MD  potassium chloride (KLOR-CON) 10 MEQ tablet Take 1 tablet (10 mEq total) by mouth daily. 07/29/19  Yes FTruitt Merle MD  senna (SENOKOT) 8.6 MG TABS tablet Take 1 tablet (8.6 mg total) by mouth at bedtime as needed for mild constipation. 05/23/19  Yes Regalado, Belkys A, MD  triamcinolone (KENALOG)  0.025 % cream Apply 1 application topically 2 (two) times daily as needed (Rash).  03/23/19  Yes [provider]  ACCU-CHEK GUIDE test strip 2 (two) times daily. use for testing 05/23/19   [provider]  Accu-Chek Softclix Lancets lancets USE TWICE DAILY with strips 05/23/19   [provider]  Blood Glucose Monitoring Suppl  (ACCU-CHEK GUIDE) w/Device KIT USE AS DIRECTED TO test TWICE DAILY 05/23/19   [provider]  Insulin Infusion Pump (ACCU-CHEK COMBO) KIT 1 application by Does not apply route 2 (two) times daily. Patient not taking: Reported on 08/15/2019 05/23/19   Regalado, Jerald Kief A, MD  mirtazapine (REMERON) 7.5 MG tablet Take 1 tablet (7.5 mg total) by mouth at bedtime. Patient not taking: Reported on 08/15/2019 05/29/19   Heilingoetter, Cassandra L, PA-C    Current Facility-Administered Medications  Medication Dose Route Frequency Provider Last Rate Last Admin  . enoxaparin (LOVENOX) injection 40 mg  40 mg Subcutaneous Q24H Tu, Ching T, DO   40 mg at 08/16/19 1011  . lactated ringers 1,000 mL with potassium chloride 40 mEq infusion   Intravenous Continuous Elodia Florence., MD 75 mL/hr at 08/16/19 1000 New Bag at 08/16/19 1000  . latanoprost (XALATAN) 0.005 % ophthalmic solution 1 drop  1 drop Both Eyes QHS Sharion Settler, NP   1 drop at 08/15/19 2330  . morphine 2 MG/ML injection 0.5 mg  0.5 mg Intravenous Q3H PRN Tu, Ching T, DO      . ondansetron (ZOFRAN) injection 4 mg  4 mg Intravenous Q6H PRN Tu, Ching T, DO      . pantoprazole (PROTONIX) injection 40 mg  40 mg Intravenous Q24H Tu, Ching T, DO   40 mg at 08/16/19 1011    Allergies as of 08/15/2019 - Review Complete 08/15/2019  Allergen Reaction Noted  . Other Nausea And Vomiting 04/27/2015    Family History  Problem Relation Age of Onset  . Diabetes Mother   . Hypertension Father   . Breast cancer Niece   . Colon cancer Neg Hx     Social History   Socioeconomic History  . Marital status: Single    Spouse name: Not on file  . Number of children: Not on file  . Years of education: Not on file  . Highest education level: Not on file  Occupational History  . Not on file  Tobacco Use  . Smoking status: Never Smoker  . Smokeless tobacco: Never Used  Substance and Sexual Activity  . Alcohol use: No  . Drug use: No  .  Sexual activity: Not on file  Other Topics Concern  . Not on file  Social History Narrative  . Not on file   Social Determinants of Health   Financial Resource Strain:   . Difficulty of Paying Living Expenses:   Food Insecurity:   . Worried About Charity fundraiser in the Last Year:   . Arboriculturist in the Last Year:   Transportation Needs:   . Film/video editor (Medical):   Marland Kitchen Lack of Transportation (Non-Medical):   Physical Activity:   . Days of Exercise per Week:   . Minutes of Exercise per Session:   Stress:   . Feeling of Stress :   Social Connections:   . Frequency of Communication with Friends and Family:   . Frequency of Social Gatherings with Friends and Family:   . Attends Religious Services:   . Active Member of Clubs or  Organizations:   . Attends Archivist Meetings:   Marland Kitchen Marital Status:   Intimate Partner Violence:   . Fear of Current or Ex-Partner:   . Emotionally Abused:   Marland Kitchen Physically Abused:   . Sexually Abused:     Review of Systems: ROS is O/W negative except as mentioned in HPI.  Physical Exam: Vital signs in last 24 hours: Temp:  [97.4 F (36.3 C)-99.2 F (37.3 C)] 99.2 F (37.3 C) (08/01 1307) Pulse Rate:  [86-106] 86 (08/01 1307) Resp:  [14-25] 14 (08/01 1307) BP: (113-138)/(50-98) 124/70 (08/01 1307) SpO2:  [82 %-100 %] 100 % (08/01 1307) Last BM Date: 08/13/19 General:  Alert, thin AA female, pleasant and cooperative in NAD Head:  Normocephalic and atraumatic. Eyes:  Sclera clear, no icterus.  Conjunctiva pink. Ears:  Normal auditory acuity. Mouth:  No deformity or lesions.   Lungs:  Clear throughout to auscultation.  No wheezes, crackles, or rhonchi.  Heart:  Regular rate and rhythm Abdomen:  Soft, non-distended.  BS present.  Non-tender.   Msk:  Symmetrical without gross deformities. Pulses:  Normal pulses noted. Extremities:  Without clubbing or edema. Neurologic:  Alert and oriented x 4;  grossly normal  neurologically. Skin:  Intact without significant lesions or rashes. Psych:  Alert and cooperative. Normal mood and affect.  Intake/Output from previous day: 07/31 0701 - 08/01 0700 In: 565.2 [I.V.:565.2] Out: 1951 [Urine:1; Emesis/NG output:1250] Intake/Output this shift: Total I/O In: 299.9 [I.V.:299.9] Out: 600 [Urine:200; Emesis/NG output:400]  Lab Results: Recent Labs    08/15/19 1239 08/16/19 0036  WBC 6.5 6.4  HGB 12.1 11.2*  HCT 35.7* 33.3*  PLT 418* 368   BMET Recent Labs    08/15/19 1239 08/16/19 0036  NA 131* 135  K 3.3* 3.2*  CL 93* 99  CO2 23 25  GLUCOSE 150* 99  BUN 12 10  CREATININE 0.79 0.69  CALCIUM 8.7* 8.5*   LFT Recent Labs    08/15/19 1239  PROT 6.9  ALBUMIN 2.9*  AST 26  ALT 14  ALKPHOS 74  BILITOT 0.5   PT/INR Recent Labs    08/15/19 1239  LABPROT 13.1  INR 1.0   Studies/Results: CT Abdomen Pelvis W Contrast  Result Date: 08/15/2019 CLINICAL DATA:  Abdominal pain. 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 CONTRAST:  177m OMNIPAQUE IOHEXOL 300 MG/ML  SOLN COMPARISON:  CT 05/18/2019 FINDINGS: Lower chest: Visualized lung bases are clear. Heart size within normal limits. Coronary artery calcification. Hepatobiliary: Interval placement of biliary stent within the common bile duct. Intrahepatic biliary dilatation with postprocedural pneumobilia. No intrahepatic lesion. Layering stones within the gallbladder lumen. Small amount of air is also noted within the gallbladder. Pancreas: Marked pancreatic ductal dilatation. Ill-defined low-density mass at the region of the pancreatic head and ampulla measuring approximately 3.0 x 2.0 cm (series 2, image 34) compatible with known biopsy-proven ampullary  carcinoma. No peripancreatic inflammatory changes or fluid collection. Spleen: Normal in size without focal abnormality. Adrenals/Urinary Tract: Unremarkable adrenal glands. Kidneys enhance symmetrically without focal lesion, stone, or hydronephrosis. Ureters are nondilated. Urinary bladder appears unremarkable. Stomach/Bowel: Stomach is massively distended and fluid-filled. The duodenal bulb and descending duodenum are distended to the level of the ampulla. Suspected invasion and obstruction of the duodenum at the level of the ampulla. The duodenum  distal to this level is collapsed. There is a small amount of fluid within the more distal small bowel. Mild volume of stool within the colon. No pericolonic inflammatory changes. A normal appendix is visualized. Vascular/Lymphatic: Scattered aortoiliac atherosclerotic calcifications without aneurysm. No abdominopelvic lymphadenopathy. Reproductive: Uterus and bilateral adnexa are unremarkable. Other: No ascites. No abdominopelvic fluid collection. No pneumoperitoneum. Musculoskeletal: No new or acute osseous findings. IMPRESSION: 1. Ill-defined low-density mass at the region of the pancreatic head and ampulla measuring up to 3.0 cm compatible with known biopsy-proven ampullary carcinoma. Findings suggest invasion/obstruction of the duodenal lumen at this level. The stomach and proximal duodenum are markedly dilated upstream from the level of the ampullary tumor. Findings compatible with mechanical outlet obstruction. 2. Interval placement of biliary stent within the common bile duct. Intrahepatic biliary dilatation with postprocedural pneumobilia. 3. Cholelithiasis. 4. Aortic atherosclerosis. (ICD10-I70.0). These results were called by telephone at the time of interpretation on 08/15/2019 at 2:52 pm to provider [facility] , who verbally acknowledged these results. Electronically Signed   By: Davina Poke D.O.   On: 08/15/2019 14:53   DG Abd Portable  1V  Result Date: 08/15/2019 CLINICAL DATA:  NG tube placement EXAM: PORTABLE ABDOMEN - 1 VIEW COMPARISON:  CT abdomen pelvis 08/15/2019 FINDINGS: Interval placement of a transesophageal tube which terminates in left mid abdomen possibly within the distended gastric body with the side port distal to the GE junction. Additional telemetry leads overlie the chest. Right upper quadrant biliary stent noted as well. Atelectatic changes in the otherwise clear lung bases. Cardiomediastinal contours are unremarkable. No evidence of subdiaphragmatic free air. No suspicious calcifications the imaged portions of the abdomen. Excreted contrast media in the renal collecting system. Degenerative changes throughout the imaged spine. No worrisome osseous lesions. IMPRESSION: Interval placement of a transesophageal tube which terminates in left mid abdomen possibly within the distended gastric body with the side port distal to the GE junction. Electronically Signed   By: Lovena Le M.D.   On: 08/15/2019 16:22   IMPRESSION:  1.  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  Ampullary carcinoma, status post palliative radiation, on supportive care now.  Not candidate for surgery or chemo.  Had biliary stent placed in 05/2019 for biliary obstruction. 3.  Dementia  PLAN: -Will tentatively plan for EGD with enteral stent placement on 8/2 with Dr. Ardis Hughs as long as he sees fit after reviewing the case.  Explained to the family that this is palliative to allow her to be comfortable without symptoms and to allow her to take PO nutrition.     Laban Emperor. Zunaira Lamy  08/16/2019, 1:08 PM

## 2019-08-16 NOTE — Consult Note (Signed)
Referring Provider: Dr. Burr Medico Primary Care Physician:  Asencion Noble, MD Primary Gastroenterologist:  Althia Forts  Reason for Consultation:  Duodenal obstruction, ? Stent placement  HPI: Debra [name] is a 86 y.o. female with medical history significant for Ampullary carinoma and s/p CBD stent in 05/2019 s/p radiation, hypertension, type 2 diabetes, TIA and hyperlipidemia who presents with complaints of nausea and vomiting.  Patient had been noting some abdominal pain for the past several days and then began vomiting on the day of presentation.    CT scan of the abdomen and pelvis with contrast showed the following:  IMPRESSION: 1. Ill-defined low-density mass at the region of the pancreatic head and ampulla measuring up to 3.0 cm compatible with known biopsy-proven ampullary carcinoma. Findings suggest invasion/obstruction of the duodenal lumen at this level. The stomach and proximal duodenum are markedly dilated upstream from the level of the ampullary tumor. Findings compatible with mechanical outlet obstruction. 2. Interval placement of biliary stent within the common bile duct. Intrahepatic biliary dilatation with postprocedural pneumobilia. 3. Cholelithiasis. 4. Aortic atherosclerosis. (ICD10-I70.0).  NGT is in place and she says that she feels much better.  2 daughters and son are at bedside.  GI was consulted by Dr. Burr Medico of oncology to consider duodenal stent placement as palliative measure.  She is not a candidate for surgery or chemo and already had radiation to the primary tumor.  Had ERCP by Dr. Ardis Hughs in 05/2019 for biliary obstruction and he placed a 4cm long 55m diameter uncovered metal mesh stent at that time.  LFTs are normal.   Past Medical History:  Diagnosis Date  . CKD (chronic kidney disease)   . [place] complication [place]   . Gout   . Hypercholesteremia   . Hypertension   . 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 BILIARY STENT PLACEMENT  05/20/2019   Procedure: BILIARY STENT PLACEMENT;  Surgeon: JMilus Banister MD;  [facility];  Service: Endoscopy;;  . BIOPSY  05/20/2019   Procedure: BIOPSY;  Surgeon: JMilus Banister MD;  [facility];  Service: Endoscopy;;  . ERCP N/A 05/20/2019   Procedure: ENDOSCOPIC RETROGRADE CHOLANGIOPANCREATOGRAPHY (ERCP);  Surgeon: JMilus Banister MD;  [facility];  Service: Endoscopy;  Laterality: N/A;  . TOTAL KNEE ARTHROPLASTY Left     Prior to Admission medications   Medication Sig Start Date End Date Taking? Authorizing Provider  acetaminophen (TYLENOL) 325 MG tablet Take 325 mg by mouth every 6 (six) hours as needed for mild pain, moderate pain, fever or headache.   Yes [provider]  latanoprost (XALATAN) 0.005 % ophthalmic solution Place 1 drop into both eyes at bedtime.    Yes [provider]  megestrol (MEGACE ES) 625 MG/5ML suspension Take 5 mLs (625 mg total) by mouth daily. 06/30/19  Yes FTruitt Merle MD  metFORMIN (GLUCOPHAGE-XR) 500 MG 24 hr tablet Take 500 mg by mouth daily with breakfast.    Yes [provider]  omeprazole (PRILOSEC) 20 MG capsule Take 1 capsule (20 mg total) by mouth daily. 07/29/19  Yes FTruitt Merle MD  potassium chloride (KLOR-CON) 10 MEQ tablet Take 1 tablet (10 mEq total) by mouth daily. 07/29/19  Yes FTruitt Merle MD  senna (SENOKOT) 8.6 MG TABS tablet Take 1 tablet (8.6 mg total) by mouth at bedtime as needed for mild constipation. 05/23/19  Yes Regalado, Belkys A, MD  triamcinolone (KENALOG)  0.025 % cream Apply 1 application topically 2 (two) times daily as needed (Rash).  03/23/19  Yes [provider]  ACCU-CHEK GUIDE test strip 2 (two) times daily. use for testing 05/23/19   [provider]  Accu-Chek Softclix Lancets lancets USE TWICE DAILY with strips 05/23/19   [provider]  Blood Glucose Monitoring Suppl  (ACCU-CHEK GUIDE) w/Device KIT USE AS DIRECTED TO test TWICE DAILY 05/23/19   [provider]  Insulin Infusion Pump (ACCU-CHEK COMBO) KIT 1 application by Does not apply route 2 (two) times daily. Patient not taking: Reported on 08/15/2019 05/23/19   Regalado, Jerald Kief A, MD  mirtazapine (REMERON) 7.5 MG tablet Take 1 tablet (7.5 mg total) by mouth at bedtime. Patient not taking: Reported on 08/15/2019 05/29/19   Heilingoetter, Cassandra L, PA-C    Current Facility-Administered Medications  Medication Dose Route Frequency Provider Last Rate Last Admin  . enoxaparin (LOVENOX) injection 40 mg  40 mg Subcutaneous Q24H Tu, Ching T, DO   40 mg at 08/16/19 1011  . lactated ringers 1,000 mL with potassium chloride 40 mEq infusion   Intravenous Continuous Elodia Florence., MD 75 mL/hr at 08/16/19 1000 New Bag at 08/16/19 1000  . latanoprost (XALATAN) 0.005 % ophthalmic solution 1 drop  1 drop Both Eyes QHS Sharion Settler, NP   1 drop at 08/15/19 2330  . morphine 2 MG/ML injection 0.5 mg  0.5 mg Intravenous Q3H PRN Tu, Ching T, DO      . ondansetron (ZOFRAN) injection 4 mg  4 mg Intravenous Q6H PRN Tu, Ching T, DO      . pantoprazole (PROTONIX) injection 40 mg  40 mg Intravenous Q24H Tu, Ching T, DO   40 mg at 08/16/19 1011    Allergies as of 08/15/2019 - Review Complete 08/15/2019  Allergen Reaction Noted  . Other Nausea And Vomiting 04/27/2015    Family History  Problem Relation Age of Onset  . Diabetes Mother   . Hypertension Father   . Breast cancer Niece   . Colon cancer Neg Hx     Social History   Socioeconomic History  . Marital status: Single    Spouse name: Not on file  . Number of children: Not on file  . Years of education: Not on file  . Highest education level: Not on file  Occupational History  . Not on file  Tobacco Use  . Smoking status: Never Smoker  . Smokeless tobacco: Never Used  Substance and Sexual Activity  . Alcohol use: No  . Drug use: No  .  Sexual activity: Not on file  Other Topics Concern  . Not on file  Social History Narrative  . Not on file   Social Determinants of Health   Financial Resource Strain:   . Difficulty of Paying Living Expenses:   Food Insecurity:   . Worried About Charity fundraiser in the Last Year:   . Arboriculturist in the Last Year:   Transportation Needs:   . Film/video editor (Medical):   Marland Kitchen Lack of Transportation (Non-Medical):   Physical Activity:   . Days of Exercise per Week:   . Minutes of Exercise per Session:   Stress:   . Feeling of Stress :   Social Connections:   . Frequency of Communication with Friends and Family:   . Frequency of Social Gatherings with Friends and Family:   . Attends Religious Services:   . Active Member of Clubs or  Organizations:   . Attends Archivist Meetings:   Marland Kitchen Marital Status:   Intimate Partner Violence:   . Fear of Current or Ex-Partner:   . Emotionally Abused:   Marland Kitchen Physically Abused:   . Sexually Abused:     Review of Systems: ROS is O/W negative except as mentioned in HPI.  Physical Exam: Vital signs in last 24 hours: Temp:  [97.4 F (36.3 C)-99.2 F (37.3 C)] 99.2 F (37.3 C) (08/01 1307) Pulse Rate:  [86-106] 86 (08/01 1307) Resp:  [14-25] 14 (08/01 1307) BP: (113-138)/(50-98) 124/70 (08/01 1307) SpO2:  [82 %-100 %] 100 % (08/01 1307) Last BM Date: 08/13/19 General:  Alert, thin AA female, pleasant and cooperative in NAD Head:  Normocephalic and atraumatic. Eyes:  Sclera clear, no icterus.  Conjunctiva pink. Ears:  Normal auditory acuity. Mouth:  No deformity or lesions.   Lungs:  Clear throughout to auscultation.  No wheezes, crackles, or rhonchi.  Heart:  Regular rate and rhythm Abdomen:  Soft, non-distended.  BS present.  Non-tender.   Msk:  Symmetrical without gross deformities. Pulses:  Normal pulses noted. Extremities:  Without clubbing or edema. Neurologic:  Alert and oriented x 4;  grossly normal  neurologically. Skin:  Intact without significant lesions or rashes. Psych:  Alert and cooperative. Normal mood and affect.  Intake/Output from previous day: 07/31 0701 - 08/01 0700 In: 565.2 [I.V.:565.2] Out: 1951 [Urine:1; Emesis/NG output:1250] Intake/Output this shift: Total I/O In: 299.9 [I.V.:299.9] Out: 600 [Urine:200; Emesis/NG output:400]  Lab Results: Recent Labs    08/15/19 1239 08/16/19 0036  WBC 6.5 6.4  HGB 12.1 11.2*  HCT 35.7* 33.3*  PLT 418* 368   BMET Recent Labs    08/15/19 1239 08/16/19 0036  NA 131* 135  K 3.3* 3.2*  CL 93* 99  CO2 23 25  GLUCOSE 150* 99  BUN 12 10  CREATININE 0.79 0.69  CALCIUM 8.7* 8.5*   LFT Recent Labs    08/15/19 1239  PROT 6.9  ALBUMIN 2.9*  AST 26  ALT 14  ALKPHOS 74  BILITOT 0.5   PT/INR Recent Labs    08/15/19 1239  LABPROT 13.1  INR 1.0   Studies/Results: CT Abdomen Pelvis W Contrast  Result Date: 08/15/2019 CLINICAL DATA:  Abdominal pain. 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 CONTRAST:  177m OMNIPAQUE IOHEXOL 300 MG/ML  SOLN COMPARISON:  CT 05/18/2019 FINDINGS: Lower chest: Visualized lung bases are clear. Heart size within normal limits. Coronary artery calcification. Hepatobiliary: Interval placement of biliary stent within the common bile duct. Intrahepatic biliary dilatation with postprocedural pneumobilia. No intrahepatic lesion. Layering stones within the gallbladder lumen. Small amount of air is also noted within the gallbladder. Pancreas: Marked pancreatic ductal dilatation. Ill-defined low-density mass at the region of the pancreatic head and ampulla measuring approximately 3.0 x 2.0 cm (series 2, image 34) compatible with known biopsy-proven ampullary  carcinoma. No peripancreatic inflammatory changes or fluid collection. Spleen: Normal in size without focal abnormality. Adrenals/Urinary Tract: Unremarkable adrenal glands. Kidneys enhance symmetrically without focal lesion, stone, or hydronephrosis. Ureters are nondilated. Urinary bladder appears unremarkable. Stomach/Bowel: Stomach is massively distended and fluid-filled. The duodenal bulb and descending duodenum are distended to the level of the ampulla. Suspected invasion and obstruction of the duodenum at the level of the ampulla. The duodenum  distal to this level is collapsed. There is a small amount of fluid within the more distal small bowel. Mild volume of stool within the colon. No pericolonic inflammatory changes. A normal appendix is visualized. Vascular/Lymphatic: Scattered aortoiliac atherosclerotic calcifications without aneurysm. No abdominopelvic lymphadenopathy. Reproductive: Uterus and bilateral adnexa are unremarkable. Other: No ascites. No abdominopelvic fluid collection. No pneumoperitoneum. Musculoskeletal: No new or acute osseous findings. IMPRESSION: 1. Ill-defined low-density mass at the region of the pancreatic head and ampulla measuring up to 3.0 cm compatible with known biopsy-proven ampullary carcinoma. Findings suggest invasion/obstruction of the duodenal lumen at this level. The stomach and proximal duodenum are markedly dilated upstream from the level of the ampullary tumor. Findings compatible with mechanical outlet obstruction. 2. Interval placement of biliary stent within the common bile duct. Intrahepatic biliary dilatation with postprocedural pneumobilia. 3. Cholelithiasis. 4. Aortic atherosclerosis. (ICD10-I70.0). These results were called by telephone at the time of interpretation on 08/15/2019 at 2:52 pm to provider [facility] , who verbally acknowledged these results. Electronically Signed   By: Davina Poke D.O.   On: 08/15/2019 14:53   DG Abd Portable  1V  Result Date: 08/15/2019 CLINICAL DATA:  NG tube placement EXAM: PORTABLE ABDOMEN - 1 VIEW COMPARISON:  CT abdomen pelvis 08/15/2019 FINDINGS: Interval placement of a transesophageal tube which terminates in left mid abdomen possibly within the distended gastric body with the side port distal to the GE junction. Additional telemetry leads overlie the chest. Right upper quadrant biliary stent noted as well. Atelectatic changes in the otherwise clear lung bases. Cardiomediastinal contours are unremarkable. No evidence of subdiaphragmatic free air. No suspicious calcifications the imaged portions of the abdomen. Excreted contrast media in the renal collecting system. Degenerative changes throughout the imaged spine. No worrisome osseous lesions. IMPRESSION: Interval placement of a transesophageal tube which terminates in left mid abdomen possibly within the distended gastric body with the side port distal to the GE junction. Electronically Signed   By: Lovena Le M.D.   On: 08/15/2019 16:22   IMPRESSION:  1.  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  Ampullary carcinoma, status post palliative radiation, on supportive care now.  Not candidate for surgery or chemo.  Had biliary stent placed in 05/2019 for biliary obstruction. 3.  Dementia  PLAN: -Will tentatively plan for EGD with enteral stent placement on 8/2 with Dr. Ardis Hughs as long as he sees fit after reviewing the case.  Explained to the family that this is palliative to allow her to be comfortable without symptoms and to allow her to take PO nutrition.     Laban Emperor. Chrystle Murillo  08/16/2019, 1:08 PM

## 2019-08-17 ENCOUNTER — Inpatient Hospital Stay (HOSPITAL_COMMUNITY): Payer: Medicare Other | Admitting: Certified Registered Nurse Anesthetist

## 2019-08-17 ENCOUNTER — Inpatient Hospital Stay (HOSPITAL_COMMUNITY): Payer: Medicare Other

## 2019-08-17 ENCOUNTER — Encounter (HOSPITAL_COMMUNITY)
Admission: EM | Disposition: A | Discharge status: Home / Self Care | Payer: Self-pay | Source: Home / Self Care | Attending: Family Medicine

## 2019-08-17 HISTORY — PX: DUODENAL STENT PLACEMENT: SHX5541

## 2019-08-17 HISTORY — PX: ESOPHAGOGASTRODUODENOSCOPY (EGD) WITH PROPOFOL: SHX5813

## 2019-08-17 LAB — COMPREHENSIVE METABOLIC PANEL
ALT: 13 U/L (ref 0–44)
AST: 18 U/L (ref 15–41)
Albumin: 2.6 g/dL — ABNORMAL LOW (ref 3.5–5.0)
Alkaline Phosphatase: 61 U/L (ref 38–126)
Anion gap: 10 (ref 5–15)
BUN: 9 mg/dL (ref 8–23)
CO2: 24 mmol/L (ref 22–32)
Calcium: 8.5 mg/dL — ABNORMAL LOW (ref 8.9–10.3)
Chloride: 102 mmol/L (ref 98–111)
Creatinine, Ser: 0.78 mg/dL (ref 0.44–1.00)
GFR calc Af Amer: >60 mL/min (ref >60)
GFR calc non Af Amer: >60 mL/min (ref >60)
Glucose, Bld: 69 mg/dL — ABNORMAL LOW (ref 70–99)
Potassium: 3.8 mmol/L (ref 3.5–5.1)
Sodium: 136 mmol/L (ref 135–145)
Total Bilirubin: 0.9 mg/dL (ref 0.3–1.2)
Total Protein: 5.8 g/dL — ABNORMAL LOW (ref 6.5–8.1)

## 2019-08-17 LAB — CBC WITH DIFFERENTIAL/PLATELET
Abs Immature Granulocytes: 0.01 10*3/uL (ref 0.00–0.07)
Basophils Absolute: 0.1 10*3/uL (ref 0.0–0.1)
Basophils Relative: 1 %
Eosinophils Absolute: 0.2 10*3/uL (ref 0.0–0.5)
Eosinophils Relative: 3 %
HCT: 30.1 % — ABNORMAL LOW (ref 36.0–46.0)
Hemoglobin: 10.2 g/dL — ABNORMAL LOW (ref 12.0–15.0)
Immature Granulocytes: 0 %
Lymphocytes Relative: 46 %
Lymphs Abs: 2.6 10*3/uL (ref 0.7–4.0)
MCH: 30.9 pg (ref 26.0–34.0)
MCHC: 33.9 g/dL (ref 30.0–36.0)
MCV: 91.2 fL (ref 80.0–100.0)
Monocytes Absolute: 0.6 10*3/uL (ref 0.1–1.0)
Monocytes Relative: 10 %
Neutro Abs: 2.3 10*3/uL (ref 1.7–7.7)
Neutrophils Relative %: 40 %
Platelets: 363 10*3/uL (ref 150–400)
RBC: 3.3 MIL/uL — ABNORMAL LOW (ref 3.87–5.11)
RDW: 14.5 % (ref 11.5–15.5)
WBC: 5.7 10*3/uL (ref 4.0–10.5)
nRBC: 0 % (ref 0.0–0.2)

## 2019-08-17 LAB — MAGNESIUM: Magnesium: 2.1 mg/dL (ref 1.7–2.4)

## 2019-08-17 LAB — GLUCOSE, CAPILLARY: Glucose-Capillary: 116 mg/dL — ABNORMAL HIGH (ref 70–99)

## 2019-08-17 LAB — PHOSPHORUS: Phosphorus: 3.1 mg/dL (ref 2.5–4.6)

## 2019-08-17 IMAGING — RF DG C-ARM 1-60 MIN
1 series · 15 of 24 positions shown · non-contrast
Comparison: CT abdomen and pelvis [DATE].

CLINICAL DATA: Intraoperative imaging for duodenal stent placement.

EXAM:
DG C-ARM 1-60 MIN

[Series 1: run · 15 of 29 slices shown]
[im 1/29]
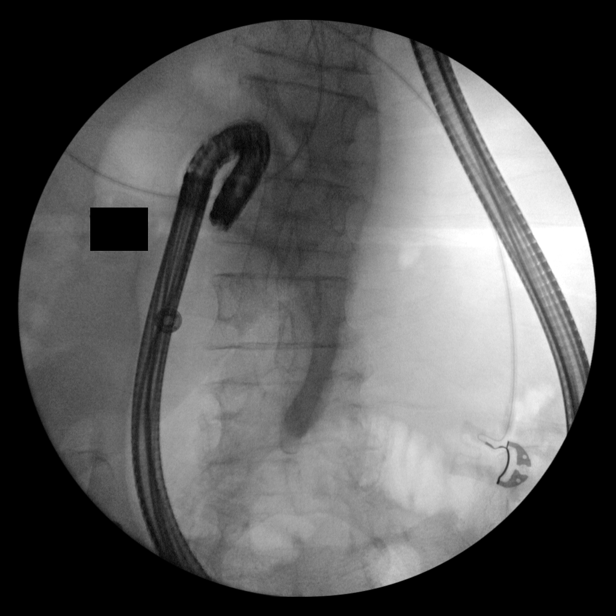
[im 3/29]
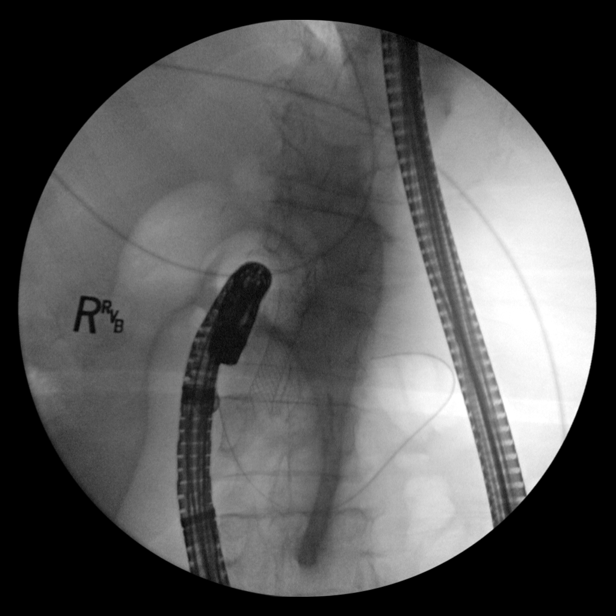
[im 5/29]
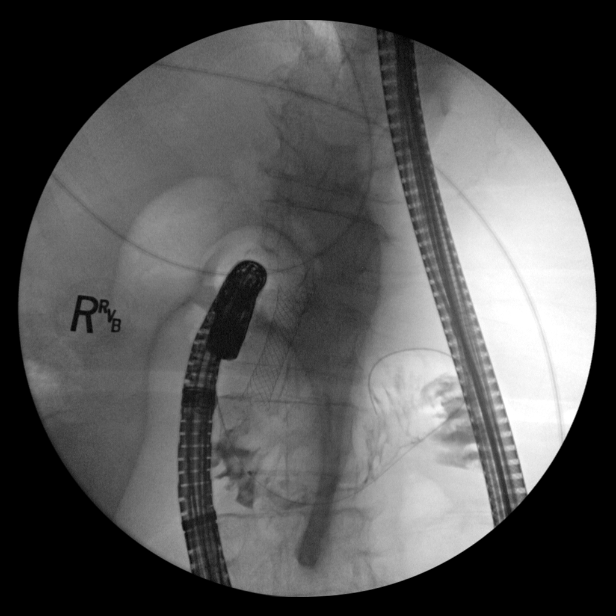
[im 7/29]
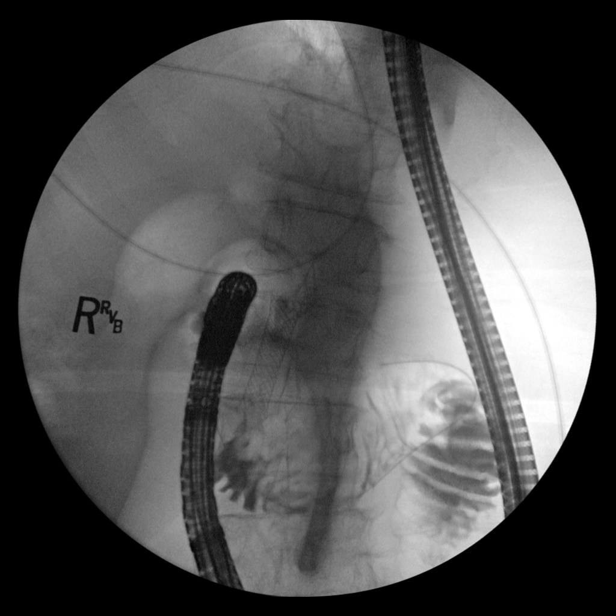
[im 9/29]
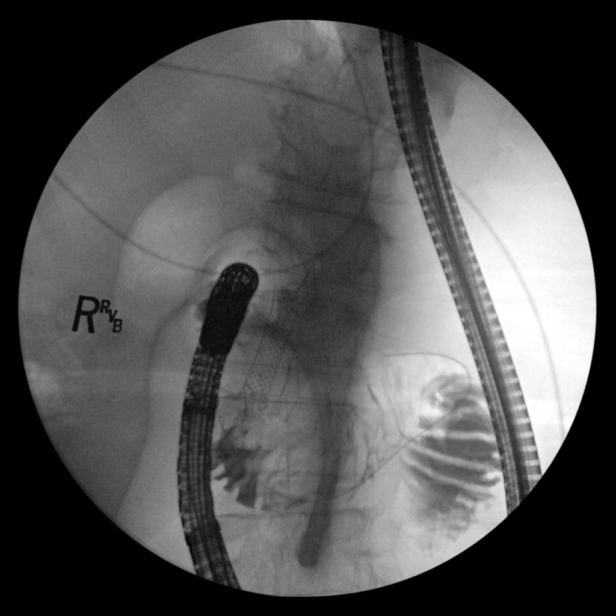
[im 10/29]
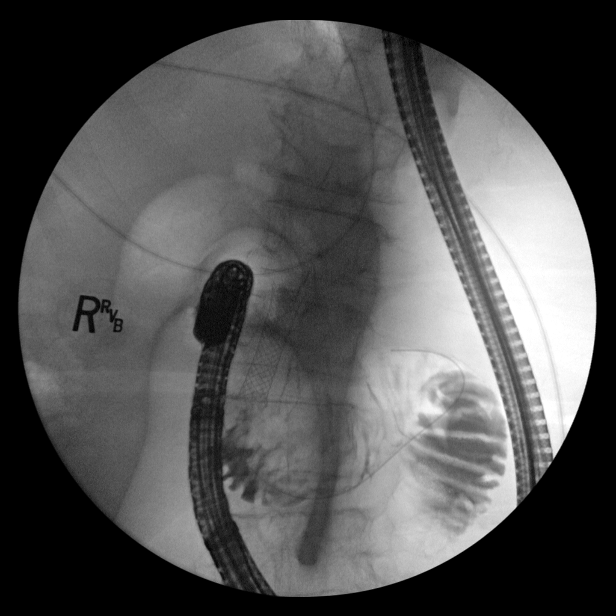
[im 13/29]
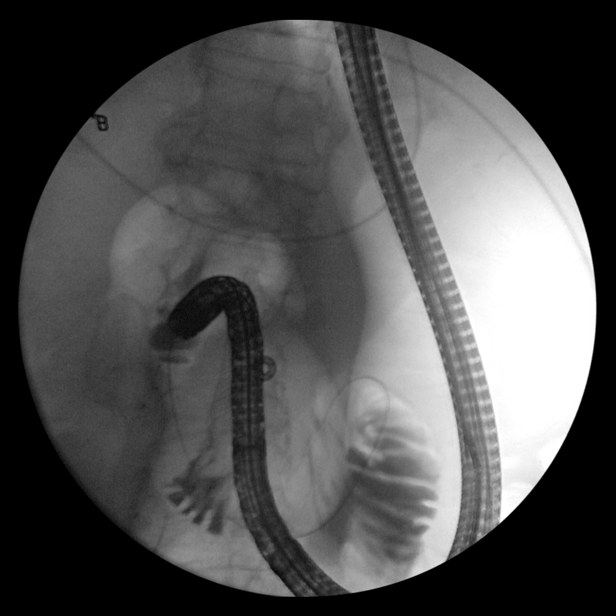
[im 15/29]
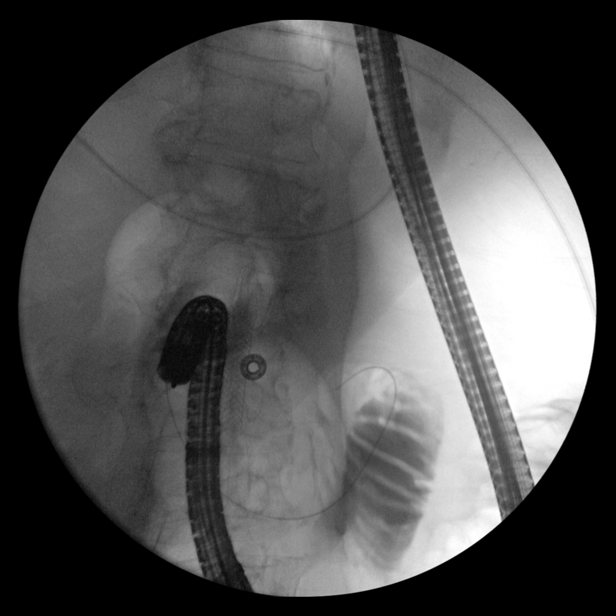
[im 16/29]
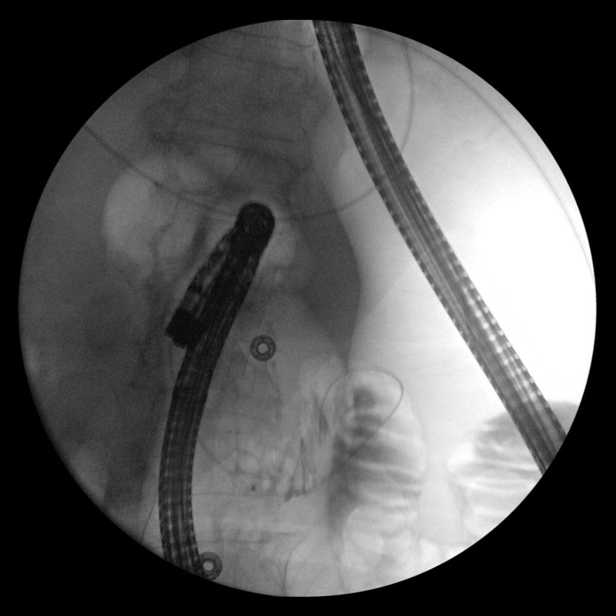
[im 19/29]
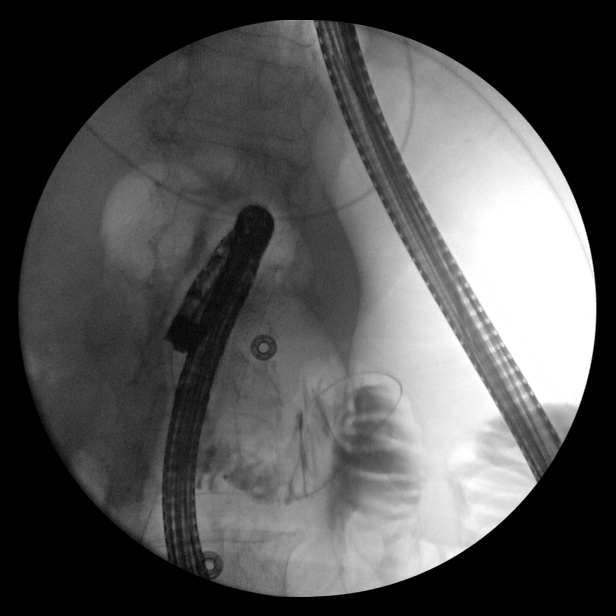
[im 20/29]
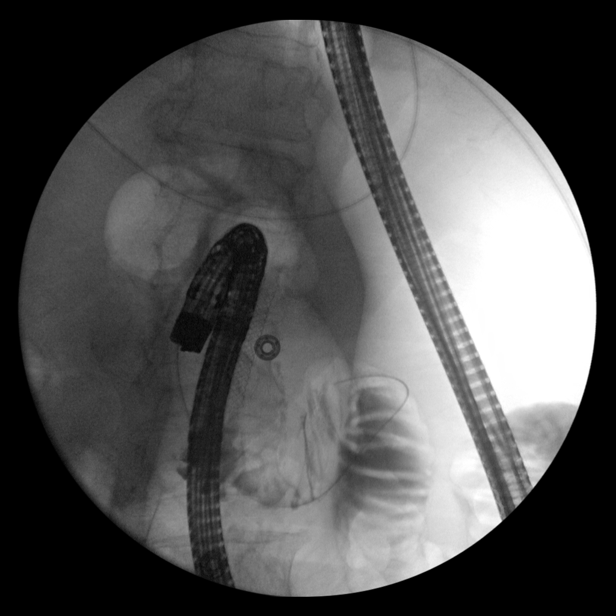
[im 22/29]
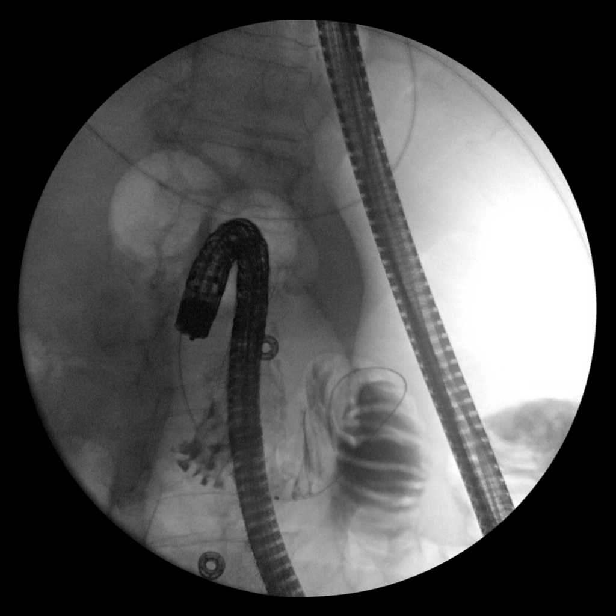
[im 25/29]
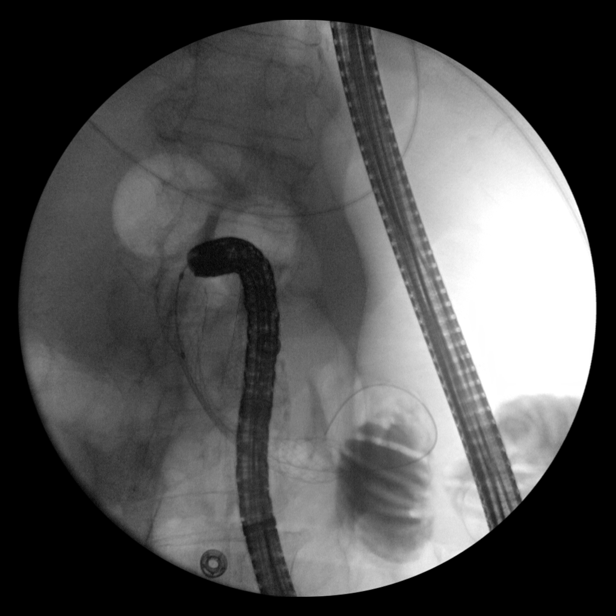
[im 26/29]
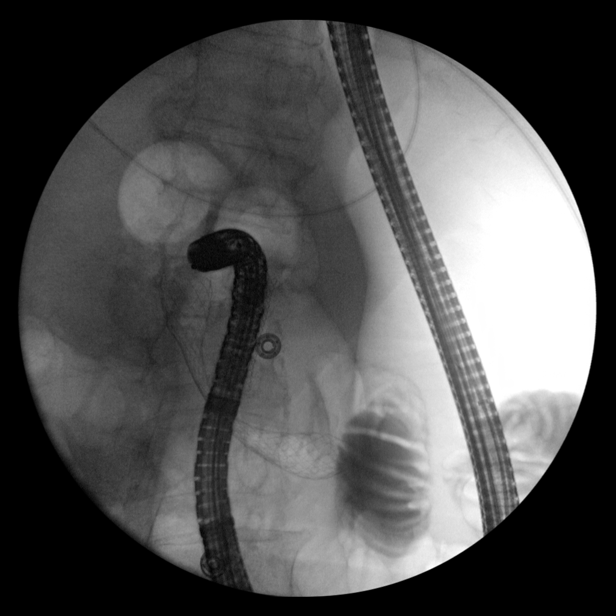
[im 29/29]
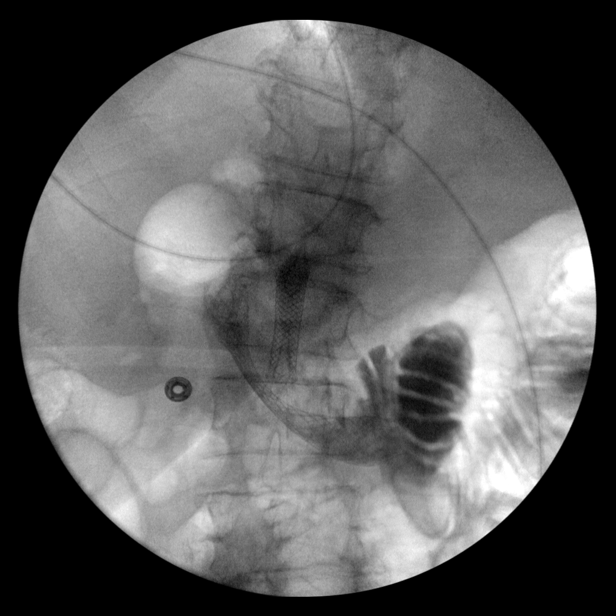

[15 of 24 positions shown; findings below may reference images not displayed]

FINDINGS: We are provided with a series of fluoroscopic intraoperative spot
views of the upper abdomen. Images demonstrate placement of a
duodenal stent. Previously seen common bile duct stent again noted.
No acute abnormality is identified.
IMPRESSION: Intraoperative imaging for duodenal stent placement. Please see
operative report for details of the procedure.

## 2019-08-17 SURGERY — ESOPHAGOGASTRODUODENOSCOPY (EGD) WITH PROPOFOL
Anesthesia: General

## 2019-08-17 MED ORDER — POTASSIUM CHLORIDE 2 MEQ/ML IV SOLN
INTRAVENOUS | Status: DC
  Filled 2019-08-17 (×9): qty 1000

## 2019-08-17 MED ORDER — LABETALOL HCL 5 MG/ML IV SOLN
INTRAVENOUS | Status: DC | PRN
Start: 2019-08-17 — End: 2019-08-17
  Administered 2019-08-17: 5 mg via INTRAVENOUS

## 2019-08-17 MED ORDER — FENTANYL CITRATE (PF) 250 MCG/5ML IJ SOLN
INTRAMUSCULAR | Status: DC | PRN
  Administered 2019-08-17 (×2): 50 ug via INTRAVENOUS

## 2019-08-17 MED ORDER — LIDOCAINE 2% (20 MG/ML) 5 ML SYRINGE
INTRAMUSCULAR | Status: DC | PRN
  Administered 2019-08-17: 40 mg via INTRAVENOUS

## 2019-08-17 MED ORDER — DEXAMETHASONE SODIUM PHOSPHATE 10 MG/ML IJ SOLN
INTRAMUSCULAR | Status: DC | PRN
  Administered 2019-08-17: 4 mg via INTRAVENOUS

## 2019-08-17 MED ORDER — MORPHINE SULFATE (CONCENTRATE) 10 MG/0.5ML PO SOLN: 5.0000 mg | ORAL | Status: DC | PRN

## 2019-08-17 MED ORDER — ROCURONIUM BROMIDE 10 MG/ML (PF) SYRINGE
PREFILLED_SYRINGE | INTRAVENOUS | Status: DC | PRN
  Administered 2019-08-17: 50 mg via INTRAVENOUS

## 2019-08-17 MED ORDER — GLUCAGON HCL RDNA (DIAGNOSTIC) 1 MG IJ SOLR
INTRAMUSCULAR | Status: DC | PRN
  Administered 2019-08-17 (×2): .5 mg via INTRAVENOUS

## 2019-08-17 MED ORDER — PROPOFOL 1000 MG/100ML IV EMUL
INTRAVENOUS | Status: AC
  Filled 2019-08-17: qty 100

## 2019-08-17 MED ORDER — LACTATED RINGERS IV SOLN: INTRAVENOUS | Status: DC | PRN

## 2019-08-17 MED ORDER — PHENYLEPHRINE 40 MCG/ML (10ML) SYRINGE FOR IV PUSH (FOR BLOOD PRESSURE SUPPORT)
PREFILLED_SYRINGE | INTRAVENOUS | Status: DC | PRN
  Administered 2019-08-17: 80 ug via INTRAVENOUS
  Administered 2019-08-17: 120 ug via INTRAVENOUS

## 2019-08-17 MED ORDER — ESMOLOL HCL 100 MG/10ML IV SOLN
INTRAVENOUS | Status: DC | PRN
Start: 2019-08-17 — End: 2019-08-17
  Administered 2019-08-17: 10 mg via INTRAVENOUS
  Administered 2019-08-17: 30 mg via INTRAVENOUS
  Administered 2019-08-17: 20 mg via INTRAVENOUS

## 2019-08-17 MED ORDER — DEXTROSE 50 % IV SOLN
INTRAVENOUS | Status: AC
  Filled 2019-08-17: qty 50

## 2019-08-17 MED ORDER — LABETALOL HCL 5 MG/ML IV SOLN
5.0000 mg | INTRAVENOUS | Status: DC | PRN
  Filled 2019-08-17: qty 4

## 2019-08-17 MED ORDER — PROPOFOL 500 MG/50ML IV EMUL
INTRAVENOUS | Status: AC
  Filled 2019-08-17: qty 50

## 2019-08-17 MED ORDER — DEXTROSE 50 % IV SOLN
25.0000 mL | Freq: Once | INTRAVENOUS | Status: AC
  Administered 2019-08-17: 25 mL via INTRAVENOUS

## 2019-08-17 MED ORDER — SUGAMMADEX SODIUM 200 MG/2ML IV SOLN
INTRAVENOUS | Status: DC | PRN
  Administered 2019-08-17: 200 mg via INTRAVENOUS

## 2019-08-17 MED ORDER — FENTANYL CITRATE (PF) 100 MCG/2ML IJ SOLN
INTRAMUSCULAR | Status: AC
  Filled 2019-08-17: qty 2

## 2019-08-17 MED ORDER — PROPOFOL 10 MG/ML IV BOLUS
INTRAVENOUS | Status: DC | PRN
  Administered 2019-08-17: 70 mg via INTRAVENOUS

## 2019-08-17 MED ORDER — PROPOFOL 500 MG/50ML IV EMUL
INTRAVENOUS | Status: AC
  Filled 2019-08-17: qty 100

## 2019-08-17 MED ORDER — ONDANSETRON HCL 4 MG/2ML IJ SOLN
INTRAMUSCULAR | Status: DC | PRN
  Administered 2019-08-17: 4 mg via INTRAVENOUS

## 2019-08-17 MED ORDER — PHENOL 1.4 % MT LIQD
1.0000 | OROMUCOSAL | Status: DC | PRN
  Filled 2019-08-17 (×2): qty 177

## 2019-08-17 SURGICAL SUPPLY — 15 items

## 2019-08-17 NOTE — Op Note (Signed)
[facility] Patient Name: Debra [name] Procedure Date: 08/17/2019 MRN: [id number] Attending MD: Milus Banister , MD Date of Birth: [birth year] CSN: [id number] Age: 86 Admit Type: Inpatient Procedure:                Upper GI endoscopy Indications:              known ampullary carcinoma, uncovered SEMS placed                            05/2019; now with duodenal obstruction Providers:                Milus Banister, MD, Burtis Junes, RN, Cleda Daub,                            RN Referring MD:              Medicines:                General Anesthesia Complications:            No immediate complications. Estimated blood loss:                            None. Estimated Blood Loss:     Estimated blood loss: none. Procedure:                After obtaining informed consent, the endoscope was                            passed under direct vision. Throughout the                            procedure, the patient's blood pressure, pulse, and                            oxygen saturations were monitored continuously. The                            [id number]-[id number] ([id number]) Olympus therapeutic endoscope                            was introduced through the mouth, and advanced to                            the second part of duodenum. The GIF-[id number]                            ([id number]) Olympus double channel endoscope was                            introduced through the mouth, and advanced to the                            second part of duodenum. The GIF-[id number] ([id number])                            Olympus double  channel endoscope was introduced                            through the mouth, and advanced to the second part                            of duodenum. The upper GI endoscopy was                            accomplished without difficulty. The patient                            tolerated the procedure well. Scope In: Scope Out: Findings:      There was a small amount of  solid food in the UGI tract.      Scout film showed the previously placed metal biliary stent in good       position in RUQ. I advanced a therapeutic EGD scope into the duodenum.       The proximal edge of the known ampullary cancer was located just distal       to the duodenal bulb. The cancer is causing near complete lumen       obstruction. I was unable to ever view the previously placed biliary       stent by endoscopy. I attempted to pass a biliary wire through the       stricture but could not guide it through using the gastroscope. This was       also the case with a side viewing duodenoscope. I finally used an adult       colonoscope and was able to get the biliary wire through the malignant       stricture. I used a biliary catheter over the wire to inject contrast,       confirming that the wire was indeed in the distal duodenum. Contrast       delineated an apparently 3-4cm long duodenal stricture, centered at the       distal end of the previously existing metal biliary stent. I then       attempted to place a 9cm long 48mm diameter duodenal stent however the       proximal end of the stent could not negotiate a rather angulated       duodenum lumen (distal to the stricture) and so instead I placed a 6cm       long 22cm uncovered duodenal Wallstent across the stricture in apparent       good position. 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 This was stented with a 6cm long 26mm  diameter uncovered metal Wallstent in apparent good                            position via fluoroscopy. Moderate Sedation:      Not Applicable - Patient had care per Anesthesia. Recommendation:           - Return patient to hospital ward for ongoing care.                            - Clear liquids today. Procedure Code(s):        --- Professional ---                           [phone], Esophagogastroduodenoscopy, flexible,                            transoral; with placement of endoscopic stent                            (includes pre- and post-dilation and guide wire                            passage, when performed) Diagnosis Code(s):        --- Professional ---                           K31.5, Obstruction of duodenum                           R10.11, Right upper quadrant pain CPT copyright 2019 [employer]. All rights reserved. The codes documented in this report are preliminary and upon coder review may  be revised to meet current compliance requirements. Milus Banister, MD 08/17/2019 11:39:06 AM This report has been signed electronically. Number of Addenda: 0

## 2019-08-17 NOTE — Progress Notes (Signed)
Pt pulled out her NG tube. Daughter asked if we could leave it out for now because it had been mentioned that it may be removed once she has a stent placed. MD was notified and ordered to keep NG tube out for now.

## 2019-08-17 NOTE — Anesthesia Procedure Notes (Signed)
Procedure Name: Intubation Date/Time: 08/17/2019 10:00 AM Performed by: Mitzie Na, CRNA Pre-anesthesia Checklist: Patient identified, Emergency Drugs available, Suction available and Patient being monitored Patient Re-evaluated:Patient Re-evaluated prior to induction Oxygen Delivery Method: Circle system utilized Preoxygenation: Pre-oxygenation with 100% oxygen Induction Type: IV induction Ventilation: Mask ventilation without difficulty Laryngoscope Size: Mac and 3 Grade View: Grade I Tube type: Oral Tube size: 7.0 mm Number of attempts: 1 Airway Equipment and Method: Stylet Placement Confirmation: ETT inserted through vocal cords under direct vision,  positive ETCO2 and breath sounds checked- equal and bilateral Secured at: 24 cm Tube secured with: Tape Dental Injury: Teeth and Oropharynx as per pre-operative assessment

## 2019-08-17 NOTE — Anesthesia Postprocedure Evaluation (Signed)
Anesthesia Post Note  Patient: Debra [name]  Procedure(s) Performed: ESOPHAGOGASTRODUODENOSCOPY (EGD) WITH PROPOFOL (N/A ) DUODENAL STENT PLACEMENT (N/A )     Patient location during evaluation: Endoscopy Anesthesia Type: General Level of consciousness: awake and alert Pain management: pain level controlled Vital Signs Assessment: post-procedure vital signs reviewed and stable Respiratory status: spontaneous breathing, nonlabored ventilation, respiratory function stable and patient connected to nasal cannula oxygen Cardiovascular status: blood pressure returned to baseline and stable Postop Assessment: no apparent nausea or vomiting Anesthetic complications: no   No complications documented.  Last Vitals:  Vitals:   08/17/19 1150 08/17/19 1200  BP: (!) 164/81 (!) 170/111  Pulse: 78 90  Resp: 20 15  Temp:    SpO2: 100% 91%    Last Pain:  Vitals:   08/17/19 1200  TempSrc:   PainSc: 0-No pain                 [name]

## 2019-08-17 NOTE — Transfer of Care (Signed)
Immediate Anesthesia Transfer of Care Note  Patient: Debra [name]  Procedure(s) Performed: ESOPHAGOGASTRODUODENOSCOPY (EGD) WITH PROPOFOL (N/A ) DUODENAL STENT PLACEMENT (N/A )  Patient Location: PACU  Anesthesia Type:General  Level of Consciousness: drowsy  Airway & Oxygen Therapy: Patient Spontanous Breathing and Patient connected to face mask  Post-op Assessment: Report given to RN and Post -op Vital signs reviewed and stable  Post vital signs: Reviewed and stable  Last Vitals:  Vitals Value Taken Time  BP    Temp    Pulse 77 08/17/19 1137  Resp 16 08/17/19 1137  SpO2 100 % 08/17/19 1137  Vitals shown include unvalidated device data.  Last Pain:  Vitals:   08/17/19 0917  TempSrc: Oral  PainSc: 0-No pain         Complications: No complications documented.

## 2019-08-17 NOTE — Progress Notes (Signed)
Debra [name]   DOB:[birth year]   ZO#:[id number]   JWJ#:[id number]  Oncology follow up   Subjective: Patient underwent duodenal stent placement by Dr. Ardis Hughs today and tolerated the procedure well.  She had 2 good bowel movement after procedure, still has intermittent abdominal pain.  Daughter at the bedside.   Objective:  Vitals:   08/17/19 1150 08/17/19 1200  BP: (!) 164/81 (!) 170/111  Pulse: 78 90  Resp: 20 15  Temp:    SpO2: 100% 91%    Body mass index is 23.13 kg/m.  Intake/Output Summary (Last 24 hours) at 08/17/2019 1748 Last data filed at 08/17/2019 1123 Gross per 24 hour  Intake 1847.28 ml  Output 0 ml  Net 1847.28 ml     Sclerae unicteric  Oropharynx clear  No peripheral adenopathy  Lungs clear -- no rales or rhonchi  Heart regular rate and rhythm  Abdomen soft and nontender   MSK no focal spinal tenderness, no peripheral edema  Neuro nonfocal   CBG (last 3)  Recent Labs    08/17/19 0926 08/17/19 0955  GLUCAP 68* 116*     Labs:  Urine Studies No results for input(s): UHGB, CRYS in the last 72 hours.  Invalid input(s): UACOL, UAPR, USPG, UPH, UTP, UGL, UKET, UBIL, UNIT, UROB, ULEU, UEPI, UWBC, URBC, UBAC, CAST, UCOM, [place]  Basic Metabolic Panel: Recent Labs  Lab 08/15/19 1239 08/15/19 1239 08/16/19 0036 08/16/19 0900 08/17/19 0554  NA 131*  --  135  --  136  K 3.3*   < > 3.2*  --  3.8  CL 93*  --  99  --  102  CO2 23  --  25  --  24  GLUCOSE 150*  --  99  --  69*  BUN 12  --  10  --  9  CREATININE 0.79  --  0.69  --  0.78  CALCIUM 8.7*  --  8.5*  --  8.5*  MG  --   --   --  1.2* 2.1  PHOS  --   --   --   --  3.1   < > = values in this interval not displayed.   GFR Estimated Creatinine Clearance: 44.6 mL/min (by C-G formula based on SCr of 0.78 mg/dL). Liver Function Tests: Recent Labs  Lab 08/15/19 1239 08/17/19 0554  AST 26 18  ALT 14 13  ALKPHOS 74 61  BILITOT 0.5 0.9  PROT 6.9 5.8*  ALBUMIN 2.9* 2.6*   Recent Labs  Lab  08/15/19 1239  LIPASE 47   No results for input(s): AMMONIA in the last 168 hours. Coagulation profile Recent Labs  Lab 08/15/19 1239  INR 1.0    CBC: Recent Labs  Lab 08/15/19 1239 08/16/19 0036 08/17/19 0554  WBC 6.5 6.4 5.7  NEUTROABS 3.3  --  2.3  HGB 12.1 11.2* 10.2*  HCT 35.7* 33.3* 30.1*  MCV 89.5 90.5 91.2  PLT 418* 368 363   Cardiac Enzymes: No results for input(s): CKTOTAL, CKMB, CKMBINDEX, TROPONINI in the last 168 hours. BNP: Invalid input(s): POCBNP CBG: Recent Labs  Lab 08/17/19 0926 08/17/19 0955  GLUCAP 68* 116*   D-Dimer No results for input(s): DDIMER in the last 72 hours. Hgb A1c No results for input(s): HGBA1C in the last 72 hours. Lipid Profile No results for input(s): CHOL, HDL, LDLCALC, TRIG, CHOLHDL, LDLDIRECT in the last 72 hours. Thyroid function studies No results for input(s): TSH, T4TOTAL, T3FREE, THYROIDAB in the last 72 hours.  Invalid input(s): FREET3 Anemia work up No results for input(s): VITAMINB12, FOLATE, FERRITIN, TIBC, IRON, RETICCTPCT in the last 72 hours. Microbiology Recent Results (from the past 240 hour(s))  SARS Coronavirus 2 by RT PCR (hospital order, performed in [facility] hospital lab) Nasopharyngeal Nasopharyngeal Swab     Status: None   Collection Time: 08/15/19  3:46 PM   Specimen: Nasopharyngeal Swab  Result Value Ref Range Status   SARS Coronavirus 2 NEGATIVE NEGATIVE Final    Comment: (NOTE) SARS-CoV-2 target nucleic acids are NOT DETECTED.  The SARS-CoV-2 RNA is generally detectable in upper and lower respiratory specimens during the acute phase of infection. The lowest concentration of SARS-CoV-2 viral copies this assay can detect is 250 copies / mL. A negative result does not preclude SARS-CoV-2 infection and should not be used as the sole basis for treatment or other patient management decisions.  A negative result may occur with improper specimen collection / handling, submission of specimen  other than nasopharyngeal swab, presence of viral mutation(s) within the areas targeted by this assay, and inadequate number of viral copies (<250 copies / mL). A negative result must be combined with clinical observations, patient history, and epidemiological information.  Fact Sheet for Patients:   StrictlyIdeas.no  Fact Sheet for Healthcare Providers: BankingDealers.co.za  This test is not yet approved or  cleared by the [place] FDA and has been authorized for detection and/or diagnosis of SARS-CoV-2 by FDA under an Emergency Use Authorization (EUA).  This EUA will remain in effect (meaning this test can be used) for the duration of the COVID-19 declaration under Section 564(b)(1) of the Act, 21 U.S.C. section 360bbb-3(b)(1), unless the authorization is terminated or revoked sooner.  Performed at [facility], [address] [place], [place] [address]       Studies:  DG C-Arm 1-60 Min  Result Date: 08/17/2019 CLINICAL DATA:  Intraoperative imaging for duodenal stent placement. EXAM: DG C-ARM 1-60 MIN COMPARISON:  CT abdomen and pelvis 08/15/2019. FINDINGS: We are provided with a series of fluoroscopic intraoperative spot views of the upper abdomen. Images demonstrate placement of a duodenal stent. Previously seen common bile duct stent again noted. No acute abnormality is identified. IMPRESSION: Intraoperative imaging for duodenal stent placement. Please see operative report for details of the procedure. Electronically Signed   By: Inge Rise M.D.   On: 08/17/2019 12:18    Assessment: 86 y.o. female   1.  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  Ampullary carcinoma, status post palliative radiation, on supportive care now 3.  Dementia 4.  Hypertension and type 2 diabetes  Plan:  -I appreciate Dr. Audelia Acton help, and hope the stent will improve  her nausea and she can tolerate oral intake.  I recommend residual diet, will ask dietitian to see her -Her daughter [name] was at bedside.  I explained to her that her ampullary cancer will continue growing, and she will develop obstruction again despite the stent in good position. I do not recommend surgery or tube feeds due to her age and dementia.  I recommend home hospice to manage her symptoms tolerable. [name] is in agreement with this but states her other siblings are still in denial and may not agree with hospice.  I plan to meet them on Wednesday around noon -continue supportive care. I will add oral morphine to see if she can tolerate    Truitt Merle, MD 08/17/2019  5:48 PM

## 2019-08-17 NOTE — Interval H&P Note (Signed)
History and Physical Interval Note:  08/17/2019 9:12 AM  Debra [name]  has presented today for surgery, with the diagnosis of Duodenal obstruction.  The various methods of treatment have been discussed with the patient and family. After consideration of risks, benefits and other options for treatment, the patient has consented to  Procedure(s): ESOPHAGOGASTRODUODENOSCOPY (EGD) WITH PROPOFOL (N/A) as a surgical intervention.  The patient's history has been reviewed, patient examined, no change in status, stable for surgery.  I have reviewed the patient's chart and labs.  Questions were answered to the patient's satisfaction.     Milus Banister

## 2019-08-17 NOTE — Progress Notes (Signed)
PROGRESS NOTE    Debra [name]  PPJ:[id number] DOB: 23-Nov-1930 DOA: 08/15/2019 PCP: Asencion Noble, MD   Chief Complaint  Patient presents with  . Abdominal Pain    Brief Narrative:  Debra [name] is Debra [name] 86 y.o. female with medical history significant for Ampullary carinoma and s/p CBD stent in 05/2019 s/p radiation, hypertension, type 2 diabetes, TIA and hyperlipidemia who presents with concerns of nausea and vomiting.  Patient has been noting some sharp umbilical pain for the past several days and then today had an episode of vomiting.  She decided to present to [place] ED since her oncologist has told her in the past to be evaluated if any vomiting occurs.  Otherwise, she denies any fever or diarrhea.  Patient has history of ampullary carcinoma with CBD stent and just finished radiation in June. CT abdomen pelvis in the ED was significant for finding  suggestive of invasive/obstruction of the duodenum from the ampulla carcinoma.  Unfortunately, per oncology documentation patient not Kyrielle Urbanski candidate for surgery or chemotherapy due to advanced age and multiple comorbidities including dementia. Dr.feng is her primary oncologist. Currently she has Takeshi Teasdale home palliative care nurse and family is actively trying to select one HCPOA since patient has dementia.  Labs otherwise showed no leukocytosis with mild thrombocytosis of 418.  Lactic acid of 4.9 down to 3.9 with 500cc NS fluids. Sodium of 131, glucose of 150, K of 3.3, normal creatinine 0.79, calcium 8.7, albumin 2.9.  NG tube was inserted and patient was requested to be transferred to [facility] long from [place] ED.  Assessment & Plan:   Principal Problem:   SBO (small bowel obstruction) (HCC) Active Problems:   HLD (hyperlipidemia)   HTN (hypertension)   DM (diabetes mellitus), type 2 (HCC)   Pain in the abdomen   High serum lactate   Thrombocytosis (HCC)   Malignant neoplasm of head of pancreas  (HCC)  Mechanical duodenal obstruction secondary to known ampulla carcinoma CT with low density mass at the region of pancreatic head and ampulla with findings suggestive of invasion/obstruction of the duodenal lumen.  Findings compatible with mechanical outlet obstruction. PRN antiemetic.  PRN pain control  Oncology c/s, appreciate recs GI c/s, s/p stenting today by GI - clear liquid diet  Hypokalemia: place in maintenance fluids, follow mag - resolved  Hypoglycemia  T2DM: likely 2/2 NPO status, place D5 in fluids, follow after stenting  Elevated lactate resolved  Mild hyponatremia Due to vomiting and decreased p.o. intake Improved  Mild thrombocytosis Improved  HTN BP up this afternoon, no BP meds on med list - Bps around noon not consistent with that of previous day, will repeat and follow, start BP meds as needed Prn BP meds  DVT prophylaxis:lovenox Code Status: full  Family Communication: [name], daughter at bedside Disposition:   Status is: Inpatient  Remains inpatient appropriate because:Inpatient level of care appropriate due to severity of illness   Dispo: The patient is from: Home              Anticipated d/c is to: pending              Anticipated d/c date is: > 3 days              Patient currently is not medically stable to d/c.   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 This was stented with Koriana Stepien 6cm long 67mm diameter uncovered metal Wallstent in apparent good position via fluoroscopy. Recommendation - Return patient to hospital ward for ongoing care. - Clear liquids today.  Antimicrobials:  Anti-infectives (From admission, onward)   None     Subjective: Confused post op Family at bedside  Objective: Vitals:   08/17/19 1138 08/17/19 1140 08/17/19  1150 08/17/19 1200  BP: (!) 165/70 (!) 166/69 (!) 164/81 (!) 170/111  Pulse: 77 77 78 90  Resp: 15 15 20 15   Temp: 97.7 F (36.5 C)     TempSrc: Axillary     SpO2: 100% 100% 100% 91%  Weight:      Height:        Intake/Output Summary (Last 24 hours) at 08/17/2019 1900 Last data filed at 08/17/2019 1123 Gross per 24 hour  Intake 1847.28 ml  Output 0 ml  Net 1847.28 ml   Filed Weights   08/15/19 1135  Weight: 63 kg    Examination:  General: No acute distress. Cardiovascular: Heart sounds show Hughes Wyndham regular rate, and rhythm. Lungs: Clear to auscultation bilaterally  Abdomen: Soft, nontender, nondistended Neurological: Alert and confused. Moves all extremities 4. Cranial nerves II through XII grossly intact. Skin: Warm and dry. No rashes or lesions. Extremities: No clubbing or cyanosis. No edema   Data Reviewed: I have personally reviewed following labs and imaging studies  CBC: Recent Labs  Lab 08/15/19 1239 08/16/19 0036 08/17/19 0554  WBC 6.5 6.4 5.7  NEUTROABS 3.3  --  2.3  HGB 12.1 11.2* 10.2*  HCT 35.7* 33.3* 30.1*  MCV 89.5 90.5 91.2  PLT 418* 368 010    Basic Metabolic Panel: Recent Labs  Lab 08/15/19 1239 08/16/19 0036 08/16/19 0900 08/17/19 0554  NA 131* 135  --  136  K 3.3* 3.2*  --  3.8  CL 93* 99  --  102  CO2 23 25  --  24  GLUCOSE 150* 99  --  69*  BUN 12 10  --  9  CREATININE 0.79 0.69  --  0.78  CALCIUM 8.7* 8.5*  --  8.5*  MG  --   --  1.2* 2.1  PHOS  --   --   --  3.1    GFR: Estimated Creatinine Clearance: 44.6 mL/min (by C-G formula based on SCr of 0.78 mg/dL).  Liver Function Tests: Recent Labs  Lab 08/15/19 1239 08/17/19 0554  AST 26 18  ALT 14 13  ALKPHOS 74 61  BILITOT 0.5 0.9  PROT 6.9 5.8*  ALBUMIN 2.9* 2.6*    CBG: Recent Labs  Lab 08/17/19 0926 08/17/19 0955  GLUCAP 68* 116*     Recent Results (from the past 240 hour(s))  SARS Coronavirus 2 by RT PCR (hospital order, performed in [facility] hospital  lab) Nasopharyngeal Nasopharyngeal Swab     Status: None   Collection Time: 08/15/19  3:46 PM   Specimen: Nasopharyngeal Swab  Result Value Ref Range Status   SARS Coronavirus 2 NEGATIVE NEGATIVE Final    Comment: (NOTE) SARS-CoV-2 target nucleic acids are NOT DETECTED.  The SARS-CoV-2 RNA is generally detectable in upper and lower respiratory specimens during the acute phase of infection. The lowest concentration of SARS-CoV-2 viral copies this assay can detect is 250 copies / mL. Sada Mazzoni negative result does not preclude SARS-CoV-2 infection and should not be used as the sole basis for treatment or other patient management  decisions.  Treson Laura negative result may occur with improper specimen collection / handling, submission of specimen other than nasopharyngeal swab, presence of viral mutation(s) within the areas targeted by this assay, and inadequate number of viral copies (<250 copies / mL). Rockne Dearinger negative result must be combined with clinical observations, patient history, and epidemiological information.  Fact Sheet for Patients:   StrictlyIdeas.no  Fact Sheet for Healthcare Providers: BankingDealers.co.za  This test is not yet approved or  cleared by the [place] FDA and has been authorized for detection and/or diagnosis of SARS-CoV-2 by FDA under an Emergency Use Authorization (EUA).  This EUA will remain in effect (meaning this test can be used) for the duration of the COVID-19 declaration under Section 564(b)(1) of the Act, 21 U.S.C. section 360bbb-3(b)(1), unless the authorization is terminated or revoked sooner.  Performed at [facility], [address], [place], [place] [address]          Radiology Studies: DG C-Arm 1-60 Min  Result Date: 08/17/2019 CLINICAL DATA:  Intraoperative imaging for duodenal stent placement. EXAM: DG C-ARM 1-60 MIN COMPARISON:  CT abdomen and pelvis 08/15/2019. FINDINGS: We are provided  with Hanan Moen series of fluoroscopic intraoperative spot views of the upper abdomen. Images demonstrate placement of Omelia Marquart duodenal stent. Previously seen common bile duct stent again noted. No acute abnormality is identified. IMPRESSION: Intraoperative imaging for duodenal stent placement. Please see operative report for details of the procedure. Electronically Signed   By: Inge Rise M.D.   On: 08/17/2019 12:18        Scheduled Meds: . enoxaparin (LOVENOX) injection  40 mg Subcutaneous Q24H  . latanoprost  1 drop Both Eyes QHS  . pantoprazole (PROTONIX) IV  40 mg Intravenous Q24H   Continuous Infusions: . dextrose 5 % lactated ringers with kcl 75 mL/hr at 08/17/19 1530     LOS: 2 days    Time spent: over 30 min    Fayrene Helper, MD [facility]   To contact the attending provider between 7A-7P or the covering provider during after hours 7P-7A, please log into the web site [email or web address] and access using universal [place] password for that web site. If you do not have the password, please call the hospital operator.  08/17/2019, 7:00 PM

## 2019-08-17 NOTE — Anesthesia Preprocedure Evaluation (Addendum)
Anesthesia Evaluation  Patient identified by MRN, date of birth, ID band Patient awake    Reviewed: Allergy & Precautions, NPO status , Patient's Chart, lab work & pertinent test results  Airway Mallampati: II  TM Distance: >3 FB Neck ROM: Full    Dental  (+) Edentulous Upper, Edentulous Lower   Pulmonary neg pulmonary ROS,    Pulmonary exam normal breath sounds clear to auscultation       Cardiovascular hypertension, Pt. on medications Normal cardiovascular exam Rhythm:Regular Rate:Normal  TTE 05/2019 EF 60%, valves ok   Neuro/Psych TIAnegative psych ROS   GI/Hepatic Neg liver ROS, Ampullary carinoma and s/p CBD stent in 05/2019 s/p radiation   Endo/Other  negative endocrine ROSdiabetes, Type 2, Insulin Dependent, Oral Hypoglycemic Agents  Renal/GU Renal InsufficiencyRenal disease  negative genitourinary   Musculoskeletal negative musculoskeletal ROS (+)   Abdominal   Peds  Hematology negative hematology ROS (+)   Anesthesia Other Findings   Reproductive/Obstetrics                            Anesthesia Physical Anesthesia Plan  ASA: III  Anesthesia Plan: General   Post-op Pain Management:    Induction: Intravenous  PONV Risk Score and Plan: 3 and Ondansetron and Treatment may vary due to age or medical condition  Airway Management Planned: Oral ETT  Additional Equipment:   Intra-op Plan:   Post-operative Plan: Extubation in OR  Informed Consent: I have reviewed the patients History and Physical, chart, labs and discussed the procedure including the risks, benefits and alternatives for the proposed anesthesia with the patient or authorized representative who has indicated his/her understanding and acceptance.     Dental advisory given  Plan Discussed with: CRNA  Anesthesia Plan Comments:         Anesthesia Quick Evaluation

## 2019-08-18 ENCOUNTER — Encounter (HOSPITAL_COMMUNITY): Payer: Self-pay | Admitting: Gastroenterology

## 2019-08-18 LAB — MAGNESIUM: Magnesium: 1.5 mg/dL — ABNORMAL LOW (ref 1.7–2.4)

## 2019-08-18 LAB — CBC WITH DIFFERENTIAL/PLATELET
Abs Immature Granulocytes: 0.09 10*3/uL — ABNORMAL HIGH (ref 0.00–0.07)
Basophils Absolute: 0.1 10*3/uL (ref 0.0–0.1)
Basophils Relative: 1 %
Eosinophils Absolute: 0 10*3/uL (ref 0.0–0.5)
Eosinophils Relative: 0 %
HCT: 32 % — ABNORMAL LOW (ref 36.0–46.0)
Hemoglobin: 10.9 g/dL — ABNORMAL LOW (ref 12.0–15.0)
Immature Granulocytes: 1 %
Lymphocytes Relative: 35 %
Lymphs Abs: 2.7 10*3/uL (ref 0.7–4.0)
MCH: 30.4 pg (ref 26.0–34.0)
MCHC: 34.1 g/dL (ref 30.0–36.0)
MCV: 89.4 fL (ref 80.0–100.0)
Monocytes Absolute: 0.8 10*3/uL (ref 0.1–1.0)
Monocytes Relative: 11 %
Neutro Abs: 3.9 10*3/uL (ref 1.7–7.7)
Neutrophils Relative %: 52 %
Platelets: 449 10*3/uL — ABNORMAL HIGH (ref 150–400)
RBC: 3.58 MIL/uL — ABNORMAL LOW (ref 3.87–5.11)
RDW: 14.6 % (ref 11.5–15.5)
WBC: 7.6 10*3/uL (ref 4.0–10.5)
nRBC: 0 % (ref 0.0–0.2)

## 2019-08-18 LAB — COMPREHENSIVE METABOLIC PANEL
ALT: 14 U/L (ref 0–44)
AST: 19 U/L (ref 15–41)
Albumin: 2.8 g/dL — ABNORMAL LOW (ref 3.5–5.0)
Alkaline Phosphatase: 66 U/L (ref 38–126)
Anion gap: 10 (ref 5–15)
BUN: 9 mg/dL (ref 8–23)
CO2: 23 mmol/L (ref 22–32)
Calcium: 8.6 mg/dL — ABNORMAL LOW (ref 8.9–10.3)
Chloride: 101 mmol/L (ref 98–111)
Creatinine, Ser: 0.91 mg/dL (ref 0.44–1.00)
GFR calc Af Amer: >60 mL/min (ref >60)
GFR calc non Af Amer: 57 mL/min — ABNORMAL LOW (ref >60)
Glucose, Bld: 140 mg/dL — ABNORMAL HIGH (ref 70–99)
Potassium: 3.9 mmol/L (ref 3.5–5.1)
Sodium: 134 mmol/L — ABNORMAL LOW (ref 135–145)
Total Bilirubin: 0.7 mg/dL (ref 0.3–1.2)
Total Protein: 6 g/dL — ABNORMAL LOW (ref 6.5–8.1)

## 2019-08-18 LAB — GLUCOSE, CAPILLARY
Glucose-Capillary: 181 mg/dL — ABNORMAL HIGH (ref 70–99)
Glucose-Capillary: 126 mg/dL — ABNORMAL HIGH (ref 70–99)

## 2019-08-18 LAB — PHOSPHORUS: Phosphorus: 2.7 mg/dL (ref 2.5–4.6)

## 2019-08-18 MED ORDER — TRAZODONE HCL 50 MG PO TABS
50.0000 mg | ORAL_TABLET | Freq: Every day | ORAL | Status: DC
  Administered 2019-08-18 – 2019-08-19 (×2): 50 mg via ORAL
  Filled 2019-08-18 (×2): qty 1

## 2019-08-18 MED ORDER — GLUCERNA SHAKE PO LIQD
237.0000 mL | Freq: Three times a day (TID) | ORAL | Status: DC
  Administered 2019-08-18 – 2019-08-20 (×5): 237 mL via ORAL
  Filled 2019-08-18 (×8): qty 237

## 2019-08-18 NOTE — Progress Notes (Addendum)
Progress Note   Subjective  Chief Complaint: Duodenal obstruction, status post stent placement 08/17/2019  Patient is doing well this morning, per family she has had some abdominal pain yesterday but has had none today.  Tolerated her clear liquid diet this morning and family is asking if she can eat more.   Objective   Vital signs in last 24 hours: Temp:  [97.7 F (36.5 C)-98.4 F (36.9 C)] 97.8 F (36.6 C) (08/03 0615) Pulse Rate:  [77-104] 83 (08/03 0615) Resp:  [15-20] 16 (08/03 0615) BP: (145-170)/(69-111) 145/71 (08/03 0615) SpO2:  [77 %-100 %] 100 % (08/03 0615) Last BM Date: 08/17/19 General:    Elderly AA female in NAD Heart:  Regular rate and rhythm; no murmurs Lungs: Respirations even and unlabored, lungs CTA bilaterally Abdomen:  Soft, nontender and nondistended. Normal bowel sounds. Extremities:  Without edema. Neurologic:  Alert and oriented,  grossly normal neurologically. Psych:  Cooperative. Normal mood  Lab Results: Recent Labs    08/16/19 0036 08/17/19 0554 08/18/19 1002  WBC 6.4 5.7 7.6  HGB 11.2* 10.2* 10.9*  HCT 33.3* 30.1* 32.0*  PLT 368 363 449*   BMET Recent Labs    08/16/19 0036 08/17/19 0554 08/18/19 1002  NA 135 136 134*  K 3.2* 3.8 3.9  CL 99 102 101  CO2 25 24 23   GLUCOSE 99 69* 140*  BUN 10 9 9   CREATININE 0.69 0.78 0.91  CALCIUM 8.5* 8.5* 8.6*   LFT Recent Labs    08/18/19 1002  PROT 6.0*  ALBUMIN 2.8*  AST 19  ALT 14  ALKPHOS 66  BILITOT 0.7   PT/INR Recent Labs    08/15/19 1239  LABPROT 13.1  INR 1.0    Studies/Results: DG C-Arm 1-60 Min  Result Date: 08/17/2019 CLINICAL DATA:  Intraoperative imaging for duodenal stent placement. EXAM: DG C-ARM 1-60 MIN COMPARISON:  CT abdomen and pelvis 08/15/2019. FINDINGS: We are provided with a series of fluoroscopic intraoperative spot views of the upper abdomen. Images demonstrate placement of a duodenal stent. Previously seen common bile duct stent again noted. No  acute abnormality is identified. IMPRESSION: Intraoperative imaging for duodenal stent placement. Please see operative report for details of the procedure. Electronically Signed   By: Inge Rise M.D.   On: 08/17/2019 12:18       Assessment / Plan:   Assessment: 1.  Duodenal obstruction secondary to ampullary carcinoma: Status post stent 08/17/2019, doing well 2.  Ampullary carcinoma: Status post palliative radiation on supportive care, biliary stent placed 05/2019 for biliary obstruction 3.  Dementia:  Plan: 1.  Patient is status post EGD with enteral stent placement on 08/17/2019 with Dr. Ardis Hughs.  She is doing well this morning and tolerating her clear liquid diet. 2.  We will discuss increasing to full liquid diet. 3.  Please await any final recommendations from Dr. Ardis Hughs later today.  Thank you for kind consultation.  We will likely sign off today.   LOS: 3 days   Levin Erp  08/18/2019, 10:48 AM   ________________________________________________________________________  Velora Heckler GI MD note:  I personally examined the patient, reviewed the data and agree with the assessment and plan described above. The duodenal stent seems to be functioning quite well as she has tolerated clear liquids yesterday and full liquids so far today.  OK for d/c later today or tomorrow. Should try to only eat soft consistency foods or thinner, indefinitely.  Please call or page with any further questions or  concerns.  Owens Loffler, MD [facility] Gastroenterology Pager [phone]

## 2019-08-18 NOTE — Progress Notes (Signed)
PROGRESS NOTE    Debra [name]  EXB:[id number] DOB: 06-05-1930 DOA: 08/15/2019 PCP: Asencion Noble, MD   Chief Complaint  Patient presents with  . Abdominal Pain    Brief Narrative:  Debra [name] is Yazlyn Debra [name] 86 y.o. female with medical history significant for Ampullary carinoma and s/p CBD stent in 05/2019 s/p radiation, hypertension, type 2 diabetes, TIA and hyperlipidemia who presents with concerns of nausea and vomiting.  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  She's now s/p stenting by GI.  Plans at this point for goals of care discussion per oncology with family on 8/4.    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  Findings compatible with mechanical outlet obstruction. PRN antiemetic.  PRN pain control  Oncology c/s, appreciate recs - planning for goals of care discussion per oncology, Wed at noon to discuss possibly hospice (per Dr. Burr Medico note, [name] in agreement with hospice, but other siblings may not be there yet) GI c/s, s/p stenting today by GI - clear liquid diet - per GI  Dementia: Delirium precautions She's up and down at night - at home as well, will start trazodone for sleep  Hypokalemia: place in maintenance fluids, follow mag - resolved  Hypoglycemia  T2DM: likely 2/2 NPO status, place D5 in fluids, follow after stenting  Elevated lactate resolved  Mild hyponatremia Due to  vomiting and decreased p.o. intake Improved  Mild thrombocytosis Improved  HTN BP reasonable Not on meds Continue to monitor  DVT prophylaxis:lovenox Code Status: full  Family Communication: [name], daughter at bedside Disposition:   Status is: Inpatient  Remains inpatient appropriate because:Inpatient level of care appropriate due to severity of illness   Dispo: The patient is from: Home              Anticipated d/c is to: pending              Anticipated d/c date is: > 3 days              Patient currently is not medically stable to d/c.   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 This was stented with Leory Allinson 6cm long 58mm diameter uncovered metal Wallstent in apparent good position via fluoroscopy. Recommendation - Return patient to hospital ward for ongoing care. - Clear liquids today.  Antimicrobials:  Anti-infectives (From admission, onward)   None     Subjective: C/o throat soreness after NG tube Didn't eat much this AM, did well last night, no N/V since stenting  Objective: Vitals:   08/17/19 1200 08/17/19 1919 08/17/19 2114 08/18/19 0615  BP: (!) 170/111 (!) 146/78 (!) 147/101 (!) 145/71  Pulse: 90 94 (!) 104 83  Resp: 15  16 16   Temp:   98.4 F (36.9 C) 97.8 F (36.6 C)  TempSrc:   Oral Oral  SpO2: 91%  (!) 77% 100%  Weight:      Height:        Intake/Output Summary (Last 24 hours) at 08/18/2019 1017 Last data filed at 08/18/2019 0900 Gross per 24 hour  Intake 2257.5 ml  Output 0 ml  Net 2257.5 ml   Filed Weights   08/15/19 1135  Weight: 63 kg    Examination:  General: No acute distress. Cardiovascular: Heart sounds show Atavia Poppe regular rate, and rhythm Lungs: Clear to auscultation bilaterally  Abdomen: Soft, nontender, nondistended Neurological: Alert  and disoirented. Moves all extremities 4. Cranial nerves II through XII grossly intact. Skin: Warm and dry. No rashes or lesions. Extremities: No clubbing or cyanosis. No edema.   Data Reviewed: I have personally reviewed following labs and imaging studies  CBC: Recent Labs  Lab 08/15/19 1239 08/16/19 0036 08/17/19 0554 08/18/19 1002  WBC 6.5 6.4 5.7 7.6  NEUTROABS 3.3  --  2.3 3.9  HGB 12.1 11.2* 10.2* 10.9*  HCT 35.7* 33.3* 30.1* 32.0*  MCV 89.5 90.5 91.2 89.4  PLT 418* 368 363 449*    Basic Metabolic Panel: Recent Labs  Lab 08/15/19 1239 08/16/19 0036 08/16/19 0900 08/17/19 0554  NA 131* 135  --  136  K 3.3* 3.2*  --  3.8  CL 93* 99  --  102  CO2 23 25  --  24  GLUCOSE 150* 99  --  69*  BUN 12 10  --  9  CREATININE 0.79 0.69  --  0.78  CALCIUM 8.7* 8.5*  --  8.5*  MG  --   --  1.2* 2.1  PHOS  --   --   --  3.1    GFR: Estimated Creatinine Clearance: 44.6 mL/min (by C-G formula based on SCr of 0.78 mg/dL).  Liver Function Tests: Recent Labs  Lab 08/15/19 1239 08/17/19 0554  AST 26 18  ALT 14 13  ALKPHOS 74 61  BILITOT 0.5 0.9  PROT 6.9 5.8*  ALBUMIN 2.9* 2.6*    CBG: Recent Labs  Lab 08/17/19 0926 08/17/19 0955  GLUCAP 68* 116*     Recent Results (from the past 240 hour(s))  SARS Coronavirus 2 by RT PCR (hospital order, performed in [place] hospital lab) Nasopharyngeal Nasopharyngeal Swab     Status: None   Collection Time: 08/15/19  3:46 PM   Specimen: Nasopharyngeal Swab  Result Value Ref Range Status   SARS Coronavirus 2 NEGATIVE NEGATIVE Final    Comment: (NOTE) SARS-CoV-2 target nucleic acids are NOT DETECTED.  The SARS-CoV-2 RNA is generally detectable in upper and lower respiratory specimens during the acute phase of infection. The lowest concentration of SARS-CoV-2 viral copies this assay can detect is 250 copies / mL. Pansie Guggisberg negative result does not preclude SARS-CoV-2 infection and should not be used as the sole basis for  treatment or other patient management decisions.  Joya Willmott negative result may occur with improper specimen collection / handling, submission of specimen other than nasopharyngeal swab, presence of viral mutation(s) within the areas targeted by this assay, and inadequate number of viral copies (<250 copies / mL). Treven Holtman negative result must be combined with clinical observations, patient history, and epidemiological information.  Fact Sheet for Patients:   StrictlyIdeas.no  Fact Sheet for Healthcare Providers: BankingDealers.co.za  This test is not yet approved or  cleared by the [place] FDA and has been authorized for detection and/or diagnosis of SARS-CoV-2 by FDA under an Emergency  Use Authorization (EUA).  This EUA will remain in effect (meaning this test can be used) for the duration of the COVID-19 declaration under Section 564(b)(1) of the Act, 21 U.S.C. section 360bbb-3(b)(1), unless the authorization is terminated or revoked sooner.  Performed at [facility], [address], [place], [place] [address]          Radiology Studies: DG C-Arm 1-60 Min  Result Date: 08/17/2019 CLINICAL DATA:  Intraoperative imaging for duodenal stent placement. EXAM: DG C-ARM 1-60 MIN COMPARISON:  CT abdomen and pelvis 08/15/2019. FINDINGS: We are provided with Sharan Mcenaney series of fluoroscopic intraoperative spot views of the upper abdomen. Images demonstrate placement of Javiel Canepa duodenal stent. Previously seen common bile duct stent again noted. No acute abnormality is identified. IMPRESSION: Intraoperative imaging for duodenal stent placement. Please see operative report for details of the procedure. Electronically Signed   By: Inge Rise M.D.   On: 08/17/2019 12:18        Scheduled Meds: . enoxaparin (LOVENOX) injection  40 mg Subcutaneous Q24H  . latanoprost  1 drop Both Eyes QHS  . pantoprazole (PROTONIX) IV  40 mg Intravenous Q24H  .  traZODone  50 mg Oral QHS   Continuous Infusions: . dextrose 5 % lactated ringers with kcl 75 mL/hr at 08/17/19 1530     LOS: 3 days    Time spent: over 30 min    Fayrene Helper, MD [facility]   To contact the attending provider between 7A-7P or the covering provider during after hours 7P-7A, please log into the web site [email or web address] and access using universal  password for that web site. If you do not have the password, please call the hospital operator.  08/18/2019, 10:17 AM

## 2019-08-18 NOTE — Progress Notes (Signed)
Initial Nutrition Assessment  DOCUMENTATION CODES:   Severe malnutrition in context of chronic illness  INTERVENTION:   -Glucerna Shake po TID, each supplement provides 220 kcal and 10 grams of protein -Provided "Low fiber nutrition therapy" handouts from [employer]  NUTRITION DIAGNOSIS:   Severe Malnutrition related to chronic illness, cancer and cancer related treatments as evidenced by energy intake < or equal to 75% for > or equal to 1 month, moderate fat depletion, severe muscle depletion.  GOAL:   Patient will meet greater than or equal to 90% of their needs  MONITOR:   PO intake, Supplement acceptance, Labs, Weight trends, I & O's  REASON FOR ASSESSMENT:   Consult Diet education  ASSESSMENT:   86 y.o. female with medical history significant for Ampullary carinoma and s/p CBD stent in 05/2019 s/p radiation, hypertension, type 2 diabetes, TIA and hyperlipidemia who presents with concerns of nausea and vomiting  8/2: s/p duodenal stent placement  Patient in room with sons and daughter at bedside. Pt states she has a good appetite and has been doing well with her fluid diets. Pt was on clear liquids this AM but now diet advanced to fulls. Pt was having abdominal pain and N/V PTA. Pt now able to tolerate fluids.  Pt would like Glucerna shakes. Typically drinks 1-2 a day at home.   Provided low fiber diet handout and reviewed food options with family. Encouraged pt to prioritize protein foods given malnutrition.   Per weight records, pt has lost 5 lbs since 5/3 (3% wt loss x 3 months, insignificant for time frame).   Medications: D5 infusion Labs reviewed: CBGs: 116-181 Low Na, Mg Phos WNL  NUTRITION - FOCUSED PHYSICAL EXAM:    Most Recent Value  Orbital Region Moderate depletion  Upper Arm Region Moderate depletion  Thoracic and Lumbar Region Unable to assess  Buccal Region Moderate depletion  Temple Region Moderate depletion  Clavicle  Bone Region Severe depletion  Clavicle and Acromion Bone Region Severe depletion  Scapular Bone Region Moderate depletion  Dorsal Hand Severe depletion  Patellar Region Moderate depletion  Anterior Thigh Region Severe depletion  Edema (RD Assessment) None  Hair Reviewed  Eyes Reviewed  Mouth Reviewed       Diet Order:   Diet Order            Diet full liquid Room service appropriate? Yes; Fluid consistency: Thin  Diet effective now                 EDUCATION NEEDS:   Education needs have been addressed  Skin:  Skin Assessment: Reviewed RN Assessment  Last BM:  8/2  Height:   Ht Readings from Last 1 Encounters:  08/15/19 5\' 5"  (1.651 m)    Weight:   Wt Readings from Last 1 Encounters:  08/15/19 63 kg   BMI:  Body mass index is 23.13 kg/m.  Estimated Nutritional Needs:   Kcal:  1600-1800  Protein:  75-85g  Fluid:  1.8L/day  Clayton Bibles, MS, RD, LDN Inpatient Clinical Dietitian Contact information available via Amion

## 2019-08-18 NOTE — Care Management Important Message (Signed)
Important Message  Patient Details IM Letter given to the Patient Name: Debra [name] MRN: [id number] Date of Birth: Aug 09, 1930   Medicare Important Message Given:  Yes     Kerin Salen 08/18/2019, 11:24 AM

## 2019-08-19 DIAGNOSIS — K315 Obstruction of duodenum: Principal | ICD-10-CM

## 2019-08-19 LAB — COMPREHENSIVE METABOLIC PANEL
ALT: 13 U/L (ref 0–44)
AST: 17 U/L (ref 15–41)
Albumin: 2.6 g/dL — ABNORMAL LOW (ref 3.5–5.0)
Alkaline Phosphatase: 62 U/L (ref 38–126)
Anion gap: 10 (ref 5–15)
BUN: 5 mg/dL — ABNORMAL LOW (ref 8–23)
CO2: 21 mmol/L — ABNORMAL LOW (ref 22–32)
Calcium: 8.8 mg/dL — ABNORMAL LOW (ref 8.9–10.3)
Chloride: 104 mmol/L (ref 98–111)
Creatinine, Ser: 0.81 mg/dL (ref 0.44–1.00)
GFR calc Af Amer: >60 mL/min (ref >60)
GFR calc non Af Amer: >60 mL/min (ref >60)
Glucose, Bld: 126 mg/dL — ABNORMAL HIGH (ref 70–99)
Potassium: 3.7 mmol/L (ref 3.5–5.1)
Sodium: 135 mmol/L (ref 135–145)
Total Bilirubin: 0.7 mg/dL (ref 0.3–1.2)
Total Protein: 5.7 g/dL — ABNORMAL LOW (ref 6.5–8.1)

## 2019-08-19 LAB — GLUCOSE, CAPILLARY
Glucose-Capillary: 122 mg/dL — ABNORMAL HIGH (ref 70–99)
Glucose-Capillary: 132 mg/dL — ABNORMAL HIGH (ref 70–99)
Glucose-Capillary: 194 mg/dL — ABNORMAL HIGH (ref 70–99)
Glucose-Capillary: 238 mg/dL — ABNORMAL HIGH (ref 70–99)

## 2019-08-19 LAB — CBC WITH DIFFERENTIAL/PLATELET
Abs Immature Granulocytes: 0.01 10*3/uL (ref 0.00–0.07)
Basophils Absolute: 0 10*3/uL (ref 0.0–0.1)
Basophils Relative: 0 %
Eosinophils Absolute: 0.1 10*3/uL (ref 0.0–0.5)
Eosinophils Relative: 2 %
HCT: 32.9 % — ABNORMAL LOW (ref 36.0–46.0)
Hemoglobin: 10.9 g/dL — ABNORMAL LOW (ref 12.0–15.0)
Immature Granulocytes: 0 %
Lymphocytes Relative: 47 %
Lymphs Abs: 3.2 10*3/uL (ref 0.7–4.0)
MCH: 30.4 pg (ref 26.0–34.0)
MCHC: 33.1 g/dL (ref 30.0–36.0)
MCV: 91.9 fL (ref 80.0–100.0)
Monocytes Absolute: 0.7 10*3/uL (ref 0.1–1.0)
Monocytes Relative: 10 %
Neutro Abs: 2.8 10*3/uL (ref 1.7–7.7)
Neutrophils Relative %: 41 %
Platelets: 373 10*3/uL (ref 150–400)
RBC: 3.58 MIL/uL — ABNORMAL LOW (ref 3.87–5.11)
RDW: 14.6 % (ref 11.5–15.5)
WBC: 6.9 10*3/uL (ref 4.0–10.5)
nRBC: 0 % (ref 0.0–0.2)

## 2019-08-19 LAB — MAGNESIUM: Magnesium: 1.4 mg/dL — ABNORMAL LOW (ref 1.7–2.4)

## 2019-08-19 LAB — PHOSPHORUS: Phosphorus: 2.2 mg/dL — ABNORMAL LOW (ref 2.5–4.6)

## 2019-08-19 MED ORDER — MAGNESIUM SULFATE 2 GM/50ML IV SOLN
2.0000 g | Freq: Once | INTRAVENOUS | Status: AC
  Administered 2019-08-19: 17:00:00 2 g via INTRAVENOUS
  Filled 2019-08-19: qty 50

## 2019-08-19 MED ORDER — MEGESTROL ACETATE 400 MG/10ML PO SUSP
400.0000 mg | Freq: Two times a day (BID) | ORAL | Status: DC
  Administered 2019-08-19 – 2019-08-20 (×2): 400 mg via ORAL
  Filled 2019-08-19 (×2): qty 10

## 2019-08-19 MED ORDER — MEGESTROL ACETATE 625 MG/5ML PO SUSP: 625.0000 mg | Freq: Every day | ORAL | Status: DC

## 2019-08-19 MED ORDER — K PHOS MONO-SOD PHOS DI & MONO 155-852-130 MG PO TABS
250.0000 mg | ORAL_TABLET | Freq: Two times a day (BID) | ORAL | Status: DC
  Administered 2019-08-19 – 2019-08-20 (×2): 250 mg via ORAL
  Filled 2019-08-19 (×2): qty 1

## 2019-08-19 MED ORDER — ACETAMINOPHEN 325 MG PO TABS: 325.0000 mg | ORAL_TABLET | Freq: Four times a day (QID) | ORAL | Status: DC | PRN

## 2019-08-19 MED ORDER — LATANOPROST 0.005 % OP SOLN: 1.0000 [drp] | Freq: Every day | OPHTHALMIC | Status: DC

## 2019-08-19 NOTE — Progress Notes (Addendum)
PROGRESS NOTE    Debra [name]   GUY:[id number]  DOB: 1930/05/17  DOA: 08/15/2019 PCP: Asencion Noble, MD   Brief Narrative:  Debra [name] is a 86 y.o.femalewith medical history significant forAmpullary carinoma and s/p CBD stent in 05/2019 s/p radiation,hypertension, type 2 diabetes, TIA and hyperlipidemia who presents with periumbilical abdominal pain, nauseaand vomiting. Unfortunately, per oncology documentation patient not a candidate for surgery or chemotherapy due to advanced age and multiple comorbidities including dementia. Dr.feng is her primary oncologist. Currently she has a home palliative care nurse and family is actively trying to select one HCPOA since patient has dementia. She presented to [place] center where a CT scan was done. CT abd/pelvis with contrast:  Ill-defined low-density mass at the region of the pancreatic head and ampulla measuring up to 3.0 cm compatible with known biopsy-proven ampullary carcinoma. Findings suggest invasion/obstruction of the duodenal lumen at this level. The stomach and proximal duodenum are markedly dilated upstream from the level of the ampullary tumor. Findings compatible with mechanical outlet obstruction.    NG was placed and she was transferred to [facility].   Subjective: Drinking w/o vomiting. No complaints.     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 This was stented with a 6cm long 54mm                            diameter uncovered metal Wallstent in apparent good                            position via fluoroscopy. - advance diet as tolerated- advanced to soft diet today-  d/c home tomorrow if tolerating - cont IVF for now until I see her eating more - resume Megace today - she and her family want aggressive treatment- f/u with Dr Burr Medico for further treatments  Active Problems: Dementia - no behavioral disturbances noted in the hospital   Hypophosphatemia and hypomagnesemia - replacing    DM (diabetes mellitus), type 2 ([place]) - holding insulin until oral intake improves- last A1c 6.1    Time spent in minutes: 35 DVT prophylaxis: Lovenox Code Status: Full code Family Communication: daughter at bedside Disposition Plan:  Status is: Inpatient  Remains inpatient appropriate because:advancing diet to solid food will continuing IV hydration.   Dispo: The patient is from: Home              Anticipated d/c is to: Home              Anticipated d/c date is: 1 day              Patient currently is not medically stable to d/c.  Consultants:   GI  Oncology Procedures:   Duodenal stent Antimicrobials:  Anti-infectives (From admission, onward)   None       Objective: Vitals:   08/18/19 1334 08/18/19 2026 08/19/19 0544 08/19/19 1316  BP: (!) 155/82 (!) 149/67 (!) 154/70 (!) 161/75  Pulse: 88 90 81 71  Resp: 16 20  17   Temp: 98.1 F (36.7 C) 98.2 F (36.8 C)  98.3 F (36.8 C)  TempSrc:  Oral  Oral  SpO2: 100% 99%  100%  Weight:      Height:        Intake/Output Summary (Last 24 hours) at 08/19/2019 1423 Last data filed at 08/19/2019 1400 Gross per 24 hour  Intake 2225 ml  Output 0 ml  Net 2225 ml   Filed Weights   08/15/19 1135  Weight: 63 kg    Examination: General exam: Appears comfortable  HEENT: PERRLA, oral mucosa moist, no sclera icterus or thrush Respiratory system: Clear to auscultation. Respiratory effort normal. Cardiovascular system: S1 & S2 heard, RRR.   Gastrointestinal system: Abdomen soft, non-tender, nondistended. Normal bowel sounds. Central nervous system: Alert and oriented. No focal neurological  deficits. Extremities: No cyanosis, clubbing or edema Skin: No rashes or ulcers Psychiatry:  Mood & affect appropriate.     Data Reviewed: I have personally reviewed following labs and imaging studies  CBC: Recent Labs  Lab 08/15/19 1239 08/16/19 0036 08/17/19 0554 08/18/19 1002 08/19/19 0527  WBC 6.5 6.4 5.7 7.6 6.9  NEUTROABS 3.3  --  2.3 3.9 2.8  HGB 12.1 11.2* 10.2* 10.9* 10.9*  HCT 35.7* 33.3* 30.1* 32.0* 32.9*  MCV 89.5 90.5 91.2 89.4 91.9  PLT 418* 368 363 449* 071   Basic Metabolic Panel: Recent Labs  Lab 08/15/19 1239 08/16/19 0036 08/16/19 0900 08/17/19 0554 08/18/19 1002 08/19/19 0527  NA 131* 135  --  136 134* 135  K 3.3* 3.2*  --  3.8 3.9 3.7  CL 93* 99  --  102 101 104  CO2 23 25  --  24 23 21*  GLUCOSE 150* 99  --  69* 140* 126*  BUN 12 10  --  9 9 5*  CREATININE 0.79 0.69  --  0.78 0.91 0.81  CALCIUM 8.7* 8.5*  --  8.5* 8.6* 8.8*  MG  --   --  1.2* 2.1 1.5* 1.4*  PHOS  --   --   --  3.1 2.7 2.2*   GFR: Estimated Creatinine Clearance: 44 mL/min (by C-G formula based on SCr of 0.81 mg/dL). Liver Function Tests: Recent Labs  Lab 08/15/19 1239 08/17/19 0554 08/18/19 1002 08/19/19 0527  AST 26 18 19 17   ALT 14 13 14 13   ALKPHOS 74 61 66 62  BILITOT 0.5 0.9 0.7 0.7  PROT 6.9 5.8* 6.0* 5.7*  ALBUMIN 2.9* 2.6* 2.8* 2.6*   Recent Labs  Lab 08/15/19 1239  LIPASE 47   No results for input(s): AMMONIA in the last 168 hours. Coagulation Profile: Recent Labs  Lab 08/15/19 1239  INR 1.0   Cardiac Enzymes: No results for input(s): CKTOTAL, CKMB, CKMBINDEX, TROPONINI in the last 168 hours. BNP (last 3 results) No results for input(s): PROBNP in the last 8760 hours. HbA1C: No results for input(s): HGBA1C in the last 72 hours. CBG: Recent Labs  Lab 08/17/19 0955 08/18/19 1131 08/18/19 2315 08/19/19 0535 08/19/19 1124  GLUCAP 116* 181* 126* 122* 132*   Lipid Profile: No results for input(s): CHOL, HDL, LDLCALC, TRIG, CHOLHDL,  LDLDIRECT  in the last 72 hours. Thyroid Function Tests: No results for input(s): TSH, T4TOTAL, FREET4, T3FREE, THYROIDAB in the last 72 hours. Anemia Panel: No results for input(s): VITAMINB12, FOLATE, FERRITIN, TIBC, IRON, RETICCTPCT in the last 72 hours. Urine analysis:    Component Value Date/Time   COLORURINE YELLOW 08/15/2019 1519   APPEARANCEUR CLEAR 08/15/2019 1519   LABSPEC 1.010 08/15/2019 1519   PHURINE 8.5 (H) 08/15/2019 1519   GLUCOSEU NEGATIVE 08/15/2019 1519   HGBUR NEGATIVE 08/15/2019 1519   BILIRUBINUR NEGATIVE 08/15/2019 1519   KETONESUR NEGATIVE 08/15/2019 1519   PROTEINUR NEGATIVE 08/15/2019 1519   NITRITE NEGATIVE 08/15/2019 1519   LEUKOCYTESUR NEGATIVE 08/15/2019 1519   Sepsis Labs: @LABRCNTIP (procalcitonin:4,lacticidven:4) ) Recent Results (from the past 240 hour(s))  SARS Coronavirus 2 by RT PCR (hospital order, performed in [place] hospital lab) Nasopharyngeal Nasopharyngeal Swab     Status: None   Collection Time: 08/15/19  3:46 PM   Specimen: Nasopharyngeal Swab  Result Value Ref Range Status   SARS Coronavirus 2 NEGATIVE NEGATIVE Final    Comment: (NOTE) SARS-CoV-2 target nucleic acids are NOT DETECTED.  The SARS-CoV-2 RNA is generally detectable in upper and lower respiratory specimens during the acute phase of infection. The lowest concentration of SARS-CoV-2 viral copies this assay can detect is 250 copies / mL. A negative result does not preclude SARS-CoV-2 infection and should not be used as the sole basis for treatment or other patient management decisions.  A negative result may occur with improper specimen collection / handling, submission of specimen other than nasopharyngeal swab, presence of viral mutation(s) within the areas targeted by this assay, and inadequate number of viral copies (<250 copies / mL). A negative result must be combined with clinical observations, patient history, and epidemiological information.  Fact Sheet  for Patients:   StrictlyIdeas.no  Fact Sheet for Healthcare Providers: BankingDealers.co.za  This test is not yet approved or  cleared by the [place] FDA and has been authorized for detection and/or diagnosis of SARS-CoV-2 by FDA under an Emergency Use Authorization (EUA).  This EUA will remain in effect (meaning this test can be used) for the duration of the COVID-19 declaration under Section 564(b)(1) of the Act, 21 U.S.C. section 360bbb-3(b)(1), unless the authorization is terminated or revoked sooner.  Performed at [facility], [address] [place], [place] [address]          Radiology Studies: No results found.    Scheduled Meds: . enoxaparin (LOVENOX) injection  40 mg Subcutaneous Q24H  . feeding supplement (GLUCERNA SHAKE)  237 mL Oral TID BM  . latanoprost  1 drop Both Eyes QHS  . pantoprazole (PROTONIX) IV  40 mg Intravenous Q24H  . traZODone  50 mg Oral QHS   Continuous Infusions: . dextrose 5 % lactated ringers with kcl 75 mL/hr at 08/17/19 1530     LOS: 4 days      Debbe Odea, MD [facility] Pager: [email or web address] 08/19/2019, 2:23 PM

## 2019-08-19 NOTE — Progress Notes (Signed)
Debra [name]   DOB:1930-12-16   NA#:[id number]   RKY#:[id number]  Oncology follow up   Subjective: Patient is tolerating liquid diet very well, had a mild abdominal discomfort yesterday, none today.  No nausea or vomiting.  She was resting in the bed, 2 daughters and 2 sons at bedside.  Objective:  Vitals:   08/19/19 0544 08/19/19 1316  BP: (!) 154/70 (!) 161/75  Pulse: 81 71  Resp:  17  Temp:  98.3 F (36.8 C)  SpO2:  100%    Body mass index is 23.13 kg/m.  Intake/Output Summary (Last 24 hours) at 08/19/2019 1444 Last data filed at 08/19/2019 1400 Gross per 24 hour  Intake 2225 ml  Output 0 ml  Net 2225 ml     Sclerae unicteric  Oropharynx clear  No peripheral adenopathy  Lungs clear -- no rales or rhonchi  Heart regular rate and rhythm  Abdomen soft and nontender   MSK no focal spinal tenderness, no peripheral edema  Neuro nonfocal   CBG (last 3)  Recent Labs    08/18/19 2315 08/19/19 0535 08/19/19 1124  GLUCAP 126* 122* 132*     Labs:  Urine Studies No results for input(s): UHGB, CRYS in the last 72 hours.  Invalid input(s): UACOL, UAPR, USPG, UPH, UTP, UGL, UKET, UBIL, UNIT, UROB, ULEU, UEPI, UWBC, URBC, UBAC, CAST, [place], [place]  Basic Metabolic Panel: Recent Labs  Lab 08/15/19 1239 08/15/19 1239 08/16/19 0036 08/16/19 0036 08/16/19 0900 08/17/19 0554 08/17/19 0554 08/18/19 1002 08/19/19 0527  NA 131*  --  135  --   --  136  --  134* 135  K 3.3*   < > 3.2*   < >  --  3.8   < > 3.9 3.7  CL 93*  --  99  --   --  102  --  101 104  CO2 23  --  25  --   --  24  --  23 21*  GLUCOSE 150*  --  99  --   --  69*  --  140* 126*  BUN 12  --  10  --   --  9  --  9 5*  CREATININE 0.79  --  0.69  --   --  0.78  --  0.91 0.81  CALCIUM 8.7*  --  8.5*  --   --  8.5*  --  8.6* 8.8*  MG  --   --   --   --  1.2* 2.1  --  1.5* 1.4*  PHOS  --   --   --   --   --  3.1  --  2.7 2.2*   < > = values in this interval not displayed.   GFR Estimated Creatinine  Clearance: 44 mL/min (by C-G formula based on SCr of 0.81 mg/dL). Liver Function Tests: Recent Labs  Lab 08/15/19 1239 08/17/19 0554 08/18/19 1002 08/19/19 0527  AST 26 18 19 17   ALT 14 13 14 13   ALKPHOS 74 61 66 62  BILITOT 0.5 0.9 0.7 0.7  PROT 6.9 5.8* 6.0* 5.7*  ALBUMIN 2.9* 2.6* 2.8* 2.6*   Recent Labs  Lab 08/15/19 1239  LIPASE 47   No results for input(s): AMMONIA in the last 168 hours. Coagulation profile Recent Labs  Lab 08/15/19 1239  INR 1.0    CBC: Recent Labs  Lab 08/15/19 1239 08/16/19 0036 08/17/19 0554 08/18/19 1002 08/19/19 0527  WBC 6.5 6.4 5.7 7.6 6.9  NEUTROABS 3.3  --  2.3 3.9 2.8  HGB 12.1 11.2* 10.2* 10.9* 10.9*  HCT 35.7* 33.3* 30.1* 32.0* 32.9*  MCV 89.5 90.5 91.2 89.4 91.9  PLT 418* 368 363 449* 373   Cardiac Enzymes: No results for input(s): CKTOTAL, CKMB, CKMBINDEX, TROPONINI in the last 168 hours. BNP: Invalid input(s): POCBNP CBG: Recent Labs  Lab 08/17/19 0955 08/18/19 1131 08/18/19 2315 08/19/19 0535 08/19/19 1124  GLUCAP 116* 181* 126* 122* 132*   D-Dimer No results for input(s): DDIMER in the last 72 hours. Hgb A1c No results for input(s): HGBA1C in the last 72 hours. Lipid Profile No results for input(s): CHOL, HDL, LDLCALC, TRIG, CHOLHDL, LDLDIRECT in the last 72 hours. Thyroid function studies No results for input(s): TSH, T4TOTAL, T3FREE, THYROIDAB in the last 72 hours.  Invalid input(s): FREET3 Anemia work up No results for input(s): VITAMINB12, FOLATE, FERRITIN, TIBC, IRON, RETICCTPCT in the last 72 hours. Microbiology Recent Results (from the past 240 hour(s))  SARS Coronavirus 2 by RT PCR (hospital order, performed in [facility] hospital lab) Nasopharyngeal Nasopharyngeal Swab     Status: None   Collection Time: 08/15/19  3:46 PM   Specimen: Nasopharyngeal Swab  Result Value Ref Range Status   SARS Coronavirus 2 NEGATIVE NEGATIVE Final    Comment: (NOTE) SARS-CoV-2 target nucleic acids are NOT  DETECTED.  The SARS-CoV-2 RNA is generally detectable in upper and lower respiratory specimens during the acute phase of infection. The lowest concentration of SARS-CoV-2 viral copies this assay can detect is 250 copies / mL. A negative result does not preclude SARS-CoV-2 infection and should not be used as the sole basis for treatment or other patient management decisions.  A negative result may occur with improper specimen collection / handling, submission of specimen other than nasopharyngeal swab, presence of viral mutation(s) within the areas targeted by this assay, and inadequate number of viral copies (<250 copies / mL). A negative result must be combined with clinical observations, patient history, and epidemiological information.  Fact Sheet for Patients:   StrictlyIdeas.no  Fact Sheet for Healthcare Providers: BankingDealers.co.za  This test is not yet approved or  cleared by the [place] FDA and has been authorized for detection and/or diagnosis of SARS-CoV-2 by FDA under an Emergency Use Authorization (EUA).  This EUA will remain in effect (meaning this test can be used) for the duration of the COVID-19 declaration under Section 564(b)(1) of the Act, 21 U.S.C. section 360bbb-3(b)(1), unless the authorization is terminated or revoked sooner.  Performed at [facility], [address] [place], [place] [address]       Studies:  No results found.  Assessment: 86 y.o. female   1.  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  Ampullary carcinoma, status post palliative radiation, on supportive care now 3.  Dementia 4.  Hypertension and type 2 diabetes  Plan:  -she is clinically doing well after stent placement, tolerating liquid diet, dietitian spoke with patient and her family about low residual diet today. -We discussed that bowel obstruction  is likely going to happen again, rolled due to the tumor growth into the stent.  Due to her advanced age and dementia, she is not a candidate for surgery. We discussed feeding tube which I do not recommend either. Her sons state that their mother wants to "liver as long as she can". We discussed the goal of care is predicated, and discussed the importance of quality of  life, as his quality of life.  I recommend hospice, and I discussed the hospice service in detail with her family.  After lengthy discussion, the family would like to resume palliative care at home after discharge, and think about hospice.  I will see her as needed, I will likely offer virtual visit, her family knows to call me if they have any concerns.   OK to discharge form my standpoint.   Truitt Merle, MD 08/19/2019  2:44 PM

## 2019-08-20 DIAGNOSIS — Z4659 Encounter for fitting and adjustment of other gastrointestinal appliance and device: Secondary | ICD-10-CM

## 2019-08-20 DIAGNOSIS — C241 Malignant neoplasm of ampulla of Vater: Secondary | ICD-10-CM

## 2019-08-20 LAB — BASIC METABOLIC PANEL
Anion gap: 9 (ref 5–15)
BUN: <5 mg/dL — ABNORMAL LOW (ref 8–23)
CO2: 22 mmol/L (ref 22–32)
Calcium: 8.4 mg/dL — ABNORMAL LOW (ref 8.9–10.3)
Chloride: 101 mmol/L (ref 98–111)
Creatinine, Ser: 0.82 mg/dL (ref 0.44–1.00)
GFR calc Af Amer: >60 mL/min (ref >60)
GFR calc non Af Amer: >60 mL/min (ref >60)
Glucose, Bld: 114 mg/dL — ABNORMAL HIGH (ref 70–99)
Potassium: 3.8 mmol/L (ref 3.5–5.1)
Sodium: 132 mmol/L — ABNORMAL LOW (ref 135–145)

## 2019-08-20 LAB — PHOSPHORUS: Phosphorus: 2.6 mg/dL (ref 2.5–4.6)

## 2019-08-20 LAB — MAGNESIUM: Magnesium: 1.6 mg/dL — ABNORMAL LOW (ref 1.7–2.4)

## 2019-08-20 LAB — GLUCOSE, CAPILLARY: Glucose-Capillary: 101 mg/dL — ABNORMAL HIGH (ref 70–99)

## 2019-08-20 MED ORDER — GLUCERNA SHAKE PO LIQD: 237.0000 mL | Freq: Three times a day (TID) | ORAL | 0 refills | Status: AC

## 2019-08-20 NOTE — Progress Notes (Signed)
D/C instructions given to patient and family. Patient or family had no questions. NT or writer will wheel patient out once family is ready to pick her up

## 2019-08-21 LAB — GLUCOSE, CAPILLARY: Glucose-Capillary: 68 mg/dL — ABNORMAL LOW (ref 70–99)

## 2019-08-21 NOTE — Progress Notes (Signed)
  Radiation Oncology         [phone] ________________________________  Name: Debra [name] MRN: [id number]  Date: 06/26/2019  DOB: 03-13-1930  End of Treatment Note  Diagnosis:   Ampullary cancer     Indication for treatment::  curative       Radiation treatment dates:   06/17/19 - 06/26/19  Site/dose:   the abdomen was treated to a dose of 33 Gy in 5 fractions, corresponding to a course of SBRT.  Narrative: The patient tolerated radiation treatment relatively well.   No major issues of nausea, diarrhea.  Plan: The patient has completed radiation treatment. The patient will return to radiation oncology clinic for routine followup in one month. I advised the patient to call or return sooner if they have any questions or concerns related to their recovery or treatment. ________________________________  Jodelle Gross, M.D., Ph.D.

## 2019-08-24 LAB — GLUCOSE, CAPILLARY: Glucose-Capillary: 134 mg/dL — ABNORMAL HIGH (ref 70–99)

## 2019-08-24 NOTE — Discharge Summary (Signed)
Physician Discharge Summary  Debra [name] YJE:[id number] DOB: 1930-07-03 DOA: 08/15/2019  PCP: Asencion Noble, MD  Admit date: 08/15/2019 Discharge date: 08/24/2019  Admitted From: home Disposition:  home   Recommendations for Outpatient Follow-up:  1. F/u on nutrition  Home Health:  ordered  Discharge Condition:  stable   CODE STATUS:  Full code   Diet recommendation:  Soft diet Consultations:  GI  [place]   Procedures/Studies: . EGD    Discharge Diagnoses:  Principal Problem:   Duodenal obstruction Active Problems:   HLD (hyperlipidemia)   HTN (hypertension)   DM (diabetes mellitus), type 2 (HCC)   Pain in the abdomen   High serum lactate   Thrombocytosis (HCC)   Malignant neoplasm of head of pancreas ([place])     Brief Narrative:  Debra [name] is a 86 y.o.femalewith medical history significant forAmpullary carinoma and s/p CBD stent in 05/2019 s/p radiation,hypertension, type 2 diabetes, TIA and hyperlipidemia who presents with periumbilical abdominal pain, nauseaand vomiting. Unfortunately, per oncology documentation patient not a candidate for surgery or chemotherapy due to advanced age and multiple comorbidities including dementia. Dr.feng is her primary oncologist.Currently she has a home palliative care nurse and family is actively trying to select one HCPOA since patient has dementia. She presented to [facility] where a CT scan was done. CT abd/pelvis with contrast:  Ill-defined low-density mass at the region of the pancreatic head and ampulla measuring up to 3.0 cm compatible with known biopsy-proven ampullary carcinoma. Findings suggest invasion/obstruction of the duodenal lumen at this level. The stomach and proximal duodenum are markedly dilated upstream from the level of the ampullary tumor. Findings compatible with mechanical outlet obstruction.    NG was placed and she was transferred to [facility].   Subjective: Drinking and  eating w/o vomiting. No complaints.     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 This was stented with a 6cm long 19m  diameter uncovered metal Wallstent in apparent good  position via fluoroscopy. - advanced to soft diet today- - she has been evaluated by Dr FBurr Medicoin the hospital  - she and her family want aggressive treatment- f/u with Dr FBurr Medicofor further treatments  Active Problems: Dementia - no behavioral disturbances noted in the hospital   Hypophosphatemia and hypomagnesemia - replaced    DM [place]diabetes mellitus), type 2 [place] - holding insulin until oral intake improves- last A1c 6.1       Discharge Exam: Vitals:   08/19/19 1316 08/19/19 2111  BP: (!) 161/75 (!) 161/83  Pulse: 71 88  Resp: 17 16  Temp: 98.3 F (36.8 C) 98.4 F (36.9 C)  SpO2: 100% 100%   Vitals:   08/18/19 2026 08/19/19 0544 08/19/19 1316 08/19/19 2111  BP: (!) 149/67 (!) 154/70 (!) 161/75 (!) 161/83  Pulse: 90 81 71 88  Resp: _0 Temp: 98.2 F (36.8 C)  98.3 F (36.8 C) 98.4 F (36.9 C)  TempSrc: Oral  Oral Oral  SpO2: 99%  100% 100%  Weight:      Height:        General: Pt is alert, awake, not in acute distress Cardiovascular: RRR, S1/S2 +, no rubs, no gallops  Respiratory: CTA bilaterally, no wheezing, no rhonchi Abdominal: Soft, NT, ND, bowel sounds + Extremities: no edema, no cyanosis   Discharge Instructions  Discharge Instructions    Diet - low sodium heart healthy   Complete by: As directed    Low fiber diet   Increase activity slowly   Complete by: As directed       Allergies as of 08/20/2019      Reactions   Other Nausea And Vomiting   Diet Sodas. Sugar-free jello      Medication List    STOP taking these medications   Accu-Chek Combo Kit   mirtazapine 7.5 MG tablet Commonly known as: REMERON     TAKE these medications   Accu-Chek Guide test strip Generic drug: glucose blood 2 (two) times daily. use for testing   Accu-Chek Guide w/Device Kit USE AS DIRECTED TO test TWICE DAILY   Accu-Chek Softclix Lancets lancets USE TWICE DAILY with strips   acetaminophen 325 MG tablet Commonly known as: TYLENOL Take 325 mg by mouth every 6 (six) hours as needed for mild pain, moderate pain, fever or headache.   feeding supplement (GLUCERNA SHAKE) Liqd Take 237 mLs by mouth 3 (three) times daily between meals.   latanoprost 0.005 % ophthalmic solution Commonly known as: XALATAN Place 1 drop into both eyes at bedtime.   megestrol 625 MG/5ML suspension Commonly known as: MEGACE ES Take 5 mLs (625 mg total) by mouth daily.   metFORMIN 500 MG 24 hr tablet Commonly known as: GLUCOPHAGE-XR Take 500 mg by mouth daily with breakfast.   omeprazole 20 MG capsule Commonly known as: PRILOSEC Take 1 capsule (20 mg total) by mouth daily.   potassium chloride 10 MEQ tablet Commonly known as: KLOR-CON Take 1 tablet (10 mEq total) by mouth daily.   senna 8.6 MG Tabs tablet Commonly known as: SENOKOT Take 1 tablet (8.6 mg total) by mouth at bedtime as needed for mild constipation.   triamcinolone 0.025 % cream Commonly known as: KENALOG Apply 1 application topically 2 (two) times daily as needed (Rash).       Allergies  Allergen Reactions  . Other Nausea And Vomiting    Diet Sodas. Sugar-free jello      CT Abdomen Pelvis W Contrast  Result Date: 08/15/2019 CLINICAL DATA:  Abdominal pain. History of recently diagnosed ampullary carcinoma status post biliary stent placement EXAM: CT ABDOMEN AND PELVIS WITH CONTRAST TECHNIQUE:  Multidetector CT imaging of the abdomen and pelvis was performed using the standard protocol following bolus administration of intravenous contrast. CONTRAST:  170m OMNIPAQUE IOHEXOL 300 MG/ML  SOLN COMPARISON:  CT 05/18/2019 FINDINGS: Lower chest: Visualized lung bases are clear. Heart size within normal limits. Coronary artery calcification. Hepatobiliary: Interval placement of biliary stent within the common bile duct. Intrahepatic biliary dilatation with postprocedural pneumobilia. No intrahepatic lesion. Layering stones within the gallbladder lumen. Small amount of air is also noted within the gallbladder. Pancreas: Marked pancreatic ductal dilatation. Ill-defined low-density mass at the region of the pancreatic head and ampulla measuring approximately 3.0 x 2.0 cm (series 2, image 34) compatible with known biopsy-proven ampullary carcinoma. No peripancreatic inflammatory changes or fluid collection. Spleen: Normal in size without focal abnormality. Adrenals/Urinary Tract: Unremarkable adrenal glands. Kidneys enhance symmetrically without focal lesion, stone, or hydronephrosis. Ureters are nondilated. Urinary bladder appears unremarkable. Stomach/Bowel: Stomach is massively distended and fluid-filled. The duodenal bulb and descending duodenum are distended to the level of the ampulla. Suspected invasion and obstruction of the duodenum at the  level of the ampulla. The duodenum distal to this level is collapsed. There is a small amount of fluid within the more distal small bowel. Mild volume of stool within the colon. No pericolonic inflammatory changes. A normal appendix is visualized. Vascular/Lymphatic: Scattered aortoiliac atherosclerotic calcifications without aneurysm. No abdominopelvic lymphadenopathy. Reproductive: Uterus and bilateral adnexa are unremarkable. Other: No ascites. No abdominopelvic fluid collection. No pneumoperitoneum. Musculoskeletal: No new or acute osseous findings. IMPRESSION: 1.  Ill-defined low-density mass at the region of the pancreatic head and ampulla measuring up to 3.0 cm compatible with known biopsy-proven ampullary carcinoma. Findings suggest invasion/obstruction of the duodenal lumen at this level. The stomach and proximal duodenum are markedly dilated upstream from the level of the ampullary tumor. Findings compatible with mechanical outlet obstruction. 2. Interval placement of biliary stent within the common bile duct. Intrahepatic biliary dilatation with postprocedural pneumobilia. 3. Cholelithiasis. 4. Aortic atherosclerosis. (ICD10-I70.0). These results were called by telephone at the time of interpretation on 08/15/2019 at 2:52 pm to provider [facility] , who verbally acknowledged these results. Electronically Signed   By: Davina Poke D.O.   On: 08/15/2019 14:53   DG Abd Portable 1V  Result Date: 08/15/2019 CLINICAL DATA:  NG tube placement EXAM: PORTABLE ABDOMEN - 1 VIEW COMPARISON:  CT abdomen pelvis 08/15/2019 FINDINGS: Interval placement of a transesophageal tube which terminates in left mid abdomen possibly within the distended gastric body with the side port distal to the GE junction. Additional telemetry leads overlie the chest. Right upper quadrant biliary stent noted as well. Atelectatic changes in the otherwise clear lung bases. Cardiomediastinal contours are unremarkable. No evidence of subdiaphragmatic free air. No suspicious calcifications the imaged portions of the abdomen. Excreted contrast media in the renal collecting system. Degenerative changes throughout the imaged spine. No worrisome osseous lesions. IMPRESSION: Interval placement of a transesophageal tube which terminates in left mid abdomen possibly within the distended gastric body with the side port distal to the GE junction. Electronically Signed   By: Lovena Le M.D.   On: 08/15/2019 16:22   EEG adult        [facility] [place]. [place] [id number] [phone]      Electroencephalogram Procedure Note Ms. Jacquiline Doe Date of Birth:  06/01/1930 Medical Record Number:  [id number] Indications: Diagnostic Date of Procedure : 08/05/2019 Medications: none Clinical history : 86 year old patient being evaluated for seizures Technical Description This study was performed using 17 channel digital electroencephalographic recording equipment. International 10-20 electrode placement was used. The record was obtained with the patient awake, drowsy and asleep.  The record is of fair technical quality for purposes of interpretation. Activation Procedures:  photic stimulation . EEG Description Awake: Alpha Activity: The waking state record contains a well-defined bi-occipital alpha rhythm of  moderate amplitude with a dominant frequency of 6-7 Hz. Reactivity is absent.  There is intermittent generalized 4 to 5 Hz slowing seen in a diffuse and continuous distribution. No paroxsymal activity, spikes, or sharp waves are noted. Technical component of study is adequate. EKG tracing shows regular sinus rhythm Length of this recording is 21 minutes and 12 seconds Sleep: With drowsiness, there is attenuation of the background alpha activity. As the patient enters into light sleep, vertex waves and symmetrical spindles are noted. K complexes are noted in sleep. Transition to the waking state is unremarkable. Result of Activation Procedures: Hyperventilation: N/A. Photo Stimulation: No photic driving response is noted. Summary This is a abnormal EEG due to  the presence of moderate generalized slowing indicative of bihemispheric dysfunction which is nonspecific and may be seen in a variety of toxic metabolic, hypoxic, degenerative or ischemic etiologies.  No definite epileptiform activity was noted.   DG C-Arm 1-60 Min  Result Date: 08/17/2019 CLINICAL DATA:  Intraoperative imaging for duodenal stent placement. EXAM: DG C-ARM 1-60 MIN COMPARISON:  CT abdomen and pelvis 08/15/2019.  FINDINGS: We are provided with a series of fluoroscopic intraoperative spot views of the upper abdomen. Images demonstrate placement of a duodenal stent. Previously seen common bile duct stent again noted. No acute abnormality is identified. IMPRESSION: Intraoperative imaging for duodenal stent placement. Please see operative report for details of the procedure. Electronically Signed   By: Inge Rise M.D.   On: 08/17/2019 12:18      The results of significant diagnostics from this hospitalization (including imaging, microbiology, ancillary and laboratory) are listed below for reference.     Microbiology: Recent Results (from the past 240 hour(s))  SARS Coronavirus 2 by RT PCR (hospital order, performed in [place] hospital lab) Nasopharyngeal Nasopharyngeal Swab     Status: None   Collection Time: 08/15/19  3:46 PM   Specimen: Nasopharyngeal Swab  Result Value Ref Range Status   SARS Coronavirus 2 NEGATIVE NEGATIVE Final    Comment: (NOTE) SARS-CoV-2 target nucleic acids are NOT DETECTED.  The SARS-CoV-2 RNA is generally detectable in upper and lower respiratory specimens during the acute phase of infection. The lowest concentration of SARS-CoV-2 viral copies this assay can detect is 250 copies / mL. A negative result does not preclude SARS-CoV-2 infection and should not be used as the sole basis for treatment or other patient management decisions.  A negative result may occur with improper specimen collection / handling, submission of specimen other than nasopharyngeal swab, presence of viral mutation(s) within the areas targeted by this assay, and inadequate number of viral copies (<250 copies / mL). A negative result must be combined with clinical observations, patient history, and epidemiological information.  Fact Sheet for Patients:   StrictlyIdeas.no  Fact Sheet for Healthcare Providers: BankingDealers.co.za  This test  is not yet approved or  cleared by the [place] FDA and has been authorized for detection and/or diagnosis of SARS-CoV-2 by FDA under an Emergency Use Authorization (EUA).  This EUA will remain in effect (meaning this test can be used) for the duration of the COVID-19 declaration under Section 564(b)(1) of the Act, 21 U.S.C. section 360bbb-3(b)(1), unless the authorization is terminated or revoked sooner.  Performed at [facility], [address] [place], [place] [address]      Labs: BNP (last 3 results) No results for input(s): BNP in the last 8760 hours. Basic Metabolic Panel: Recent Labs  Lab 08/18/19 1002 08/19/19 0527 08/20/19 0457  NA 134* 135 132*  K 3.9 3.7 3.8  CL 101 104 101  CO2 23 21* 22  GLUCOSE 140* 126* 114*  BUN 9 5* <5*  CREATININE 0.91 0.81 0.82  CALCIUM 8.6* 8.8* 8.4*  MG 1.5* 1.4* 1.6*  PHOS 2.7 2.2* 2.6   Liver Function Tests: Recent Labs  Lab 08/18/19 1002 08/19/19 0527  AST 19 17  ALT 14 13  ALKPHOS 66 62  BILITOT 0.7 0.7  PROT 6.0* 5.7*  ALBUMIN 2.8* 2.6*   No results for input(s): LIPASE, AMYLASE in the last 168 hours. No results for input(s): AMMONIA in the last 168 hours. CBC: Recent Labs  Lab 08/18/19 1002 08/19/19 0527  WBC 7.6 6.9  NEUTROABS 3.9 2.8  HGB 10.9* 10.9*  HCT 32.0* 32.9*  MCV 89.4 91.9  PLT 449* 373   Cardiac Enzymes: No results for input(s): CKTOTAL, CKMB, CKMBINDEX, TROPONINI in the last 168 hours. BNP: Invalid input(s): POCBNP CBG: Recent Labs  Lab 08/19/19 0535 08/19/19 1124 08/19/19 1730 08/19/19 2345 08/20/19 0618  GLUCAP 122* 132* 194* 238* 101*   D-Dimer No results for input(s): DDIMER in the last 72 hours. Hgb A1c No results for input(s): HGBA1C in the last 72 hours. Lipid Profile No results for input(s): CHOL, HDL, LDLCALC, TRIG, CHOLHDL, LDLDIRECT in the last 72 hours. Thyroid function studies No results for input(s): TSH, T4TOTAL, T3FREE, THYROIDAB in the last 72  hours.  Invalid input(s): FREET3 Anemia work up No results for input(s): VITAMINB12, FOLATE, FERRITIN, TIBC, IRON, RETICCTPCT in the last 72 hours. Urinalysis    Component Value Date/Time   COLORURINE YELLOW 08/15/2019 1519   APPEARANCEUR CLEAR 08/15/2019 1519   LABSPEC 1.010 08/15/2019 1519   PHURINE 8.5 (H) 08/15/2019 1519   GLUCOSEU NEGATIVE 08/15/2019 1519   HGBUR NEGATIVE 08/15/2019 1519   BILIRUBINUR NEGATIVE 08/15/2019 1519   KETONESUR NEGATIVE 08/15/2019 1519   PROTEINUR NEGATIVE 08/15/2019 1519   NITRITE NEGATIVE 08/15/2019 1519   LEUKOCYTESUR NEGATIVE 08/15/2019 1519   Sepsis Labs Invalid input(s): PROCALCITONIN,  WBC,  LACTICIDVEN Microbiology Recent Results (from the past 240 hour(s))  SARS Coronavirus 2 by RT PCR (hospital order, performed in [place] hospital lab) Nasopharyngeal Nasopharyngeal Swab     Status: None   Collection Time: 08/15/19  3:46 PM   Specimen: Nasopharyngeal Swab  Result Value Ref Range Status   SARS Coronavirus 2 NEGATIVE NEGATIVE Final    Comment: (NOTE) SARS-CoV-2 target nucleic acids are NOT DETECTED.  The SARS-CoV-2 RNA is generally detectable in upper and lower respiratory specimens during the acute phase of infection. The lowest concentration of SARS-CoV-2 viral copies this assay can detect is 250 copies / mL. A negative result does not preclude SARS-CoV-2 infection and should not be used as the sole basis for treatment or other patient management decisions.  A negative result may occur with improper specimen collection / handling, submission of specimen other than nasopharyngeal swab, presence of viral mutation(s) within the areas targeted by this assay, and inadequate number of viral copies (<250 copies / mL). A negative result must be combined with clinical observations, patient history, and epidemiological information.  Fact Sheet for Patients:   StrictlyIdeas.no  Fact Sheet for Healthcare  Providers: BankingDealers.co.za  This test is not yet approved or  cleared by the [place] FDA and has been authorized for detection and/or diagnosis of SARS-CoV-2 by FDA under an Emergency Use Authorization (EUA).  This EUA will remain in effect (meaning this test can be used) for the duration of the COVID-19 declaration under Section 564(b)(1) of the Act, 21 U.S.C. section 360bbb-3(b)(1), unless the authorization is terminated or revoked sooner.  Performed at [facility], [address] [place], [place] [address]      Time coordinating discharge in minutes: 65  SIGNED:   Debbe Odea, MD  [facility] 08/24/2019, 2:33 PM

## 2019-08-25 ENCOUNTER — Encounter: Payer: Self-pay | Admitting: Neurology

## 2019-08-26 DIAGNOSIS — E440 Moderate protein-calorie malnutrition: Secondary | ICD-10-CM | POA: Diagnosis not present

## 2019-08-26 DIAGNOSIS — C241 Malignant neoplasm of ampulla of Vater: Secondary | ICD-10-CM | POA: Diagnosis not present

## 2019-08-26 DIAGNOSIS — Z515 Encounter for palliative care: Secondary | ICD-10-CM | POA: Diagnosis not present

## 2019-09-11 DIAGNOSIS — Z515 Encounter for palliative care: Secondary | ICD-10-CM | POA: Diagnosis not present

## 2019-09-11 DIAGNOSIS — C241 Malignant neoplasm of ampulla of Vater: Secondary | ICD-10-CM | POA: Diagnosis not present

## 2019-09-11 DIAGNOSIS — E43 Unspecified severe protein-calorie malnutrition: Secondary | ICD-10-CM | POA: Diagnosis not present

## 2019-09-24 ENCOUNTER — Emergency Department (HOSPITAL_BASED_OUTPATIENT_CLINIC_OR_DEPARTMENT_OTHER): Payer: Medicare Other

## 2019-09-24 ENCOUNTER — Emergency Department (HOSPITAL_BASED_OUTPATIENT_CLINIC_OR_DEPARTMENT_OTHER)
Admission: EM | Admit: 2019-09-24 | Discharge: 2019-09-24 | Disposition: A | Discharge status: Home / Self Care | Payer: Medicare Other | Attending: Emergency Medicine | Admitting: Emergency Medicine

## 2019-09-24 ENCOUNTER — Encounter (HOSPITAL_BASED_OUTPATIENT_CLINIC_OR_DEPARTMENT_OTHER): Payer: Self-pay

## 2019-09-24 ENCOUNTER — Other Ambulatory Visit: Payer: Self-pay

## 2019-09-24 DIAGNOSIS — C241 Malignant neoplasm of ampulla of Vater: Secondary | ICD-10-CM | POA: Diagnosis not present

## 2019-09-24 DIAGNOSIS — Z79899 Other long term (current) drug therapy: Secondary | ICD-10-CM | POA: Diagnosis not present

## 2019-09-24 DIAGNOSIS — I82413 Acute embolism and thrombosis of femoral vein, bilateral: Secondary | ICD-10-CM | POA: Diagnosis not present

## 2019-09-24 DIAGNOSIS — F0390 Unspecified dementia without behavioral disturbance: Secondary | ICD-10-CM | POA: Insufficient documentation

## 2019-09-24 DIAGNOSIS — R519 Headache, unspecified: Secondary | ICD-10-CM | POA: Insufficient documentation

## 2019-09-24 DIAGNOSIS — K8689 Other specified diseases of pancreas: Secondary | ICD-10-CM | POA: Diagnosis not present

## 2019-09-24 DIAGNOSIS — Z7984 Long term (current) use of oral hypoglycemic drugs: Secondary | ICD-10-CM | POA: Diagnosis not present

## 2019-09-24 DIAGNOSIS — K7689 Other specified diseases of liver: Secondary | ICD-10-CM | POA: Diagnosis not present

## 2019-09-24 DIAGNOSIS — R1084 Generalized abdominal pain: Secondary | ICD-10-CM | POA: Diagnosis not present

## 2019-09-24 DIAGNOSIS — N189 Chronic kidney disease, unspecified: Secondary | ICD-10-CM | POA: Insufficient documentation

## 2019-09-24 DIAGNOSIS — C250 Malignant neoplasm of head of pancreas: Secondary | ICD-10-CM | POA: Diagnosis not present

## 2019-09-24 DIAGNOSIS — Z20822 Contact with and (suspected) exposure to covid-19: Secondary | ICD-10-CM | POA: Diagnosis not present

## 2019-09-24 DIAGNOSIS — I824Y1 Acute embolism and thrombosis of unspecified deep veins of right proximal lower extremity: Secondary | ICD-10-CM | POA: Diagnosis not present

## 2019-09-24 DIAGNOSIS — Z96652 Presence of left artificial knee joint: Secondary | ICD-10-CM | POA: Diagnosis not present

## 2019-09-24 DIAGNOSIS — Z515 Encounter for palliative care: Secondary | ICD-10-CM | POA: Diagnosis not present

## 2019-09-24 DIAGNOSIS — R109 Unspecified abdominal pain: Secondary | ICD-10-CM | POA: Diagnosis present

## 2019-09-24 DIAGNOSIS — I129 Hypertensive chronic kidney disease with stage 1 through stage 4 chronic kidney disease, or unspecified chronic kidney disease: Secondary | ICD-10-CM | POA: Insufficient documentation

## 2019-09-24 DIAGNOSIS — I824Y2 Acute embolism and thrombosis of unspecified deep veins of left proximal lower extremity: Secondary | ICD-10-CM | POA: Diagnosis not present

## 2019-09-24 DIAGNOSIS — E1122 Type 2 diabetes mellitus with diabetic chronic kidney disease: Secondary | ICD-10-CM | POA: Diagnosis not present

## 2019-09-24 DIAGNOSIS — K8020 Calculus of gallbladder without cholecystitis without obstruction: Secondary | ICD-10-CM | POA: Diagnosis not present

## 2019-09-24 DIAGNOSIS — I824Y3 Acute embolism and thrombosis of unspecified deep veins of proximal lower extremity, bilateral: Secondary | ICD-10-CM

## 2019-09-24 DIAGNOSIS — E43 Unspecified severe protein-calorie malnutrition: Secondary | ICD-10-CM | POA: Diagnosis not present

## 2019-09-24 LAB — CBC WITH DIFFERENTIAL/PLATELET
Abs Immature Granulocytes: 0.04 10*3/uL (ref 0.00–0.07)
Basophils Absolute: 0.1 10*3/uL (ref 0.0–0.1)
Basophils Relative: 1 %
Eosinophils Absolute: 0.3 10*3/uL (ref 0.0–0.5)
Eosinophils Relative: 3 %
HCT: 35.7 % — ABNORMAL LOW (ref 36.0–46.0)
Hemoglobin: 12.1 g/dL (ref 12.0–15.0)
Immature Granulocytes: 0 %
Lymphocytes Relative: 43 %
Lymphs Abs: 4.5 10*3/uL — ABNORMAL HIGH (ref 0.7–4.0)
MCH: 30.7 pg (ref 26.0–34.0)
MCHC: 33.9 g/dL (ref 30.0–36.0)
MCV: 90.6 fL (ref 80.0–100.0)
Monocytes Absolute: 0.8 10*3/uL (ref 0.1–1.0)
Monocytes Relative: 8 %
Neutro Abs: 4.9 10*3/uL (ref 1.7–7.7)
Neutrophils Relative %: 45 %
Platelets: 545 10*3/uL — ABNORMAL HIGH (ref 150–400)
RBC: 3.94 MIL/uL (ref 3.87–5.11)
RDW: 15.2 % (ref 11.5–15.5)
WBC: 10.6 10*3/uL — ABNORMAL HIGH (ref 4.0–10.5)
nRBC: 0 % (ref 0.0–0.2)

## 2019-09-24 LAB — URINALYSIS, ROUTINE W REFLEX MICROSCOPIC
Bilirubin Urine: NEGATIVE
Glucose, UA: NEGATIVE mg/dL
Hgb urine dipstick: NEGATIVE
Ketones, ur: NEGATIVE mg/dL
Leukocytes,Ua: NEGATIVE
Nitrite: NEGATIVE
Protein, ur: NEGATIVE mg/dL
Specific Gravity, Urine: <1.005 — ABNORMAL LOW (ref 1.005–1.030)
pH: 6.5 (ref 5.0–8.0)

## 2019-09-24 LAB — COMPREHENSIVE METABOLIC PANEL
ALT: 9 U/L (ref 0–44)
AST: 15 U/L (ref 15–41)
Albumin: 3.4 g/dL — ABNORMAL LOW (ref 3.5–5.0)
Alkaline Phosphatase: 48 U/L (ref 38–126)
Anion gap: 12 (ref 5–15)
BUN: 28 mg/dL — ABNORMAL HIGH (ref 8–23)
CO2: 19 mmol/L — ABNORMAL LOW (ref 22–32)
Calcium: 9.4 mg/dL (ref 8.9–10.3)
Chloride: 99 mmol/L (ref 98–111)
Creatinine, Ser: 1.09 mg/dL — ABNORMAL HIGH (ref 0.44–1.00)
GFR calc Af Amer: 53 mL/min — ABNORMAL LOW (ref >60)
GFR calc non Af Amer: 46 mL/min — ABNORMAL LOW (ref >60)
Glucose, Bld: 108 mg/dL — ABNORMAL HIGH (ref 70–99)
Potassium: 4.6 mmol/L (ref 3.5–5.1)
Sodium: 130 mmol/L — ABNORMAL LOW (ref 135–145)
Total Bilirubin: 0.4 mg/dL (ref 0.3–1.2)
Total Protein: 7.3 g/dL (ref 6.5–8.1)

## 2019-09-24 LAB — LIPASE, BLOOD: Lipase: 24 U/L (ref 11–51)

## 2019-09-24 LAB — SARS CORONAVIRUS 2 BY RT PCR (HOSPITAL ORDER, PERFORMED IN ~~LOC~~ HOSPITAL LAB): SARS Coronavirus 2: NEGATIVE

## 2019-09-24 IMAGING — CT CT HEAD W/O CM
3 series · 14 of 45 positions shown, 16 images · non-contrast
Comparison: None.

CLINICAL DATA: Per daughter pt c/o HA, abd pain x today-denies
v/d-pt with dementia-NAD-[place] triage in [place] or worsening
(Age >= 50y)

EXAM:
CT HEAD WITHOUT CONTRAST
TECHNIQUE: Contiguous axial images were obtained from the base of the skull
through the vertex without intravenous contrast.

[Series 2: head wo · axial · 0.41mm/px · z∈[-104,+10]mm · 8 of 28 slices shown, 10 images]
[im 3/28  brain]
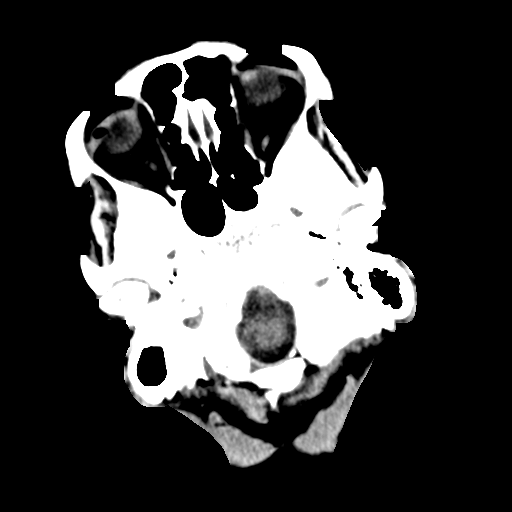
[im 3/28  bone]
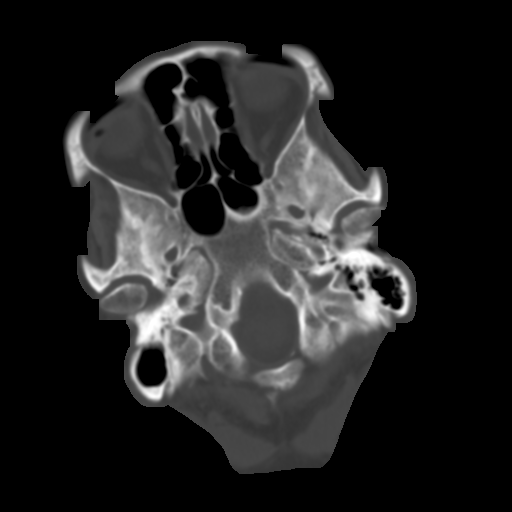
[im 6/28  brain]
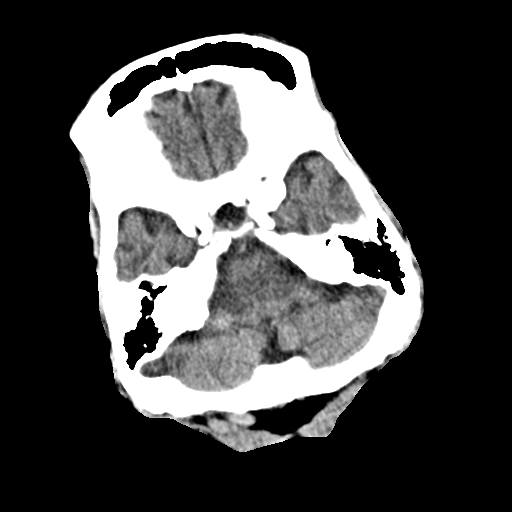
[im 10/28  brain]
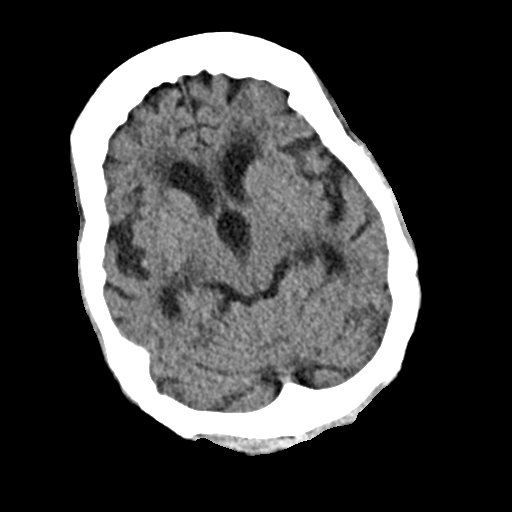
[im 13/28  brain]
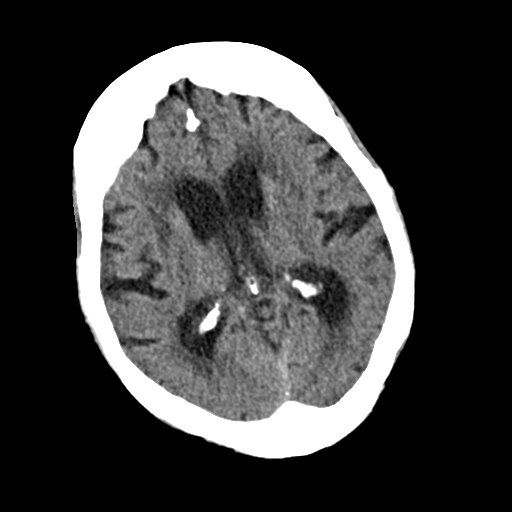
[im 16/28  brain]
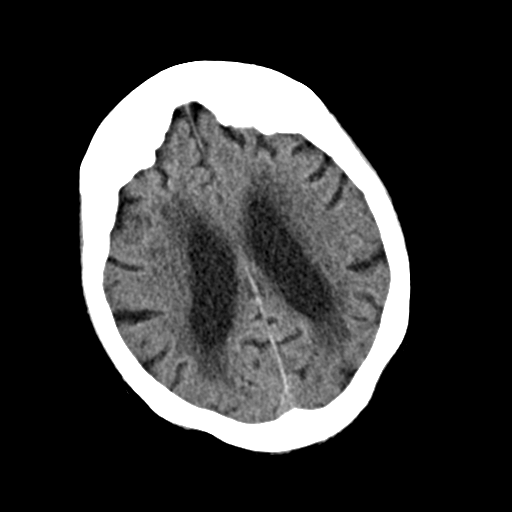
[im 16/28  bone]
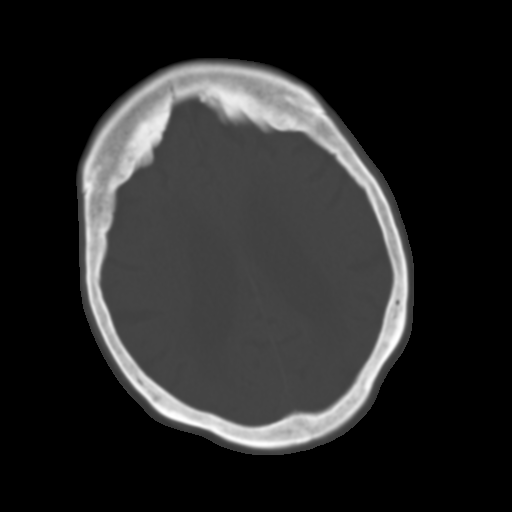
[im 19/28  brain]
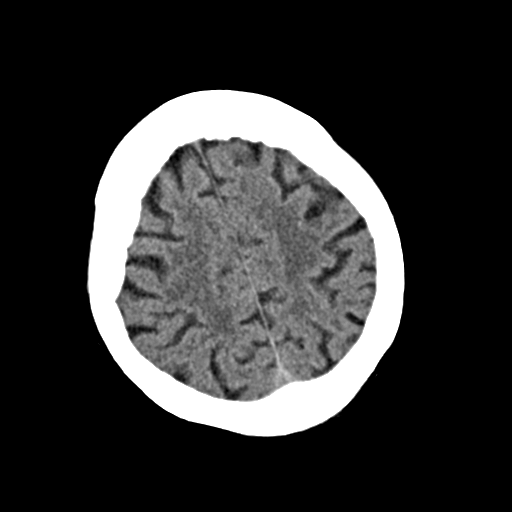
[im 23/28  brain]
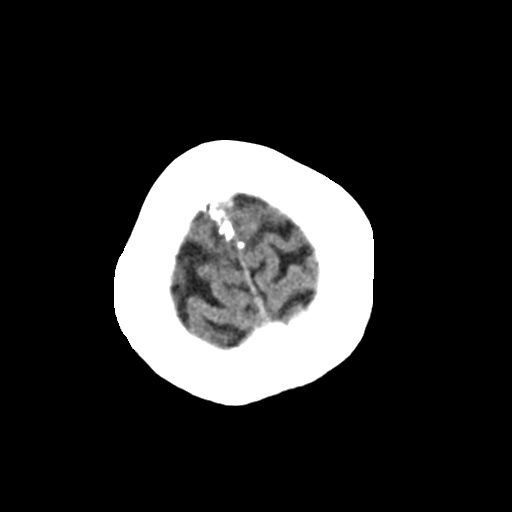
[im 26/28  brain]
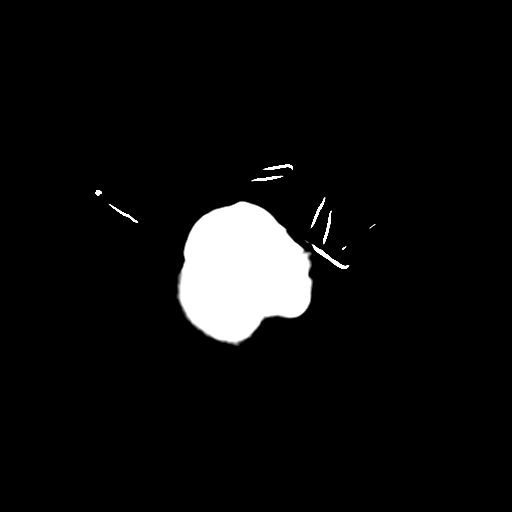

[Series 4: coronal soft · coronal · 0.28mm/px · 3 of 71 slices shown]
[im 24/71  brain]
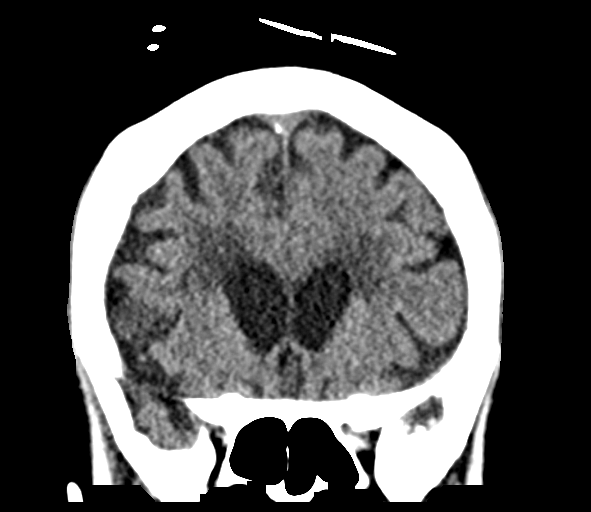
[im 32/71  brain]
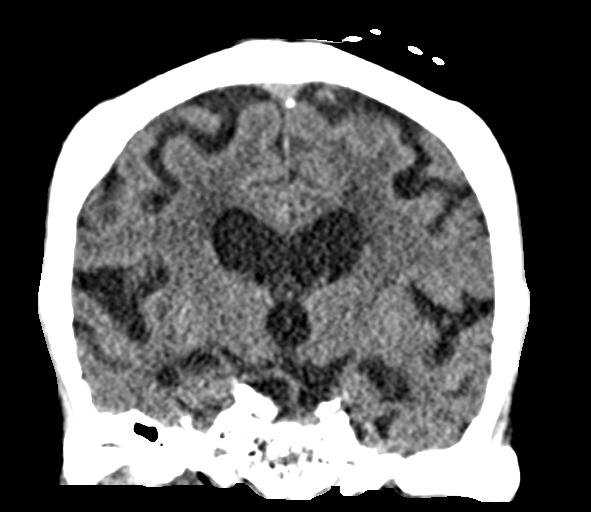
[im 39/71  brain]
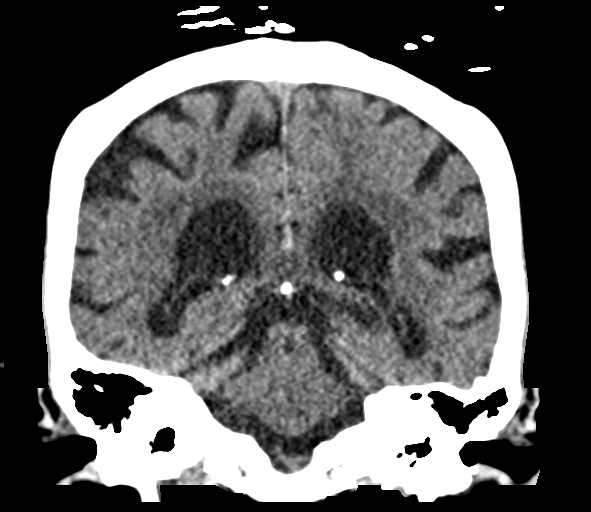

[Series 5: sag soft · sagittal · 0.27mm/px · 3 of 49 slices shown]
[im 17/49  brain]
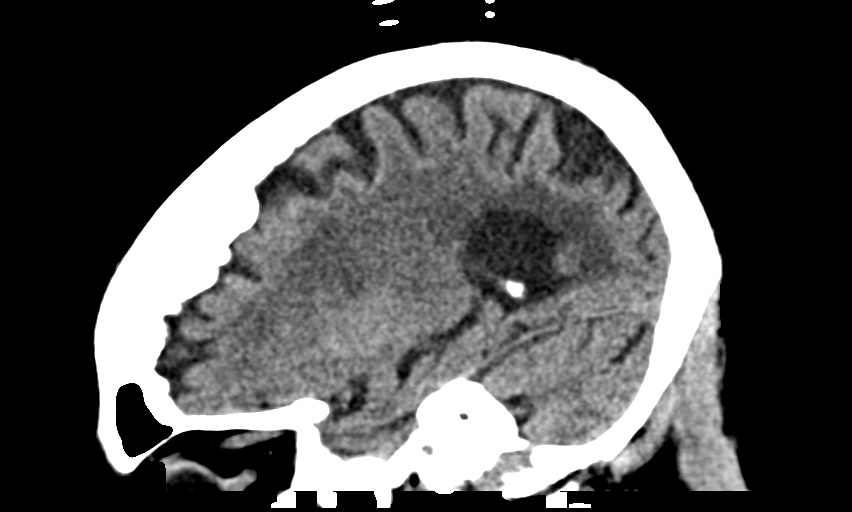
[im 25/49  brain]
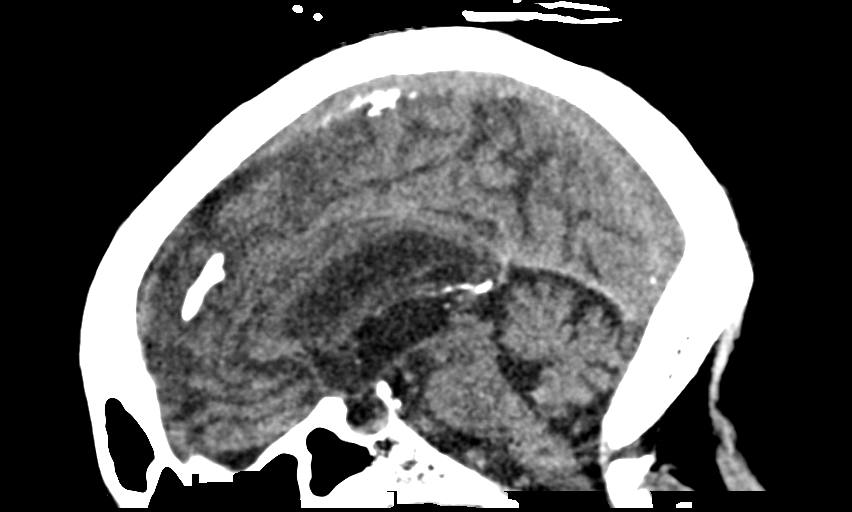
[im 33/49  brain]
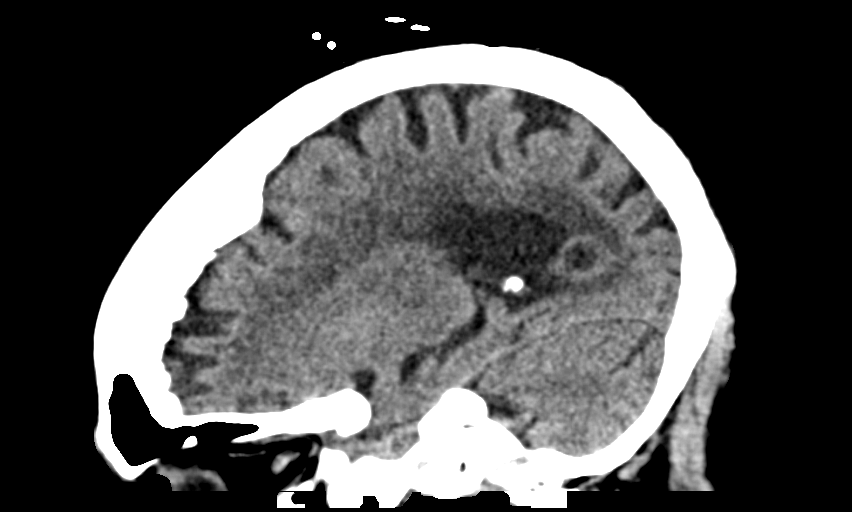

[14 of 45 positions shown; findings below may reference images not displayed]

FINDINGS: Brain: No acute intracranial hemorrhage. No focal mass lesion. No CT
evidence of acute infarction. No midline shift or mass effect. Mild
hydrocephalus. Basilar cisterns are patent.

There are periventricular and subcortical white matter
hypodensities. Generalized cortical atrophy.

Vascular: No hyperdense vessel or unexpected calcification.

Skull: Normal. Negative for fracture or focal lesion.

Sinuses/Orbits: Paranasal sinuses and mastoid air cells are clear.
Orbits are clear.

Other: None.
IMPRESSION: 1. No acute intracranial findings.
2. Atrophy and white matter microvascular disease.
3. Mild hydrocephalus proportional to atrophy.

## 2019-09-24 IMAGING — CT CT ABD-PELV W/ CM
2 of 5 series · 16 of 46 positions shown, 18 images · IV contrast (Omnipaque)
Comparison: CT dated [DATE]

CLINICAL DATA: Abdominal pain. History of primary cancer of the
ampulla Vater.

EXAM:
CT ABDOMEN AND PELVIS WITH CONTRAST
TECHNIQUE: Multidetector CT imaging of the abdomen and pelvis was performed
using the standard protocol following bolus administration of
intravenous contrast.
CONTRAST:  100mL OMNIPAQUE IOHEXOL 350 MG/ML SOLN

[Series 2: axial st · axial · 0.83mm/px · z∈[-372,+48]mm · 13 of 94 slices shown, 15 images]
[im 5/94  soft-tissue]
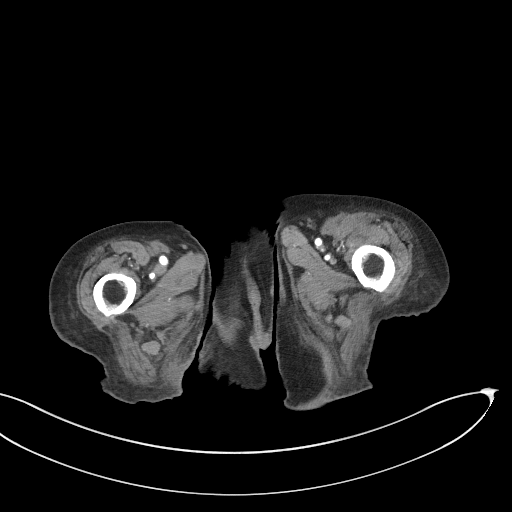
[im 5/94  bone]
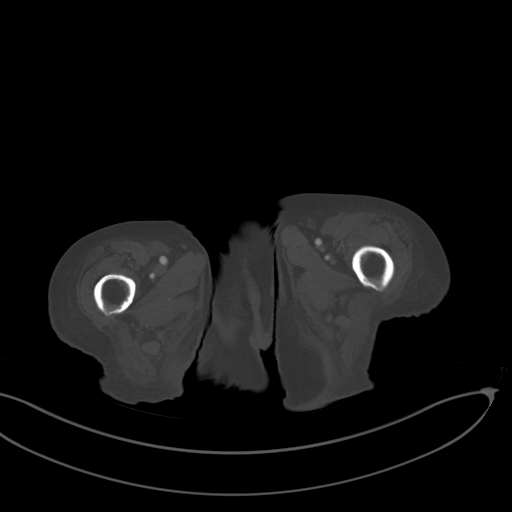
[im 15/94  soft-tissue]
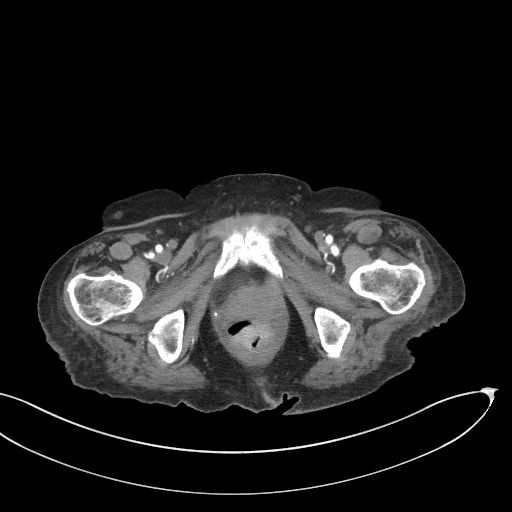
[im 20/94  soft-tissue]
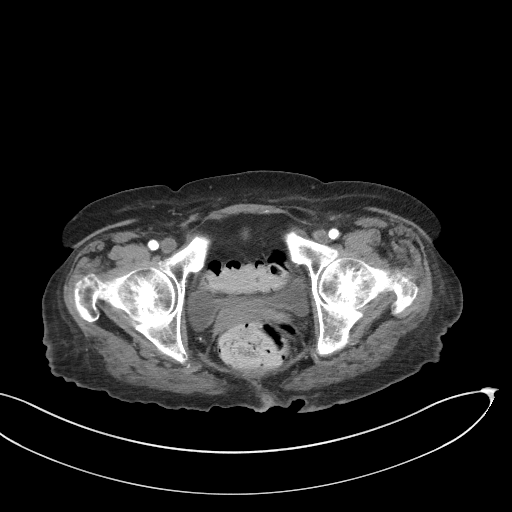
[im 25/94  soft-tissue]
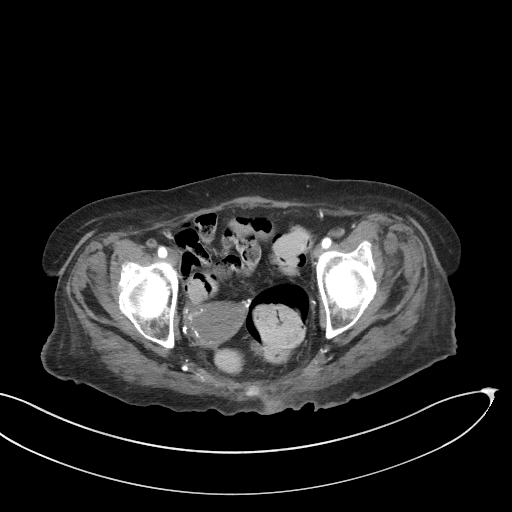
[im 35/94  soft-tissue]
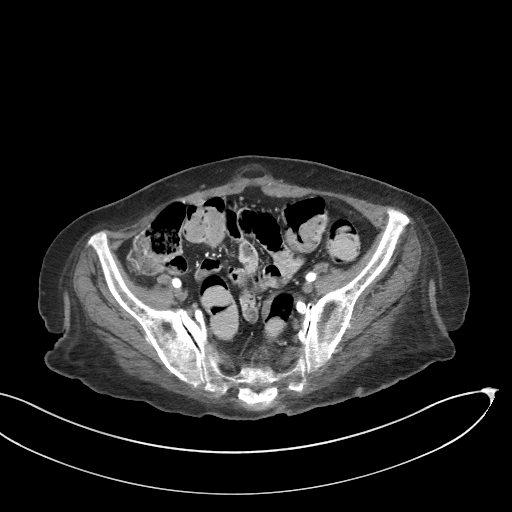
[im 40/94  soft-tissue]
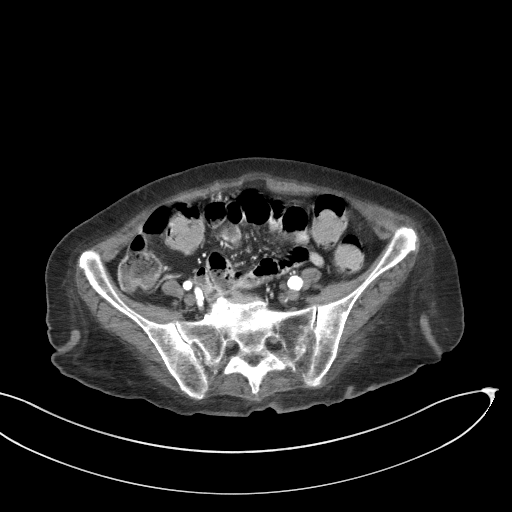
[im 49/94  soft-tissue]
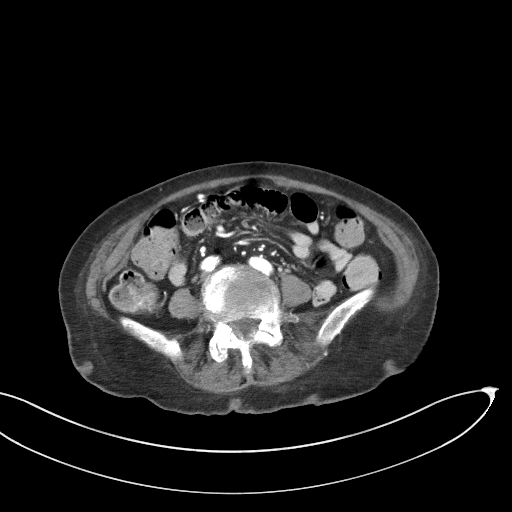
[im 54/94  soft-tissue]
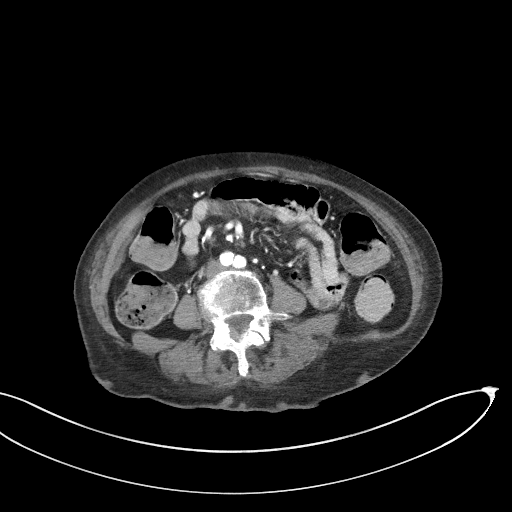
[im 59/94  soft-tissue]
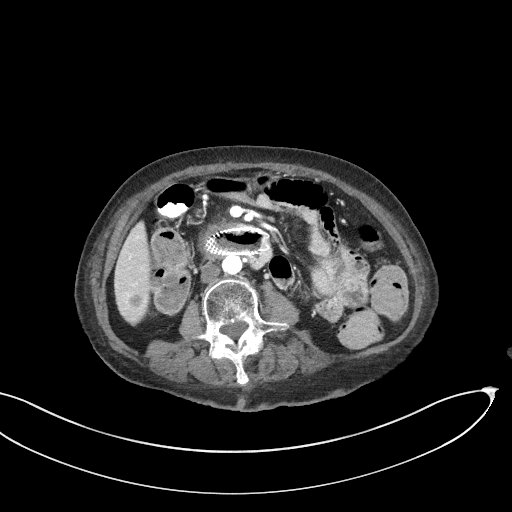
[im 59/94  bone]
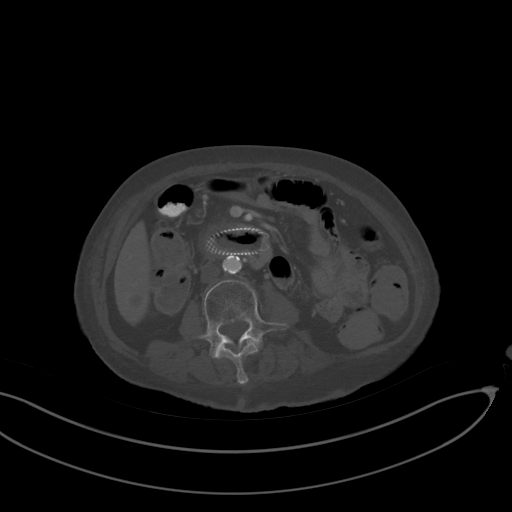
[im 69/94  soft-tissue]
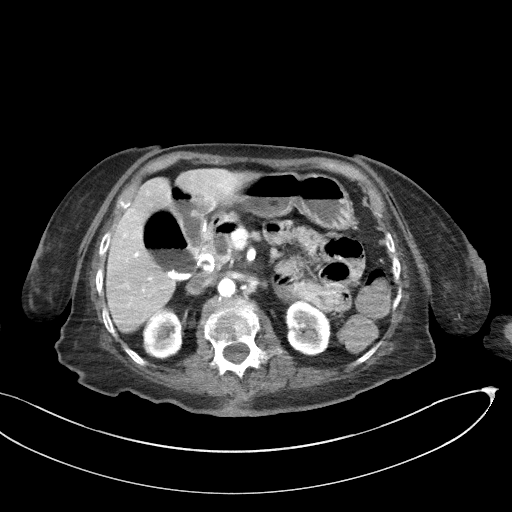
[im 74/94  soft-tissue]
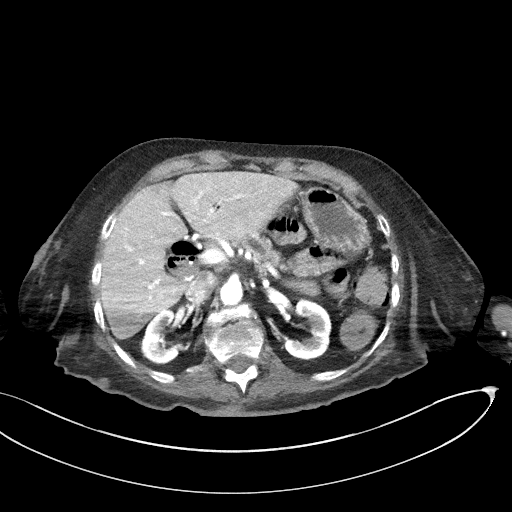
[im 79/94  soft-tissue]
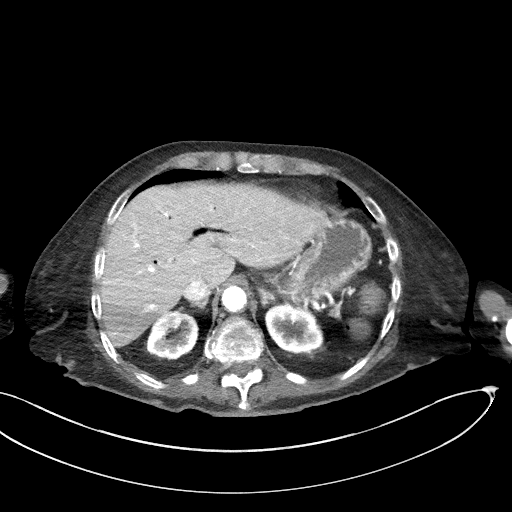
[im 89/94  soft-tissue]
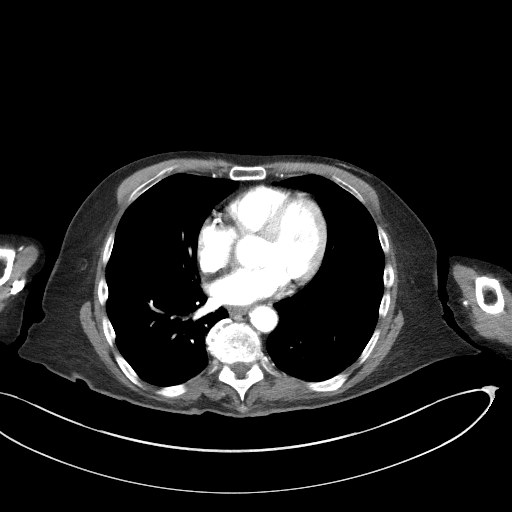

[Series 4: coronal st · coronal · 0.83mm/px · 3 of 79 slices shown]
[im 27/79  soft-tissue]
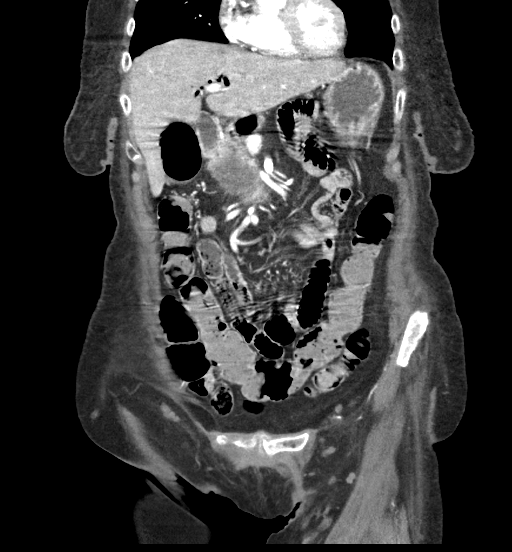
[im 35/79  soft-tissue]
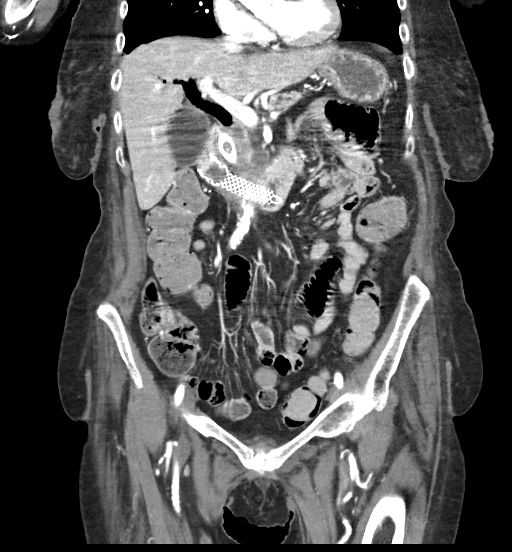
[im 44/79  soft-tissue]
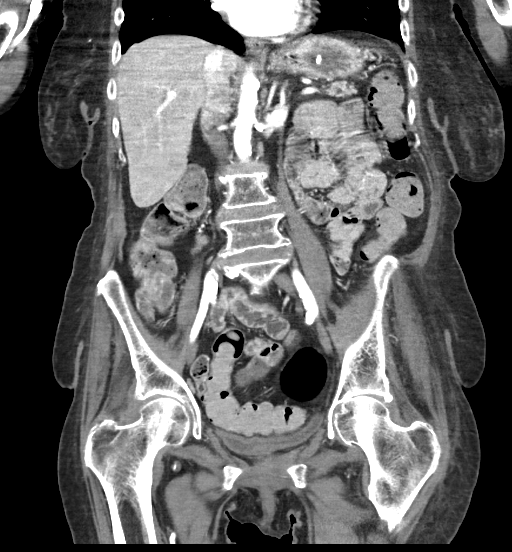

[16 of 46 positions shown; findings below may reference images not displayed]

FINDINGS: Lower chest: The lung bases are clear. The heart size is normal.

Hepatobiliary: There appears to be a new hypoattenuating lesion
measuring 1.2 cm in the right hepatic lobe (axial series 2, image
31). There is a new hypoattenuating lesion in hepatic segment 6
measuring approximately 1.6 cm (axial series 7, image 29). There is
cholelithiasis without CT evidence for acute cholecystitis. Gas is
noted within the gallbladder.There is pneumobilia as before. There
is a common bile duct stent that appears to be grossly patent.

Pancreas: The pancreatic duct is dilated and air-filled. Again noted
is an AV more fists mass at the head of the pancreas which appears
to have significantly increased in size from the prior study and now
measures approximately 5.3 by 4.1 cm. This mass abuts the SMV.

Spleen: Unremarkable.

Adrenals/Urinary Tract:

--Adrenal glands: Unremarkable.

--Right kidney/ureter: No hydronephrosis or radiopaque kidney
stones.

--Left kidney/ureter: No hydronephrosis or radiopaque kidney stones.

--Urinary bladder: Unremarkable.

Stomach/Bowel:

--Stomach/Duodenum: There is a patent duodenal stent in place.

--Small bowel: Unremarkable.

--Colon: Unremarkable.

--Appendix: Normal.

Vascular/Lymphatic: Atherosclerotic calcification is present within
the non-aneurysmal abdominal aorta, without hemodynamically
significant stenosis. There are apparent filling defects within the
right common femoral vein and perhaps the left common femoral vein
as well. The filling defects within the right common femoral vein
appear to extend into the right superficial femoral vein.

--No retroperitoneal lymphadenopathy.

--No mesenteric lymphadenopathy.

--No pelvic or inguinal lymphadenopathy.

Reproductive: Unremarkable

Other: No ascites or free air. The abdominal wall is normal.

Musculoskeletal. No acute displaced fractures.
IMPRESSION: 1. Findings suspicious for bilateral lower extremity deep vein
thrombosis as detailed above. A dedicated bilateral lower extremity
ultrasound is recommended for further evaluation.
2. Growing mass at the head of the pancreas at the level of the
ampulla of Vater. There are new hypoattenuating right hepatic lobe
lesions concerning for interval development of metastatic disease.
3. Patent common bile duct stent.
4. There is cholelithiasis without secondary signs of acute
cholecystitis.
5. Grossly patent duodenal stent without evidence for a bowel
obstruction.

Aortic Atherosclerosis ([BL]-[BL]).

## 2019-09-24 MED ORDER — MORPHINE SULFATE (PF) 4 MG/ML IV SOLN: 4.0000 mg | Freq: Once | INTRAVENOUS | Status: DC

## 2019-09-24 MED ORDER — ONDANSETRON HCL 4 MG/2ML IJ SOLN: 4.0000 mg | Freq: Once | INTRAMUSCULAR | Status: DC

## 2019-09-24 MED ORDER — IOHEXOL 350 MG/ML SOLN
100.0000 mL | Freq: Once | INTRAVENOUS | Status: AC | PRN
  Administered 2019-09-24: 100 mL via INTRAVENOUS

## 2019-09-24 NOTE — ED Provider Notes (Signed)
[place] EMERGENCY DEPARTMENT Provider Note   CSN: [id number] Arrival date & time: 09/24/19  1633     History Chief Complaint  Patient presents with  . Headache  . Abdominal Pain    Debra [name] is a 86 y.o. female possible history of CKD, diabetes, hypertension, adenocarcinoma of the pancreas who presents for evaluation of headache, abdominal pain.  Daughter is at bedside who provides much of the history.  She reports that today, patient slept later than normal.  She woke up at about 10 AM.  Patient was complaining of feeling fatigued and stating "I just do not feel right."  She also had complained of some abdominal pain.  They do give her some applesauce which she was able to eat.  Did not have any vomiting.  She any diarrhea.  Her last bowel movement was yesterday.  Patient has not had any fever.  Currently patient denies any symptoms but patient's daughter states that patient has a history of dementia and can sometimes get confused.  Does feel like she is at her baseline.  Daughter is concerned because last time that they had this happen, patient was found to have a bowel obstruction. Daughter does not think patient has had any fall.   EM LEVEL 5 CAVEAT DUE TO DEMENTIA  The history is provided by the patient.       Past Medical History:  Diagnosis Date  . CKD (chronic kidney disease)   . Diabetes mellitus without complication ([place])   . Gout   . Hypercholesteremia   . Hypertension   . 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 Malignant neoplasm of head of pancreas ([place])   . Pain in the abdomen 08/15/2019  . Duodenal obstruction 08/15/2019  . High serum lactate 08/15/2019  . Thrombocytosis ([place]) 08/15/2019  . Goals of care, counseling/discussion 05/29/2019  . 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 Protein-calorie malnutrition, severe 05/20/2019  . Acute cholangitis 05/18/2019  .  Unintentional weight loss 05/18/2019  . Slurred speech 05/18/2019  . HLD (hyperlipidemia) 05/18/2019  . HTN (hypertension) 05/18/2019  . DM (diabetes mellitus), type 2 ([place]) 05/18/2019  . Hypokalemia 05/18/2019  . TIA (transient ischemic attack) 05/18/2019  . Elevated bilirubin   . Transaminitis   . Gallstone pancreatitis   . Choledocholithiasis   . Dilated cbd, acquired   . Pancreatic duct dilated   . Lactic acidosis   . Poor appetite 04/13/2019  . Dilation of biliary tract 04/13/2019  . Abnormal CT scan, gallbladder 04/13/2019  . Abnormal weight loss 04/13/2019    Past Surgical History:  Procedure Laterality Date  . BILIARY STENT PLACEMENT  05/20/2019   Procedure: BILIARY STENT PLACEMENT;  Surgeon: Milus Banister, MD;  Location: [facility] ENDOSCOPY;  Service: Endoscopy;;  . BIOPSY  05/20/2019   Procedure: BIOPSY;  Surgeon: Milus Banister, MD;  Location: [facility] ENDOSCOPY;  Service: Endoscopy;;  . DUODENAL STENT PLACEMENT N/A 08/17/2019   Procedure: DUODENAL STENT PLACEMENT;  Surgeon: Milus Banister, MD;  Location: [facility] ENDOSCOPY;  Service: Endoscopy;  Laterality: N/A;  . ERCP N/A 05/20/2019   Procedure: ENDOSCOPIC RETROGRADE CHOLANGIOPANCREATOGRAPHY (ERCP);  Surgeon: Milus Banister, MD;  Location: [facility] ENDOSCOPY;  Service: Endoscopy;  Laterality: N/A;  . ESOPHAGOGASTRODUODENOSCOPY (EGD) WITH PROPOFOL N/A 08/17/2019   Procedure: ESOPHAGOGASTRODUODENOSCOPY (EGD) WITH PROPOFOL;  Surgeon: Milus Banister, MD;  Location: [facility] ENDOSCOPY;  Service: Endoscopy;  Laterality: N/A;  .  TOTAL KNEE ARTHROPLASTY Left      OB History    Gravida  5   Para  5   Term  5   Preterm      AB      Living        SAB      TAB      Ectopic      Multiple      Live Births              Family History  Problem Relation Age of Onset  . Diabetes Mother   . Hypertension Father   . Breast cancer Niece   . Colon cancer Neg Hx     Social History   Tobacco Use  . Smoking status: Never Smoker  .  Smokeless tobacco: Never Used  Substance Use Topics  . Alcohol use: No  . Drug use: No    Home Medications Prior to Admission medications   Medication Sig Start Date End Date Taking? Authorizing Provider  ACCU-CHEK GUIDE test strip 2 (two) times daily. use for testing 05/23/19   [provider]  Accu-Chek Softclix Lancets lancets USE TWICE DAILY with strips 05/23/19   [provider]  acetaminophen (TYLENOL) 325 MG tablet Take 325 mg by mouth every 6 (six) hours as needed for mild pain, moderate pain, fever or headache.    [provider]  Blood Glucose Monitoring Suppl (ACCU-CHEK GUIDE) w/Device KIT USE AS DIRECTED TO test TWICE DAILY 05/23/19   [provider]  feeding supplement, GLUCERNA SHAKE, (GLUCERNA SHAKE) LIQD Take 237 mLs by mouth 3 (three) times daily between meals. 08/20/19   Debbe Odea, MD  latanoprost (XALATAN) 0.005 % ophthalmic solution Place 1 drop into both eyes at bedtime.     [provider]  megestrol (MEGACE ES) 625 MG/5ML suspension Take 5 mLs (625 mg total) by mouth daily. 06/30/19   Truitt Merle, MD  metFORMIN (GLUCOPHAGE-XR) 500 MG 24 hr tablet Take 500 mg by mouth daily with breakfast.     [provider]  omeprazole (PRILOSEC) 20 MG capsule Take 1 capsule (20 mg total) by mouth daily. 07/29/19   Truitt Merle, MD  potassium chloride (KLOR-CON) 10 MEQ tablet Take 1 tablet (10 mEq total) by mouth daily. 07/29/19   Truitt Merle, MD  senna (SENOKOT) 8.6 MG TABS tablet Take 1 tablet (8.6 mg total) by mouth at bedtime as needed for mild constipation. 05/23/19   Regalado, Belkys A, MD  triamcinolone (KENALOG) 0.025 % cream Apply 1 application topically 2 (two) times daily as needed ([place]).  03/23/19   [provider]    Allergies    Other  Review of Systems   Review of Systems  Unable to perform ROS: Dementia    Physical Exam Updated Vital Signs BP 97/69 (BP Location: Right Arm)   Pulse (!) 107   Temp 98 F (36.7 C)  (Oral)   Resp 20   Wt 49.9 kg   SpO2 100%   BMI 18.30 kg/m   Physical Exam Vitals and nursing note reviewed.  Constitutional:      Appearance: Normal appearance. She is well-developed.     Comments: Frail and elderly appearing  HENT:     Head: Normocephalic and atraumatic.  Eyes:     General: Lids are normal.     Conjunctiva/sclera: Conjunctivae normal.     Pupils: Pupils are equal, round, and reactive to light.  Cardiovascular:     Rate and Rhythm: Normal  rate and regular rhythm.     Pulses: Normal pulses.     Heart sounds: Normal heart sounds. No murmur heard.  No friction rub. No gallop.   Pulmonary:     Effort: Pulmonary effort is normal.     Breath sounds: Normal breath sounds.     Comments: Lungs clear to auscultation bilaterally.  Symmetric chest rise.  No wheezing, rales, rhonchi. Abdominal:     Palpations: Abdomen is soft. Abdomen is not rigid.     Tenderness: There is no abdominal tenderness. There is no guarding.     Comments: Abdomen is soft, non-distended, non-tender. No rigidity, No guarding. No peritoneal signs.  Musculoskeletal:        General: Normal range of motion.     Cervical back: Full passive range of motion without pain.  Skin:    General: Skin is warm and dry.     Capillary Refill: Capillary refill takes less than 2 seconds.  Neurological:     Mental Status: She is alert and oriented to person, place, and time.     Comments: Limited exam secondary to dementia.  Follows commands, Moves all extremities  5/5 strength to BUE and BLE  Sensation intact throughout all major nerve distributions  Psychiatric:        Speech: Speech normal.     ED Results / Procedures / Treatments   Labs (all labs ordered are listed, but only abnormal results are displayed) Labs Reviewed  COMPREHENSIVE METABOLIC PANEL - Abnormal; Notable for the following components:      Result Value   Sodium 130 (*)    CO2 19 (*)    Glucose, Bld 108 (*)    BUN 28 (*)     Creatinine, Ser 1.09 (*)    Albumin 3.4 (*)    GFR calc non Af Amer 46 (*)    GFR calc Af Amer 53 (*)    All other components within normal limits  CBC WITH DIFFERENTIAL/PLATELET - Abnormal; Notable for the following components:   WBC 10.6 (*)    HCT 35.7 (*)    Platelets 545 (*)    Lymphs Abs 4.5 (*)    All other components within normal limits  SARS CORONAVIRUS 2 BY RT PCR (HOSPITAL ORDER, [place] [facility])  LIPASE, BLOOD  URINALYSIS, ROUTINE W REFLEX MICROSCOPIC    EKG None  Radiology CT Head Wo Contrast  Result Date: 09/24/2019 CLINICAL DATA:  Per daughter pt c/o HA, abd pain x today-denies v/d-pt with dementia-NAD-to triage in w/cHeadache, new or worsening (Age >= 50y) EXAM: CT HEAD WITHOUT CONTRAST TECHNIQUE: Contiguous axial images were obtained from the base of the skull through the vertex without intravenous contrast. COMPARISON:  None. FINDINGS: Brain: No acute intracranial hemorrhage. No focal mass lesion. No CT evidence of acute infarction. No midline shift or mass effect. Mild hydrocephalus. Basilar cisterns are patent. There are periventricular and subcortical white matter hypodensities. Generalized cortical atrophy. Vascular: No hyperdense vessel or unexpected calcification. Skull: Normal. Negative for fracture or focal lesion. Sinuses/Orbits: Paranasal sinuses and mastoid air cells are clear. Orbits are clear. Other: None. IMPRESSION: 1. No acute intracranial findings. 2. Atrophy and white matter microvascular disease. 3. Mild hydrocephalus proportional to atrophy. Electronically Signed   By: Suzy Bouchard M.D.   On: 09/24/2019 18:32    Procedures Procedures (including critical care time)  Medications Ordered in ED Medications  iohexol (OMNIPAQUE) 350 MG/ML injection 100 mL (100 mLs Intravenous Contrast Given 09/24/19 1807)  ED Course  I have reviewed the triage vital signs and the nursing notes.  Pertinent labs & imaging results that were  available during my care of the patient were reviewed by me and considered in my medical decision making (see chart for details).    MDM Rules/Calculators/A&P                          86 year old female past mostly hypertension, GERD, pancreatic cancer brought in for evaluation of abdominal pain and headache and "not feeling good."  Patient currently denies any symptoms.  Daughter states that last time she was like this, patient ended up having a bowel obstruction.  She states that patient has not been eating much but has not had any vomiting.  Last bowel movement was yesterday.  On initial ED arrival, patient is afebrile, nontoxic-appearing.  Vital signs are stable. On exam, patient has a benign abdominal exam.  No evidence of neuro deficits.  We will plan to check basic labs, obtain CT head, CT on pelvis.  Daughter does not know of any falls but patient is unreliable given history of dementia.  Lipase is normal.  CMP shows BUN of 28, creatinine 1.09.  CBC shows leukocytosis of 10.6.  Patient signed out to Dr. Sedonia Small pending scans, urine and COVID.  Portions of this note were generated with [employer]. Dictation errors may occur despite best attempts at proofreading.   Final Clinical Impression(s) / ED Diagnoses Final diagnoses:  Generalized abdominal pain    Rx / DC Orders ED Discharge Orders    None       Desma Mcgregor 09/24/19 1848    Maudie Flakes, MD 09/24/19 2005

## 2019-09-24 NOTE — Discharge Instructions (Addendum)
You were evaluated in the Emergency Department and after careful evaluation, we did not find any emergent condition requiring admission or further testing in the hospital.  Your exam/testing today is overall reassuring.  Your CT scans showed some evidence of blood clots in the legs.  As discussed, please call Dr. Ernestina Penna office to discuss the need for blood thinners.  Please return to the Emergency Department if you experience any worsening of your condition.   Thank you for allowing [place] to be a part of your care.

## 2019-09-24 NOTE — ED Notes (Signed)
Patient transported to CT 

## 2019-09-24 NOTE — ED Notes (Signed)
Baseline dementia. Pt c/o abd pain and headache. Skin warm and dry. Alert, talkative. Cooperative. No acute distress noted. Daughter at bedside.

## 2019-09-24 NOTE — ED Notes (Signed)
Dr. Sedonia Small ED Provider at bedside.

## 2019-09-24 NOTE — ED Triage Notes (Signed)
Per daughter pt c/o HA, abd pain x today-denies v/d-pt with dementia-NAD-to triage in w/c

## 2019-09-25 ENCOUNTER — Ambulatory Visit (HOSPITAL_BASED_OUTPATIENT_CLINIC_OR_DEPARTMENT_OTHER): Payer: Medicare Other | Admitting: Hematology

## 2019-09-25 ENCOUNTER — Encounter: Payer: Self-pay | Admitting: Hematology

## 2019-09-25 DIAGNOSIS — C241 Malignant neoplasm of ampulla of Vater: Secondary | ICD-10-CM

## 2019-09-25 DIAGNOSIS — E43 Unspecified severe protein-calorie malnutrition: Secondary | ICD-10-CM | POA: Diagnosis not present

## 2019-09-25 DIAGNOSIS — Z515 Encounter for palliative care: Secondary | ICD-10-CM | POA: Diagnosis not present

## 2019-09-25 DIAGNOSIS — I82413 Acute embolism and thrombosis of femoral vein, bilateral: Secondary | ICD-10-CM | POA: Diagnosis not present

## 2019-09-25 NOTE — Progress Notes (Signed)
[place]   [phone] [phone]   Clinic Follow up Note   Patient Care Team: Asencion Noble, MD as PCP - General (Internal Medicine) Gala Romney Cristopher Estimable, MD as Consulting Physician (Gastroenterology) Jonnie Finner, RN as Oncology Nurse Navigator Truitt Merle, MD as Consulting Physician (Hematology) Heilingoetter, Tobe Sos, PA-C as Physician Assistant (Physician Assistant)   I connected with Debra [name] on 09/25/2019 at  1:40 PM EDT by telephone visit and verified that I am speaking with the correct person using two identifiers.  I discussed the limitations, risks, security and privacy concerns of performing an evaluation and management service by telephone and the availability of in person appointments. I also discussed with the patient that there may be a patient responsible charge related to this service. The patient expressed understanding and agreed to proceed.   Other persons participating in the visit and their role in the encounter:  Her children  Patient's location:  Her home  Provider's location:  My Office   CHIEF COMPLAINT: F/u ofAmpullary carcinoma  SUMMARY OF ONCOLOGIC HISTORY: Oncology History Overview Note  Cancer Staging Ampullary carcinoma ([place]) Staging form: Small Intestine - Adenocarcinoma, AJCC 8th Edition - Clinical stage from 05/20/2019: Stage Unknown (cTX, cN0, cM0) - Signed by Truitt Merle, MD on 06/30/2019    Ampullary carcinoma ([place])  03/20/2019 Imaging   1. Dilated gallbladder with cholelithiasis and mild wall thickening. This is suspicious for cholecystitis. Recommend clinical correlation, and consider nuclear medicine hepatobiliary scan for further evaluation. 2. Diffuse biliary ductal dilatation, with common bile duct measuring 14 mm in diameter. Recommend correlation with liver function tests, and consider MRCP or ERCP for further evaluation.   05/18/2019 Imaging   Profound intra and extrahepatic biliary  ductal dilatation in addition to dilatation of the pancreatic duct, slightly increased compared to prior examination dated 03/20/2019 and highly concerning for a small ampullary or pancreatic malignancy which is not directly visualized.   05/20/2019 Initial Diagnosis   Ampullary carcinoma ([place])   05/20/2019 Procedure   ERCP under the care of Dr. 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 This was biopsied extensively. - The mass causes a short, abrupt distal CBD stricture which was stented with a 4cm long 51m diameter uncovered metal mesh stent.   05/20/2019 Pathology Results   SURGICAL PATHOLOGY [id number]   A. AMPULLARY, BIOPSY: - Poorly differentiated adenocarcinoma   05/20/2019 Cancer Staging   Staging form: Small Intestine - Adenocarcinoma, AJCC 8th Edition - Clinical stage from 05/20/2019: Stage Unknown (cTX, cN0, cM0) - Signed by FTruitt Merle MD on 06/30/2019   06/03/2019 Imaging   CT Chest wo Contrast  IMPRESSION: 1. No evidence of metastatic disease within the chest, study mildly limited by respiratory motion at the lung bases. 2. Dilated central pulmonary vasculature up to 3.6 cm, likely reflects pulmonary arterial hypertension 3. Three-vessel coronary artery calcification. 4. Aortic atherosclerosis.   Aortic Atherosclerosis (ICD10-I70.0).   06/17/2019 - 06/26/2019 Radiation Therapy   SBRT with Dr MLisbeth Renshaw6/2/21-6/11/21.    08/15/2019 Imaging   CT AP w contrast IMPRESSION: 1. Ill-defined low-density mass at the region of the pancreatic head and ampulla measuring up to 3.0 cm compatible with known biopsy-proven ampullary carcinoma. Findings suggest invasion/obstruction of the duodenal lumen at this level. The stomach and proximal duodenum are markedly dilated upstream from the level of the ampullary tumor. Findings compatible with mechanical outlet obstruction. 2. Interval placement of biliary stent within the  common  bile duct. Intrahepatic biliary dilatation with postprocedural pneumobilia. 3. Cholelithiasis. 4. Aortic atherosclerosis. (ICD10-I70.0).   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 This was stented with a 6cm long 19m diameter uncovered metal Wallstent in apparent good position via fluoroscopy.   09/24/2019 Imaging   CT head  IMPRESSION: 1. No acute intracranial findings. 2. Atrophy and white matter microvascular disease. 3. Mild hydrocephalus proportional to atrophy.   09/24/2019 Imaging   CT AP w contrast  IMPRESSION: 1. Findings suspicious for bilateral lower extremity deep vein thrombosis as detailed above. A dedicated bilateral lower extremity ultrasound is recommended for further evaluation. 2. Growing mass at the head of the pancreas at the level of the ampulla of Vater. There are new hypoattenuating right hepatic lobe lesions concerning for interval development of metastatic disease. 3. Patent common bile duct stent. 4. There is cholelithiasis without secondary signs of acute cholecystitis. 5. Grossly patent duodenal stent without evidence for a bowel obstruction.   Aortic Atherosclerosis (ICD10-I70.0).      CURRENT THERAPY:  Observation  INTERVAL HISTORY:  Debra RIESENis scheduled for a phone visit after her ED visit yesterday.  She is currently under Palliative Care who sees her at home every 2 weeks by KJoelene Millin Her daughter notes her dementia continues to progress. She had headaches in the last few days an went to ED yesterday. She was found to have possible DVT on CT. Her legs are not swollen per her children. She is not eating adequately. She have been taking nutritional supplements and protein shakes. She has daily BMs.    REVIEW OF SYSTEMS:   Constitutional: Denies fevers, chills or  abnormal weight loss Eyes: Denies blurriness of vision Ears, nose, mouth, throat, and face: Denies mucositis or sore throat Respiratory: Denies cough, dyspnea or wheezes Cardiovascular: Denies palpitation, chest discomfort or lower extremity swelling Gastrointestinal:  Denies nausea, heartburn or change in bowel habits Skin: Denies abnormal skin rashes Lymphatics: Denies new lymphadenopathy or easy bruising Neurological:Denies numbness, tingling or new weaknesses Behavioral/Psych: Mood is stable, no new changes  All other systems were reviewed with the patient and are negative.  MEDICAL HISTORY:  Past Medical History:  Diagnosis Date  . CKD (chronic kidney disease)   . Diabetes mellitus [place] complication ([place]   . Gout   . Hypercholesteremia   . Hypertension   . 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 BILIARY STENT PLACEMENT  05/20/2019   Procedure: BILIARY STENT PLACEMENT;  Surgeon: JMilus Banister MD;  [facility];  Service: Endoscopy;;  . BIOPSY  05/20/2019   Procedure: BIOPSY;  Surgeon: JMilus Banister MD;  [facility];  Service: Endoscopy;;  . DUODENAL STENT PLACEMENT N/A 08/17/2019   Procedure: DUODENAL STENT PLACEMENT;  Surgeon: JMilus Banister MD;  Location: [facility] ENDOSCOPY;  Service: Endoscopy;  Laterality: N/A;  . ERCP N/A 05/20/2019   Procedure: ENDOSCOPIC RETROGRADE CHOLANGIOPANCREATOGRAPHY (ERCP);  Surgeon: JMilus Banister MD;  [facility];  Service: Endoscopy;  Laterality: N/A;  . ESOPHAGOGASTRODUODENOSCOPY (EGD) WITH PROPOFOL N/A 08/17/2019   Procedure: ESOPHAGOGASTRODUODENOSCOPY (EGD) WITH PROPOFOL;  Surgeon: JMilus Banister MD;  Location: [facility] ENDOSCOPY;  Service: Endoscopy;  Laterality: N/A;  . TOTAL KNEE ARTHROPLASTY Left     I have reviewed the  social history and family history with the patient and they are unchanged from previous  note.  ALLERGIES:  is allergic to other.  MEDICATIONS:  Current Outpatient Medications  Medication Sig Dispense Refill  . ACCU-CHEK GUIDE test strip 2 (two) times daily. use for testing    . Accu-Chek Softclix Lancets lancets USE TWICE DAILY with strips    . acetaminophen (TYLENOL) 325 MG tablet Take 325 mg by mouth every 6 (six) hours as needed for mild pain, moderate pain, fever or headache.    . Blood Glucose Monitoring Suppl (ACCU-CHEK GUIDE) w/Device KIT USE AS DIRECTED TO test TWICE DAILY    . feeding supplement, GLUCERNA SHAKE, (GLUCERNA SHAKE) LIQD Take 237 mLs by mouth 3 (three) times daily between meals. 90 mL 0  . latanoprost (XALATAN) 0.005 % ophthalmic solution Place 1 drop into both eyes at bedtime.     . megestrol (MEGACE ES) 625 MG/5ML suspension Take 5 mLs (625 mg total) by mouth daily. 150 mL 0  . metFORMIN (GLUCOPHAGE-XR) 500 MG 24 hr tablet Take 500 mg by mouth daily with breakfast.     . omeprazole (PRILOSEC) 20 MG capsule Take 1 capsule (20 mg total) by mouth daily. 30 capsule 3  . potassium chloride (KLOR-CON) 10 MEQ tablet Take 1 tablet (10 mEq total) by mouth daily. 15 tablet 0  . senna (SENOKOT) 8.6 MG TABS tablet Take 1 tablet (8.6 mg total) by mouth at bedtime as needed for mild constipation. 120 tablet 0  . triamcinolone (KENALOG) 0.025 % cream Apply 1 application topically 2 (two) times daily as needed (Rash).      No current facility-administered medications for this visit.    PHYSICAL EXAMINATION: ECOG PERFORMANCE STATUS: 2 - Symptomatic, <50% confined to bed  No vitals taken today, Exam not performed today   LABORATORY DATA:  I have reviewed the data as listed CBC Latest Ref Rng & Units 09/24/2019 08/19/2019 08/18/2019  WBC 4.0 - 10.5 K/uL 10.6(H) 6.9 7.6  Hemoglobin 12.0 - 15.0 g/dL 12.1 10.9(L) 10.9(L)  Hematocrit 36 - 46 % 35.7(L) 32.9(L) 32.0(L)  Platelets 150 - 400 K/uL 545(H) 373 449(H)     CMP Latest Ref Rng & Units 09/24/2019 08/20/2019 08/19/2019   Glucose 70 - 99 mg/dL 108(H) 114(H) 126(H)  BUN 8 - 23 mg/dL 28(H) <5(L) 5(L)  Creatinine 0.44 - 1.00 mg/dL 1.09(H) 0.82 0.81  Sodium 135 - 145 mmol/L 130(L) 132(L) 135  Potassium 3.5 - 5.1 mmol/L 4.6 3.8 3.7  Chloride 98 - 111 mmol/L 99 101 104  CO2 22 - 32 mmol/L 19(L) 22 21(L)  Calcium 8.9 - 10.3 mg/dL 9.4 8.4(L) 8.8(L)  Total Protein 6.5 - 8.1 g/dL 7.3 - 5.7(L)  Total Bilirubin 0.3 - 1.2 mg/dL 0.4 - 0.7  Alkaline Phos 38 - 126 U/L 48 - 62  AST 15 - 41 U/L 15 - 17  ALT 0 - 44 U/L 9 - 13      RADIOGRAPHIC STUDIES: I have personally reviewed the radiological images as listed and agreed with the findings in the report. CT Head Wo Contrast  Result Date: 09/24/2019 CLINICAL DATA:  Per daughter pt c/o HA, abd pain x today-denies v/d-pt with dementia-NAD-to triage in w/cHeadache, new or worsening (Age >= 50y) EXAM: CT HEAD WITHOUT CONTRAST TECHNIQUE: Contiguous axial images were obtained from the base of the skull through the vertex without intravenous contrast. COMPARISON:  None. FINDINGS: Brain: No acute intracranial hemorrhage. No focal mass lesion. No CT evidence of acute infarction. No  midline shift or mass effect. Mild hydrocephalus. Basilar cisterns are patent. There are periventricular and subcortical white matter hypodensities. Generalized cortical atrophy. Vascular: No hyperdense vessel or unexpected calcification. Skull: Normal. Negative for fracture or focal lesion. Sinuses/Orbits: Paranasal sinuses and mastoid air cells are clear. Orbits are clear. Other: None. IMPRESSION: 1. No acute intracranial findings. 2. Atrophy and white matter microvascular disease. 3. Mild hydrocephalus proportional to atrophy. Electronically Signed   By: Suzy Bouchard M.D.   On: 09/24/2019 18:32   CT ABDOMEN PELVIS W CONTRAST  Result Date: 09/24/2019 CLINICAL DATA:  Abdominal pain. History of primary cancer of the ampulla Vater. EXAM: CT ABDOMEN AND PELVIS WITH CONTRAST TECHNIQUE: Multidetector CT  imaging of the abdomen and pelvis was performed using the standard protocol following bolus administration of intravenous contrast. CONTRAST:  153m OMNIPAQUE IOHEXOL 350 MG/ML SOLN COMPARISON:  CT dated August 15, 2019 FINDINGS: Lower chest: The lung bases are clear. The heart size is normal. Hepatobiliary: There appears to be a new hypoattenuating lesion measuring 1.2 cm in the right hepatic lobe (axial series 2, image 31). There is a new hypoattenuating lesion in hepatic segment 6 measuring approximately 1.6 cm (axial series 7, image 29). There is cholelithiasis without CT evidence for acute cholecystitis. Gas is noted within the gallbladder.There is pneumobilia as before. There is a common bile duct stent that appears to be grossly patent. Pancreas: The pancreatic duct is dilated and air-filled. Again noted is an AV more fists mass at the head of the pancreas which appears to have significantly increased in size from the prior study and now measures approximately 5.3 by 4.1 cm. This mass abuts the SMV. Spleen: Unremarkable. Adrenals/Urinary Tract: --Adrenal glands: Unremarkable. --Right kidney/ureter: No hydronephrosis or radiopaque kidney stones. --Left kidney/ureter: No hydronephrosis or radiopaque kidney stones. --Urinary bladder: Unremarkable. Stomach/Bowel: --Stomach/Duodenum: There is a patent duodenal stent in place. --Small bowel: Unremarkable. --Colon: Unremarkable. --Appendix: Normal. Vascular/Lymphatic: Atherosclerotic calcification is present within the non-aneurysmal abdominal aorta, without hemodynamically significant stenosis. There are apparent filling defects within the right common femoral vein and perhaps the left common femoral vein as well. The filling defects within the right common femoral vein appear to extend into the right superficial femoral vein. --No retroperitoneal lymphadenopathy. --No mesenteric lymphadenopathy. --No pelvic or inguinal lymphadenopathy. Reproductive: Unremarkable  Other: No ascites or free air. The abdominal wall is normal. Musculoskeletal. No acute displaced fractures. IMPRESSION: 1. Findings suspicious for bilateral lower extremity deep vein thrombosis as detailed above. A dedicated bilateral lower extremity ultrasound is recommended for further evaluation. 2. Growing mass at the head of the pancreas at the level of the ampulla of Vater. There are new hypoattenuating right hepatic lobe lesions concerning for interval development of metastatic disease. 3. Patent common bile duct stent. 4. There is cholelithiasis without secondary signs of acute cholecystitis. 5. Grossly patent duodenal stent without evidence for a bowel obstruction. Aortic Atherosclerosis (ICD10-I70.0). Electronically Signed   By: CConstance HolsterM.D.   On: 09/24/2019 18:42     ASSESSMENT & PLAN:  MLOVELY KERINSis a 86y.o. female with    1.Ampullary adenocarcinoma, cTxN0M0, s/p radiation  -She wasdiagnosed in5/2021. ERCP performed by Dr. JArdis Hughson 05/20/2019 notedulcerated 2-3cm ampullary mass.The mass causes a short, abrupt distal CBD stricture whichwasstented with a 4cm long 187mdiameter uncovered metal mesh stent.HerPathologywas consistent with ampullary adenocarcinoma. -BaselineCEA 7.5 05/19/19,CA 19.9 <2 05/19/19,staging scan was negative for node or distant metastasis. -Patient is not a candidate for surgery or chemotherapy due to her  advanced age and multiple comorbidities including dementia. -Toprovide some disease controlshe completed SBRT with Dr Lisbeth Renshaw 06/17/19-06/26/19. I reviewed RT is unlikely curative, buthopefully will control her cancer for a while.  -I discussed proceeding with observation now. I will scan heronlyif she develops symptoms concerning for disease progression.  -She is currently under palliative home care and being seen every 2 weeks. She is stable and managing fairly well.  -Her CT CAP from 09/24/19 during her ED visit shows mild increase in her  mass and suspicious liver lesion. At this point I do not recommend more treatment. I encouraged her to continue palliative care.  -I previously recommended hospice, her family was not ready and declined    2. DVT? -Her 09/24/19 CT scan incidentally showed suspicion for b/l femoral vein DVT. She is not currently symptomatic and is still ambulating fairly well. Her last fall was 2 months ago.  -I discussed the stand treatment for DVT is anticoagulation, but this will put her at risk for bleeding. I discussed the risks and benefits with and without blood thinner. Will obtain Doppler of b/l LE to confirm DVT. Her children agreed   -I encouraged her to remain active, elevate feet and use compression socks.    2. Weight loss/insomnia/deconditioning  -Secondary to cancer she initially had 80 pound weight loss,early satiety, decreased appetite, and unable to stand up or sit down without assistance. She spends most of the day in the recliner. She has done PT.  -She lives at home with her 2 sons and has home health aide 5 days a week.  -She did not tolerate Mirtazapine and Megace was not helpful. Continue nutritional supplements and f/u with dietician.  -S/p SBRT her performance status improve, stable and fair. Her appetite is still low.  -Her memory continues to decline and she will occasionally become disoriented. I encouraged her and her family to avoid falls.    3.CKD -probably secondary to DM. Stable  4. Anemia -Previous anemia work-up showed high ferritin, low serum iron and TIBC, consistent with anemia of, secondary to underlying malignancy. -Resolved currently.    Plan: -Doppler next Monday and F/u afterward. If DVT is confirmed, will recommend eliquis 2.75m bid (with loading dose 560mbid for 7 days)  -monitor lab on eliquis  -Continue observation and palliative care     No problem-specific Assessment & Plan notes found for this encounter.   No orders of the defined types were  placed in this encounter.  I discussed the assessment and treatment plan with the patient. The patient was provided an opportunity to ask questions and all were answered. The patient agreed with the plan and demonstrated an understanding of the instructions.  The patient was advised to call back or seek an in-person evaluation if the symptoms worsen or if the condition fails to improve as anticipated.  The total time spent in the appointment was 25 minutes.    YaTruitt MerleMD 09/25/2019   I, AmJoslyn Devonam acting as scribe for YaTruitt MerleMD.   I have reviewed the above documentation for accuracy and completeness, and I agree with the above.

## 2019-09-28 ENCOUNTER — Inpatient Hospital Stay: Payer: Medicare Other | Attending: Physician Assistant | Admitting: Medical

## 2019-09-28 ENCOUNTER — Telehealth: Payer: Self-pay

## 2019-09-28 ENCOUNTER — Other Ambulatory Visit: Payer: Self-pay

## 2019-09-28 ENCOUNTER — Ambulatory Visit (HOSPITAL_COMMUNITY)
Admission: RE | Admit: 2019-09-28 | Discharge: 2019-09-28 | Disposition: A | Discharge status: Home / Self Care | Payer: Medicare Other | Source: Ambulatory Visit | Attending: Hematology | Admitting: Hematology

## 2019-09-28 VITALS — BP 96/59 | HR 118 | Temp 96.0°F | Resp 18 | Ht 65.0 in | Wt 124.3 lb

## 2019-09-28 DIAGNOSIS — R112 Nausea with vomiting, unspecified: Secondary | ICD-10-CM | POA: Insufficient documentation

## 2019-09-28 DIAGNOSIS — C241 Malignant neoplasm of ampulla of Vater: Secondary | ICD-10-CM | POA: Insufficient documentation

## 2019-09-28 DIAGNOSIS — I82413 Acute embolism and thrombosis of femoral vein, bilateral: Secondary | ICD-10-CM

## 2019-09-28 MED ORDER — APIXABAN 2.5 MG PO TABS: ORAL_TABLET | ORAL | 0 refills | Status: AC

## 2019-09-28 NOTE — Progress Notes (Signed)
Bilateral lower extremity venous duplex has been completed. Preliminary results can be found in CV Proc through chart review.  Results were given to Santiago Glad at Dr. Ernestina Penna office.  09/28/19 12:54 PM Carlos Levering RVT

## 2019-09-28 NOTE — Telephone Encounter (Signed)
I received a call from vascular ultrasound.  [name] is + for bilateral DVTs.  Pt scheduled to see Sandi Mealy, PA-C this afternoon.

## 2019-09-29 NOTE — Progress Notes (Signed)
Debra [name] was seen in the clinic today with her daughter.  She reports that she has not been feeling well recently.  She had a bad headache on Friday.  She was referred for a venous Doppler ultrasound today which returned showing:  RIGHT:  - Findings consistent with age indeterminate deep vein thrombosis involving the right common femoral vein, right femoral vein, right popliteal vein, and right peroneal veins.  - No cystic structure found in the popliteal fossa.    LEFT:  - Findings consistent with age indeterminate deep vein thrombosis involving the left common femoral vein, left femoral vein, left popliteal vein, and left peroneal veins.  - No cystic structure found in the popliteal fossa.   The patient has no other symptoms.  She had been placed on Megace although her daughter states that it had not made any major impact on her anorexia.  She was told to stop the Megace completely and was placed on Eliquis 5 mg p.o. twice daily x7 days followed by 2.5 mg p.o. twice daily.  The patient daughter were told to contact her office approximately 1 week prior to completion of their first month of Eliquis at which time she would be placed on ongoing maintenance therapy.  Sandi Mealy, MHS, PA-C Physician Assistant

## 2019-09-30 DIAGNOSIS — C241 Malignant neoplasm of ampulla of Vater: Secondary | ICD-10-CM | POA: Diagnosis not present

## 2019-09-30 DIAGNOSIS — M109 Gout, unspecified: Secondary | ICD-10-CM | POA: Diagnosis not present

## 2019-09-30 DIAGNOSIS — I82413 Acute embolism and thrombosis of femoral vein, bilateral: Secondary | ICD-10-CM | POA: Diagnosis not present

## 2019-09-30 DIAGNOSIS — E1122 Type 2 diabetes mellitus with diabetic chronic kidney disease: Secondary | ICD-10-CM | POA: Diagnosis not present

## 2019-10-01 ENCOUNTER — Telehealth: Payer: Self-pay

## 2019-10-01 NOTE — Telephone Encounter (Signed)
Received a request for wheelchair.  Prescription faxed to [facility] at [phone]

## 2019-10-06 DIAGNOSIS — R260 Ataxic gait: Secondary | ICD-10-CM | POA: Diagnosis not present

## 2019-10-06 DIAGNOSIS — M6281 Muscle weakness (generalized): Secondary | ICD-10-CM | POA: Diagnosis not present

## 2019-10-06 DIAGNOSIS — C259 Malignant neoplasm of pancreas, unspecified: Secondary | ICD-10-CM | POA: Diagnosis not present

## 2019-10-08 DIAGNOSIS — E43 Unspecified severe protein-calorie malnutrition: Secondary | ICD-10-CM | POA: Diagnosis not present

## 2019-10-08 DIAGNOSIS — Z515 Encounter for palliative care: Secondary | ICD-10-CM | POA: Diagnosis not present

## 2019-10-08 DIAGNOSIS — C241 Malignant neoplasm of ampulla of Vater: Secondary | ICD-10-CM | POA: Diagnosis not present

## 2019-10-10 ENCOUNTER — Other Ambulatory Visit: Payer: Self-pay

## 2019-10-10 ENCOUNTER — Emergency Department (HOSPITAL_BASED_OUTPATIENT_CLINIC_OR_DEPARTMENT_OTHER)
Admission: EM | Admit: 2019-10-10 | Discharge: 2019-10-10 | Disposition: A | Discharge status: Home / Self Care | Payer: Medicare Other | Attending: Emergency Medicine | Admitting: Emergency Medicine

## 2019-10-10 DIAGNOSIS — F0390 Unspecified dementia without behavioral disturbance: Secondary | ICD-10-CM | POA: Diagnosis not present

## 2019-10-10 DIAGNOSIS — E119 Type 2 diabetes mellitus without complications: Secondary | ICD-10-CM | POA: Diagnosis not present

## 2019-10-10 DIAGNOSIS — C241 Malignant neoplasm of ampulla of Vater: Secondary | ICD-10-CM

## 2019-10-10 DIAGNOSIS — Z7901 Long term (current) use of anticoagulants: Secondary | ICD-10-CM | POA: Diagnosis not present

## 2019-10-10 DIAGNOSIS — Z96652 Presence of left artificial knee joint: Secondary | ICD-10-CM | POA: Diagnosis not present

## 2019-10-10 DIAGNOSIS — R000 Tachycardia, unspecified: Secondary | ICD-10-CM | POA: Diagnosis not present

## 2019-10-10 DIAGNOSIS — N189 Chronic kidney disease, unspecified: Secondary | ICD-10-CM | POA: Diagnosis not present

## 2019-10-10 DIAGNOSIS — R1310 Dysphagia, unspecified: Secondary | ICD-10-CM

## 2019-10-10 DIAGNOSIS — I129 Hypertensive chronic kidney disease with stage 1 through stage 4 chronic kidney disease, or unspecified chronic kidney disease: Secondary | ICD-10-CM | POA: Diagnosis not present

## 2019-10-10 DIAGNOSIS — Z7984 Long term (current) use of oral hypoglycemic drugs: Secondary | ICD-10-CM | POA: Insufficient documentation

## 2019-10-10 DIAGNOSIS — Z955 Presence of coronary angioplasty implant and graft: Secondary | ICD-10-CM | POA: Insufficient documentation

## 2019-10-10 LAB — CBC WITH DIFFERENTIAL/PLATELET
Abs Immature Granulocytes: 0.03 10*3/uL (ref 0.00–0.07)
Basophils Absolute: 0 10*3/uL (ref 0.0–0.1)
Basophils Relative: 0 %
Eosinophils Absolute: 0 10*3/uL (ref 0.0–0.5)
Eosinophils Relative: 0 %
HCT: 33 % — ABNORMAL LOW (ref 36.0–46.0)
Hemoglobin: 11 g/dL — ABNORMAL LOW (ref 12.0–15.0)
Immature Granulocytes: 0 %
Lymphocytes Relative: 20 %
Lymphs Abs: 2.1 10*3/uL (ref 0.7–4.0)
MCH: 31.3 pg (ref 26.0–34.0)
MCHC: 33.3 g/dL (ref 30.0–36.0)
MCV: 93.8 fL (ref 80.0–100.0)
Monocytes Absolute: 0.7 10*3/uL (ref 0.1–1.0)
Monocytes Relative: 7 %
Neutro Abs: 7.5 10*3/uL (ref 1.7–7.7)
Neutrophils Relative %: 73 %
Platelets: 601 10*3/uL — ABNORMAL HIGH (ref 150–400)
RBC: 3.52 MIL/uL — ABNORMAL LOW (ref 3.87–5.11)
RDW: 15.1 % (ref 11.5–15.5)
WBC: 10.4 10*3/uL (ref 4.0–10.5)
nRBC: 0 % (ref 0.0–0.2)

## 2019-10-10 LAB — COMPREHENSIVE METABOLIC PANEL
ALT: 15 U/L (ref 0–44)
AST: 20 U/L (ref 15–41)
Albumin: 2.9 g/dL — ABNORMAL LOW (ref 3.5–5.0)
Alkaline Phosphatase: 50 U/L (ref 38–126)
Anion gap: 19 — ABNORMAL HIGH (ref 5–15)
BUN: 52 mg/dL — ABNORMAL HIGH (ref 8–23)
CO2: 18 mmol/L — ABNORMAL LOW (ref 22–32)
Calcium: 9.6 mg/dL (ref 8.9–10.3)
Chloride: 98 mmol/L (ref 98–111)
Creatinine, Ser: 1.16 mg/dL — ABNORMAL HIGH (ref 0.44–1.00)
GFR calc Af Amer: 49 mL/min — ABNORMAL LOW (ref >60)
GFR calc non Af Amer: 42 mL/min — ABNORMAL LOW (ref >60)
Glucose, Bld: 188 mg/dL — ABNORMAL HIGH (ref 70–99)
Potassium: 5 mmol/L (ref 3.5–5.1)
Sodium: 135 mmol/L (ref 135–145)
Total Bilirubin: 0.5 mg/dL (ref 0.3–1.2)
Total Protein: 7 g/dL (ref 6.5–8.1)

## 2019-10-10 MED ORDER — SODIUM CHLORIDE 0.9 % IV BOLUS
1000.0000 mL | Freq: Once | INTRAVENOUS | Status: AC
  Administered 2019-10-10: 1000 mL via INTRAVENOUS

## 2019-10-10 NOTE — ED Notes (Signed)
Debra [name] at [facility] regarding Oncology consult

## 2019-10-10 NOTE — ED Provider Notes (Signed)
Debra [name]   CSN: [id number] Arrival date & time: 10/10/19  1349     History Chief Complaint  Patient presents with  . Dysphagia    Debra [name] is a 86 y.o. 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 She states she has been on a liquid diet since earlier this  Month after CT imaging noted increased size of mass at her pancreatic head. Her daughter, who cares for her, states she has been having more drooling and spitting, though is able to swallow the liquids most of the time. She has not been complaining of abdomina pain or chest pain. She has not had any fevers or change in mental status.   LEVEL 5 CAVEAT 2/t DEMENTIA  The history is provided by a relative. The history is limited by the condition of the patient.       Past Medical History:  Diagnosis Date  . CKD (chronic kidney disease)   . Diabetes mellitus without complication ([place])   . Gout   . Hypercholesteremia   . Hypertension   . 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 Malignant neoplasm of head of pancreas ([place])   . Pain in the abdomen 08/15/2019  . Duodenal obstruction 08/15/2019  . High serum lactate 08/15/2019  . Thrombocytosis ([place]) 08/15/2019  . Goals of care, counseling/discussion 05/29/2019  . 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 Protein-calorie malnutrition, severe 05/20/2019  . Acute cholangitis 05/18/2019  . Unintentional weight loss 05/18/2019  . Slurred speech 05/18/2019  . HLD (hyperlipidemia) 05/18/2019  . HTN (hypertension) 05/18/2019  . DM (diabetes mellitus), type 2 ([place]) 05/18/2019  . Hypokalemia 05/18/2019  . TIA (transient ischemic attack) 05/18/2019  . Elevated bilirubin   . Transaminitis   . Gallstone pancreatitis   . Choledocholithiasis   . Dilated  cbd, acquired   . Pancreatic duct dilated   . Lactic acidosis   . Poor appetite 04/13/2019  . Dilation of biliary tract 04/13/2019  . Abnormal CT scan, gallbladder 04/13/2019  . Abnormal weight loss 04/13/2019    Past Surgical History:  Procedure Laterality Date  . BILIARY STENT PLACEMENT  05/20/2019   Procedure: BILIARY STENT PLACEMENT;  Surgeon: Milus Banister, MD;  Location: [facility] ENDOSCOPY;  Service: Endoscopy;;  . BIOPSY  05/20/2019   Procedure: BIOPSY;  Surgeon: Milus Banister, MD;  Location[facility] ENDOSCOPY;  Service: Endoscopy;;  . DUODENAL STENT PLACEMENT N/A 08/17/2019   Procedure: DUODENAL STENT PLACEMENT;  Surgeon: Milus Banister, MD;  Location: [facility] ENDOSCOPY;  Service: Endoscopy;  Laterality: N/A;  . ERCP N/A 05/20/2019   Procedure: ENDOSCOPIC RETROGRADE CHOLANGIOPANCREATOGRAPHY (ERCP);  Surgeon: Milus Banister, MD;  Location: [facility] ENDOSCOPY;  Service: Endoscopy;  Laterality: N/A;  . ESOPHAGOGASTRODUODENOSCOPY (EGD) WITH PROPOFOL N/A 08/17/2019   Procedure: ESOPHAGOGASTRODUODENOSCOPY (EGD) WITH PROPOFOL;  Surgeon: Milus Banister, MD;  Location: [facility] ENDOSCOPY;  Service: Endoscopy;  Laterality: N/A;  . TOTAL KNEE ARTHROPLASTY Left      OB History    Gravida  5   Para  5   Term  5   Preterm      AB      Living        SAB      TAB      Ectopic  Multiple      Live Births              Family History  Problem Relation Age of Onset  . Diabetes Mother   . Hypertension Father   . Breast cancer Niece   . Colon cancer Neg Hx     Social History   Tobacco Use  . Smoking status: Never Smoker  . Smokeless tobacco: Never Used  Substance Use Topics  . Alcohol use: No  . Drug use: No    Home Medications Prior to Admission medications   Medication Sig Start Date End Date Taking? Authorizing Provider  ACCU-CHEK GUIDE test strip 2 (two) times daily. use for testing 05/23/19   [provider]  Accu-Chek Softclix Lancets lancets USE TWICE DAILY with  strips 05/23/19   [provider]  acetaminophen (TYLENOL) 325 MG tablet Take 325 mg by mouth every 6 (six) hours as needed for mild pain, moderate pain, fever or headache.    [provider]  apixaban (ELIQUIS) 2.5 MG TABS tablet 2 tablets (5 mg total) BID for 1 week then decrease to 1 tablet (2.5 mg total)  BID. Call within 7 days of completing this prescription for a refill of 2.5 mg BID. 09/28/19   Tanner, Lyndon Code., PA-C  Blood Glucose Monitoring Suppl (ACCU-CHEK GUIDE) w/Device KIT USE AS DIRECTED TO test TWICE DAILY 05/23/19   [provider]  feeding supplement, GLUCERNA SHAKE, (GLUCERNA SHAKE) LIQD Take 237 mLs by mouth 3 (three) times daily between meals. 08/20/19   Debbe Odea, MD  latanoprost (XALATAN) 0.005 % ophthalmic solution Place 1 drop into both eyes at bedtime.     [provider]  metFORMIN (GLUCOPHAGE-XR) 500 MG 24 hr tablet Take 500 mg by mouth daily with breakfast.     [provider]  omeprazole (PRILOSEC) 20 MG capsule Take 1 capsule (20 mg total) by mouth daily. 07/29/19   Truitt Merle, MD  potassium chloride (KLOR-CON) 10 MEQ tablet Take 1 tablet (10 mEq total) by mouth daily. 07/29/19   Truitt Merle, MD  senna (SENOKOT) 8.6 MG TABS tablet Take 1 tablet (8.6 mg total) by mouth at bedtime as needed for mild constipation. 05/23/19   Regalado, Belkys A, MD  triamcinolone (KENALOG) 0.025 % cream Apply 1 application topically 2 (two) times daily as needed (Rash).  03/23/19   [provider]    Allergies    Other  Review of Systems   Review of Systems  Unable to perform ROS: Dementia    Physical Exam Updated Vital Signs BP 120/70   Pulse (!) 110   Temp 98 F (36.7 C) (Oral)   Resp 18   SpO2 100%   Physical Exam Vitals and nursing [name] reviewed.  Constitutional:      General: She is not in acute distress.    Appearance: She is well-developed.     Comments: Pleasantly confused  HENT:     Head: Normocephalic and atraumatic.    Eyes:     Conjunctiva/sclera: Conjunctivae normal.  Cardiovascular:     Rate and Rhythm: Regular rhythm. Tachycardia present.  Pulmonary:     Effort: Pulmonary effort is normal. No respiratory distress.     Breath sounds: Normal breath sounds.  Abdominal:     General: Bowel sounds are normal.     Palpations: Abdomen is soft.     Tenderness: There is no abdominal tenderness. There is no guarding or rebound.     Comments: Pt is not  drooling  Skin:    General: Skin is warm.  Neurological:     Mental Status: She is alert.  Psychiatric:        Behavior: Behavior normal.     ED Results / Procedures / Treatments   Labs (all labs ordered are listed, but only abnormal results are displayed) Labs Reviewed  COMPREHENSIVE METABOLIC PANEL - Abnormal; Notable for the following components:      Result Value   CO2 18 (*)    Glucose, Bld 188 (*)    BUN 52 (*)    Creatinine, Ser 1.16 (*)    Albumin 2.9 (*)    GFR calc non Af Amer 42 (*)    GFR calc Af Amer 49 (*)    Anion gap 19 (*)    All other components within normal limits  CBC WITH DIFFERENTIAL/PLATELET - Abnormal; Notable for the following components:   RBC 3.52 (*)    Hemoglobin 11.0 (*)    HCT 33.0 (*)    Platelets 601 (*)    All other components within normal limits    EKG EKG Interpretation  Date/Time:  Saturday October 10 2019 14:40:15 EDT Ventricular Rate:  124 PR Interval:    QRS Duration: 99 QT Interval:  297 QTC Calculation: 427 R Axis:   -55 Text Interpretation: Sinus tachycardia LAD, consider left anterior fascicular block Abnormal R-wave progression, late transition ST elevation, consider inferior injury No significant change since last tracing Confirmed by Isla Pence [phone]) on 10/10/2019 3:23:17 PM   Radiology No results found.  Procedures Procedures (including critical care time)  Medications Ordered in ED Medications  sodium chloride 0.9 % bolus 1,000 mL ( Intravenous Stopped 10/10/19 1659)     ED Course  I have reviewed the triage vital signs and the nursing notes.  Pertinent labs & imaging results that were available during my care of the patient were reviewed by me and considered in my medical decision making (see chart for details).  Clinical Course as of Oct 10 1734  Sat Oct 10, 2019  1621 Consulted with Dr. Marin Olp with oncology.    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 Burr Medico.  Per chart review from last visit on 09/25/2019 with Dr. Burr Medico, they discussed CT scan done on 09/24/2019 which showed increasing size of mass at the pancreatic head.  It appears hospice care was discussed at that time, however patient did not feel ready.  Palliative care was continued.  She is brought in today to the ED for problems swallowing.  She has been on a liquid diet.  Her daughter, who provides her care, states the liquids are going down though she is sometimes having difficulty swallowing them with some intermittent drooling.  She is also been treating DVT with Eliquis, patient's daughter states she has not missed any doses.  She is at at her mental baseline regarding dementia.  On exam, she is not in acute distress, pleasantly confused.  Abdomen is soft and nontender.  She is not actively drooling or spitting.  She is noted to be tachycardic on initial evaluation though was quite worked up with being hooked up to the monitors and with multiple people in the room.  She is  given IV fluids, labs ordered.  Heart rate is improving on reevaluation, was similarly elevated during her last multiple visits both outpatient and in the ED.  Low suspicion for PE as she has been compliant with Eliquis and is not having any respiratory symptoms or distress.  No leukocytosis or leukopenia, metabolic panel with some signs of mild dehydration.  Had shared  decision making with patient's daughter regarding goals of care. Mass in the abdomen is likely continuing to grow and causing symptoms today. Discussed feeding tube and that this may prolong life and likely require admission today versus supportive measures and hospice consult.  Consulted with Dr. Marin Olp with oncology who recommends avoiding feeding tube at this time.  Patient can call office first thing Monday morning for close outpatient follow-up.  Consult placed to our case management team for any assistance he may be able to provide with establishing home hospice care.  Patient's daughter is not interested in residential hospice care at this time.  Discussed supportive measures at home, importance of follow-up with her oncologist.  Patient discharged.  Patient discussed with and evaluated by Dr. Gilford Raid, agrees with work-up and care plan at this time.  Final Clinical Impression(s) / ED Diagnoses Final diagnoses:  Ampullary carcinoma ([place])  Dysphagia, unspecified type    Rx / DC Orders ED Discharge Orders         Ordered    Amb Referral to Palliative Care        10/10/19 Webster, [place] N, PA-C 10/10/19 1738    Isla Pence, MD 10/11/19 [phone]

## 2019-10-10 NOTE — ED Triage Notes (Signed)
Pt taken to room 11 for triage. Daughter reports pt is on blood thinner x 2 weeks for bilateral DVT. She has been having trouble swallowing x 2 days, daughter reports pt drooling and spitting frequently. Pt alert, confused, difficult to direct, does not follow commands. Assisted her to [facility] commode (max assist from 2 ED staff) but pt did not void.

## 2019-10-10 NOTE — Discharge Instructions (Signed)
She can continue drinking liquids as tolerated. Schedule a close follow up appointment with Dr. Burr Medico on Monday. We have asked our case management team to assist with setting up home hospice care. Please discuss this further with your oncologist if you have not been contacted on Monday.

## 2019-10-10 NOTE — ED Notes (Signed)
ED Provider at bedside. 

## 2019-10-12 ENCOUNTER — Inpatient Hospital Stay: Payer: Medicare Other

## 2019-10-12 ENCOUNTER — Other Ambulatory Visit: Payer: Self-pay

## 2019-10-12 ENCOUNTER — Inpatient Hospital Stay (HOSPITAL_BASED_OUTPATIENT_CLINIC_OR_DEPARTMENT_OTHER): Payer: Medicare Other | Admitting: Hematology

## 2019-10-12 ENCOUNTER — Encounter: Payer: Self-pay | Admitting: Hematology

## 2019-10-12 VITALS — BP 86/61 | HR 120 | Temp 98.2°F | Resp 18 | Ht 65.0 in

## 2019-10-12 DIAGNOSIS — R112 Nausea with vomiting, unspecified: Secondary | ICD-10-CM | POA: Diagnosis not present

## 2019-10-12 DIAGNOSIS — C241 Malignant neoplasm of ampulla of Vater: Secondary | ICD-10-CM

## 2019-10-12 MED ORDER — SODIUM CHLORIDE 0.9 % IV SOLN: Freq: Once | INTRAVENOUS | Status: DC

## 2019-10-12 MED ORDER — DEXAMETHASONE SODIUM PHOSPHATE 10 MG/ML IJ SOLN
8.0000 mg | Freq: Once | INTRAMUSCULAR | Status: AC
  Administered 2019-10-12: 8 mg via INTRAVENOUS

## 2019-10-12 MED ORDER — MORPHINE SULFATE (PF) 2 MG/ML IV SOLN: 2.0000 mg | Freq: Once | INTRAVENOUS | Status: DC

## 2019-10-12 MED ORDER — ONDANSETRON HCL 4 MG/2ML IJ SOLN
8.0000 mg | Freq: Once | INTRAMUSCULAR | Status: AC
  Administered 2019-10-12: 8 mg via INTRAVENOUS

## 2019-10-12 MED ORDER — MORPHINE SULFATE (PF) 2 MG/ML IV SOLN
INTRAVENOUS | Status: AC
  Filled 2019-10-12: qty 1

## 2019-10-12 MED ORDER — ONDANSETRON HCL 4 MG/2ML IJ SOLN
INTRAMUSCULAR | Status: AC
  Filled 2019-10-12: qty 4

## 2019-10-12 MED ORDER — SODIUM CHLORIDE 0.9 % IV SOLN
INTRAVENOUS | Status: AC
  Filled 2019-10-12: qty 250

## 2019-10-12 MED ORDER — DEXAMETHASONE SODIUM PHOSPHATE 10 MG/ML IJ SOLN
INTRAMUSCULAR | Status: AC
  Filled 2019-10-12: qty 1

## 2019-10-12 NOTE — Patient Instructions (Signed)

## 2019-10-12 NOTE — Progress Notes (Signed)
[place]   [phone] [phone]   Clinic Follow up Note   Patient Care Team: Asencion Noble, MD as PCP - General (Internal Medicine) Gala Romney, Cristopher Estimable, MD as Consulting Physician (Gastroenterology) Jonnie Finner, RN as Oncology Nurse Navigator Truitt Merle, MD as Consulting Physician (Hematology) Heilingoetter, Tobe Sos, PA-C as Physician Assistant (Physician Assistant)  CHIEF COMPLAINT: nausea, vomiting and poor oral intake   SUMMARY OF ONCOLOGIC HISTORY: Oncology History Overview Note  Cancer Staging Ampullary carcinoma [facility]) Staging form: Small Intestine - Adenocarcinoma, AJCC 8th Edition - Clinical stage from 05/20/2019: Stage Unknown (cTX, cN0, cM0) - Signed by Truitt Merle, MD on 06/30/2019    Ampullary carcinoma ([place])  03/20/2019 Imaging   1. Dilated gallbladder with cholelithiasis and mild wall thickening. This is suspicious for cholecystitis. Recommend clinical correlation, and consider nuclear medicine hepatobiliary scan for further evaluation. 2. Diffuse biliary ductal dilatation, with common bile duct measuring 14 mm in diameter. Recommend correlation with liver function tests, and consider MRCP or ERCP for further evaluation.   05/18/2019 Imaging   Profound intra and extrahepatic biliary ductal dilatation in addition to dilatation of the pancreatic duct, slightly increased compared to prior examination dated 03/20/2019 and highly concerning for a small ampullary or pancreatic malignancy which is not directly visualized.   05/20/2019 Initial Diagnosis   Ampullary carcinoma ([place])   05/20/2019 Procedure   ERCP under the care of Dr. 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 This was biopsied extensively. - The mass causes a short, abrupt distal CBD stricture which was stented with a 4cm long 48mm diameter uncovered metal mesh stent.   05/20/2019 Pathology Results   SURGICAL  PATHOLOGY [id number]   A. AMPULLARY, BIOPSY: - Poorly differentiated adenocarcinoma   05/20/2019 Cancer Staging   Staging form: Small Intestine - Adenocarcinoma, AJCC 8th Edition - Clinical stage from 05/20/2019: Stage Unknown (cTX, cN0, cM0) - Signed by Truitt Merle, MD on 06/30/2019   06/03/2019 Imaging   CT Chest wo Contrast  IMPRESSION: 1. No evidence of metastatic disease within the chest, study mildly limited by respiratory motion at the lung bases. 2. Dilated central pulmonary vasculature up to 3.6 cm, likely reflects pulmonary arterial hypertension 3. Three-vessel coronary artery calcification. 4. Aortic atherosclerosis.   Aortic Atherosclerosis (ICD10-I70.0).   06/17/2019 - 06/26/2019 Radiation Therapy   SBRT with Dr Lisbeth Renshaw 06/17/19-06/26/19.    08/15/2019 Imaging   CT AP w contrast IMPRESSION: 1. Ill-defined low-density mass at the region of the pancreatic head and ampulla measuring up to 3.0 cm compatible with known biopsy-proven ampullary carcinoma. Findings suggest invasion/obstruction of the duodenal lumen at this level. The stomach and proximal duodenum are markedly dilated upstream from the level of the ampullary tumor. Findings compatible with mechanical outlet obstruction. 2. Interval placement of biliary stent within the common bile duct. Intrahepatic biliary dilatation with postprocedural pneumobilia. 3. Cholelithiasis. 4. Aortic atherosclerosis. (ICD10-I70.0).   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 This was stented with a 6cm long 51mm diameter uncovered metal Wallstent in apparent good position via fluoroscopy.   09/24/2019 Imaging   CT head  IMPRESSION: 1. No acute intracranial findings. 2. Atrophy and white matter microvascular disease. 3. Mild hydrocephalus proportional to atrophy.     09/24/2019 Imaging   CT AP w contrast  IMPRESSION:  1. Findings suspicious for bilateral lower extremity deep vein thrombosis as detailed above. A dedicated bilateral lower extremity ultrasound is recommended for further evaluation. 2. Growing mass at the head of the pancreas at the level of the ampulla of Vater. There are new hypoattenuating right hepatic lobe lesions concerning for interval development of metastatic disease. 3. Patent common bile duct stent. 4. There is cholelithiasis without secondary signs of acute cholecystitis. 5. Grossly patent duodenal stent without evidence for a bowel obstruction.   Aortic Atherosclerosis (ICD10-I70.0).      CURRENT THERAPY:  Palliative supportive care   INTERVAL HISTORY:  Debra [name] is scheduled for an urgent visit after her ED visit two days ago.  She developed hiccups since Friday, and has been having persistent nausea and vomiting, spitting saliva, and not able to tolerate much soft or liquid diet.  She denies fever or chills, does have intermittent abdominal pain.  She was morning in wheelchair when I saw her.  History most from her daughters. She has been declining quickly in the past few weeks.  She needs assistance for all daily living, and spends most time in couch or bed.  She was found to be borderline hypotensive when she checked in, and was not able to stand up to weight herself.  MEDICAL HISTORY:  Past Medical History:  Diagnosis Date  . CKD (chronic kidney disease)   . Diabetes mellitus without complication ([place])   . Gout   . Hypercholesteremia   . Hypertension   . 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 BILIARY STENT PLACEMENT  05/20/2019   Procedure: BILIARY STENT PLACEMENT;  Surgeon: Milus Banister, MD;  Location[facility] ENDOSCOPY;  Service: Endoscopy;;  . BIOPSY  05/20/2019   Procedure: BIOPSY;  Surgeon: Milus Banister, MD;   Location: [facility] ENDOSCOPY;  Service: Endoscopy;;  . DUODENAL STENT PLACEMENT N/A 08/17/2019   Procedure: DUODENAL STENT PLACEMENT;  Surgeon: Milus Banister, MD;  Location: [facility] ENDOSCOPY;  Service: Endoscopy;  Laterality: N/A;  . ERCP N/A 05/20/2019   Procedure: ENDOSCOPIC RETROGRADE CHOLANGIOPANCREATOGRAPHY (ERCP);  Surgeon: Milus Banister, MD;  Location: [facility] ENDOSCOPY;  Service: Endoscopy;  Laterality: N/A;  . ESOPHAGOGASTRODUODENOSCOPY (EGD) WITH PROPOFOL N/A 08/17/2019   Procedure: ESOPHAGOGASTRODUODENOSCOPY (EGD) WITH PROPOFOL;  Surgeon: Milus Banister, MD;  Location: [facility] ENDOSCOPY;  Service: Endoscopy;  Laterality: N/A;  . TOTAL KNEE ARTHROPLASTY Left     I have reviewed the social history and family history with the patient and they are unchanged from previous note.  ALLERGIES:  is allergic to other.  MEDICATIONS:  Current Outpatient Medications  Medication Sig Dispense Refill  . ACCU-CHEK GUIDE test strip 2 (two) times daily. use for testing    . Accu-Chek Softclix Lancets lancets USE TWICE DAILY with strips    . acetaminophen (TYLENOL) 325 MG tablet Take 325 mg by mouth every 6 (six) hours as needed for mild pain, moderate pain, fever or headache.    Marland Kitchen apixaban (ELIQUIS) 2.5 MG TABS tablet 2 tablets (5 mg total) BID for 1 week then decrease to 1 tablet (2.5 mg total)  BID. Call within 7 days of completing this prescription for a refill of 2.5 mg BID. 74 tablet 0  . Blood Glucose Monitoring Suppl (ACCU-CHEK GUIDE) w/Device KIT USE AS DIRECTED TO test TWICE DAILY    . feeding supplement, GLUCERNA SHAKE, (GLUCERNA SHAKE) LIQD Take 237 mLs by mouth 3 (three)  times daily between meals. 90 mL 0  . latanoprost (XALATAN) 0.005 % ophthalmic solution Place 1 drop into both eyes at bedtime.     . metFORMIN (GLUCOPHAGE-XR) 500 MG 24 hr tablet Take 500 mg by mouth daily with breakfast.     . omeprazole (PRILOSEC) 20 MG capsule Take 1 capsule (20 mg total) by mouth daily. 30 capsule 3  . potassium  chloride (KLOR-CON) 10 MEQ tablet Take 1 tablet (10 mEq total) by mouth daily. 15 tablet 0  . senna (SENOKOT) 8.6 MG TABS tablet Take 1 tablet (8.6 mg total) by mouth at bedtime as needed for mild constipation. 120 tablet 0  . triamcinolone (KENALOG) 0.025 % cream Apply 1 application topically 2 (two) times daily as needed (Rash).      No current facility-administered medications for this visit.    PHYSICAL EXAMINATION: ECOG PERFORMANCE STATUS: 3  BP (!) 86/61 (BP Location: Left Arm, Patient Position: Sitting) Comment: Santiago Glad RN notified  Pulse (!) 120 Comment: Delia Chimes notified  Temp 98.2 F (36.8 C) (Tympanic)   Resp 18   Ht $R'5\' 5"'fm$  (1.651 m)   SpO2 100%   BMI 20.68 kg/m   Elderly, frail African-American, sitting in wheelchair and moaning  No skin rash, no jaundice  Lung sounds clear Heart is regular, tacky Abdomen soft, nontender No leg edema   LABORATORY DATA:  I have reviewed the data as listed CBC Latest Ref Rng & Units 10/10/2019 09/24/2019 08/19/2019  WBC 4.0 - 10.5 K/uL 10.4 10.6(H) 6.9  Hemoglobin 12.0 - 15.0 g/dL 11.0(L) 12.1 10.9(L)  Hematocrit 36 - 46 % 33.0(L) 35.7(L) 32.9(L)  Platelets 150 - 400 K/uL 601(H) 545(H) 373     CMP Latest Ref Rng & Units 10/10/2019 09/24/2019 08/20/2019  Glucose 70 - 99 mg/dL 188(H) 108(H) 114(H)  BUN 8 - 23 mg/dL 52(H) 28(H) <5(L)  Creatinine 0.44 - 1.00 mg/dL 1.16(H) 1.09(H) 0.82  Sodium 135 - 145 mmol/L 135 130(L) 132(L)  Potassium 3.5 - 5.1 mmol/L 5.0 4.6 3.8  Chloride 98 - 111 mmol/L 98 99 101  CO2 22 - 32 mmol/L 18(L) 19(L) 22  Calcium 8.9 - 10.3 mg/dL 9.6 9.4 8.4(L)  Total Protein 6.5 - 8.1 g/dL 7.0 7.3 -  Total Bilirubin 0.3 - 1.2 mg/dL 0.5 0.4 -  Alkaline Phos 38 - 126 U/L 50 48 -  AST 15 - 41 U/L 20 15 -  ALT 0 - 44 U/L 15 9 -      RADIOGRAPHIC STUDIES: I have personally reviewed the radiological images as listed and agreed with the findings in the report. No results found.   ASSESSMENT & PLAN:  Debra [name] is  a 86 y.o. female with   1.  Persistent nausea and vomiting -probably secondary to gastric outlet obstruction from duodenal mass -I recommend transitioning her to from palliative care to hospice.  I spoke with her daughters and her son, they are in agreement. -We will give IV fluids, zofran and dexamethasone today for symptom management  2.Ampullary adenocarcinoma, cTxN0M0, s/p radiation  -She wasdiagnosed in5/2021.  -she is not a candidate for surgery -s/p SBRT with Dr Lisbeth Renshaw 06/17/19-06/26/19. -s/p duodenal stent placement -We will transition her to hospice due to worsening symptoms.   3. DVT -Her 09/24/19 CT scan incidentally showed suspicion for b/l femoral vein DVT. -she is on low dose Eliquis     Plan: -will give IVF, Zofran and a dexamethasone today -We will transition her from palliative care to home hospice, family  agrees. -I will see her as needed     No problem-specific Assessment & Plan notes found for this encounter.    The total time spent in the appointment was 35 minutes.    Truitt Merle, MD 10/12/2019   I, Joslyn Devon, am acting as scribe for Truitt Merle, MD.   I have reviewed the above documentation for accuracy and completeness, and I agree with the above.

## 2019-10-12 NOTE — Progress Notes (Signed)
I called [facility] and let them know the are opting for hospice services instead of palliative care.

## 2019-10-14 ENCOUNTER — Other Ambulatory Visit: Payer: Self-pay | Admitting: Hematology

## 2019-10-14 ENCOUNTER — Telehealth: Payer: Self-pay

## 2019-10-14 MED ORDER — MORPHINE SULFATE (CONCENTRATE) 20 MG/ML PO SOLN: 5.0000 mg | ORAL | 0 refills | Status: AC | PRN

## 2019-10-14 MED ORDER — LORAZEPAM 2 MG/ML PO CONC: 0.6000 mg | ORAL | 0 refills | Status: AC | PRN

## 2019-10-15 ENCOUNTER — Telehealth: Payer: Self-pay

## 2019-10-15 NOTE — Telephone Encounter (Signed)
Debra [name] called this morning to make Dr.Feng aware pt expired  last night at [phone]

## 2019-10-16 NOTE — Telephone Encounter (Signed)
Mal Amabile Rn called from [facility] requesting the following medications for Debra [name]:  Ativan liquid 2mg /ml 30 ml, 0.5 mg q 3 hours prn anxiety Morphine liquid 20mg /ml, 5 mg q 2 hours prn pain  Please send to [place] Forwarded to Dr. Burr Medico.

## 2019-10-16 DEATH — deceased

## 2019-10-28 ENCOUNTER — Ambulatory Visit: Payer: Medicare Other | Admitting: Nurse Practitioner

## 2019-10-28 ENCOUNTER — Other Ambulatory Visit: Payer: Medicare Other
